# Patient Record
Sex: Male | Born: 1945 | Race: Black or African American | Hispanic: No | Marital: Married | Smoking: Former smoker
Health system: Southern US, Community
[De-identification: ages and names within clinical notes are randomized; demographics above are authoritative.]

## PROBLEM LIST (undated history)

## (undated) DIAGNOSIS — N529 Male erectile dysfunction, unspecified: Secondary | ICD-10-CM

## (undated) DIAGNOSIS — B019 Varicella without complication: Secondary | ICD-10-CM

## (undated) DIAGNOSIS — N189 Chronic kidney disease, unspecified: Secondary | ICD-10-CM

## (undated) DIAGNOSIS — G4733 Obstructive sleep apnea (adult) (pediatric): Secondary | ICD-10-CM

## (undated) DIAGNOSIS — Z974 Presence of external hearing-aid: Secondary | ICD-10-CM

## (undated) DIAGNOSIS — G4730 Sleep apnea, unspecified: Secondary | ICD-10-CM

## (undated) DIAGNOSIS — I10 Essential (primary) hypertension: Secondary | ICD-10-CM

## (undated) HISTORY — DX: Sleep apnea, unspecified: G47.30

## (undated) HISTORY — DX: Varicella without complication: B01.9

## (undated) HISTORY — PX: KNEE SURGERY: SHX244

## (undated) HISTORY — PX: SHOULDER SURGERY: SHX246

## (undated) HISTORY — DX: Chronic kidney disease, unspecified: N18.9

## (undated) HISTORY — DX: Essential (primary) hypertension: I10

---

## 2010-11-15 LAB — HM COLONOSCOPY: HM Colonoscopy: NORMAL

## 2013-04-28 ENCOUNTER — Encounter: Payer: Self-pay | Admitting: Internal Medicine

## 2013-04-28 ENCOUNTER — Ambulatory Visit (INDEPENDENT_AMBULATORY_CARE_PROVIDER_SITE_OTHER): Payer: Medicare Other | Admitting: Internal Medicine

## 2013-04-28 VITALS — BP 134/88 | HR 63 | Temp 98.3°F | Ht 71.75 in | Wt 212.0 lb

## 2013-04-28 DIAGNOSIS — Z8669 Personal history of other diseases of the nervous system and sense organs: Secondary | ICD-10-CM | POA: Insufficient documentation

## 2013-04-28 DIAGNOSIS — N529 Male erectile dysfunction, unspecified: Secondary | ICD-10-CM

## 2013-04-28 DIAGNOSIS — G43909 Migraine, unspecified, not intractable, without status migrainosus: Secondary | ICD-10-CM | POA: Insufficient documentation

## 2013-04-28 NOTE — Progress Notes (Signed)
Pre-visit discussion using our clinic review tool. No additional management support is needed unless otherwise documented below in the visit note.  

## 2013-04-28 NOTE — Progress Notes (Signed)
HPI  Pt presents to the clinic today to establish care. He recently moved from [place] and is transferring from his PCP there. He has no concerns today.  Flu: 12/2012 Tetanus: unsure of date Pneumovax: never Zostovax: 2012 Colonoscopy: 2012 Eye doctor: yearly Dentist: biannually  Past Medical History  Diagnosis Date  . Chicken pox   . Migraine     Current Outpatient Prescriptions  Medication Sig Dispense Refill  . aspirin 81 MG tablet Take 81 mg by mouth daily.      . cholecalciferol (VITAMIN D) 1000 UNITS tablet Take 1,000 Units by mouth daily.      . SUMAtriptan (IMITREX) 100 MG tablet Take 100 mg by mouth every 2 (two) hours as needed for migraine or headache. May repeat in 2 hours if headache persists or recurs.      . vardenafil (LEVITRA) 20 MG tablet Take 20 mg by mouth daily as needed for erectile dysfunction. 1/2 - 1 tablet 1 hour PRN       No current facility-administered medications for this visit.    No Known Allergies  Family History  Problem Relation Age of Onset  . Arthritis Mother   . Cancer Mother   . Hypertension Mother   . Cancer Brother     History   Social History  . Marital Status: Married    Spouse Name: N/A    Number of Children: N/A  . Years of Education: N/A   Occupational History  . Not on file.   Social History Main Topics  . Smoking status: Former Research scientist (life sciences)  . Smokeless tobacco: Never Used     Comment: Quit 1978  . Alcohol Use: No  . Drug Use: No  . Sexual Activity: Not on file   Other Topics Concern  . Not on file   Social History Narrative  . No narrative on file    ROS:  Constitutional: Denies fever, malaise, fatigue, headache or abrupt weight changes.  Respiratory: Denies difficulty breathing, shortness of breath, cough or sputum production.   Cardiovascular: Denies chest pain, chest tightness, palpitations or swelling in the hands or feet.   No other specific complaints in a complete review of systems (except as listed  in HPI above).  PE:  BP 134/88  Pulse 63  Temp(Src) 98.3 F (36.8 C) (Oral)  Ht 5' 11.75" (1.822 m)  Wt 212 lb (96.163 kg)  BMI 28.97 kg/m2  SpO2 98% Wt Readings from Last 3 Encounters:  04/28/13 212 lb (96.163 kg)    General: Appears his stated age, well developed, well nourished in NAD. Cardiovascular: Normal rate and rhythm. S1,S2 noted.  No murmur, rubs or gallops noted. No JVD or BLE edema. No carotid bruits noted. Pulmonary/Chest: Normal effort and positive vesicular breath sounds. No respiratory distress. No wheezes, rales or ronchi noted.      Assessment and Plan:   RTC in 1 month for your physical

## 2013-04-28 NOTE — Patient Instructions (Signed)
Migraine Headache A migraine headache is an intense, throbbing pain on one or both sides of your head. A migraine can last for 30 minutes to several hours. CAUSES  The exact cause of a migraine headache is not always known. However, a migraine may be caused when nerves in the brain become irritated and release chemicals that cause inflammation. This causes pain. Certain things may also trigger migraines, such as:  Alcohol.  Smoking.  Stress.  Menstruation.  Aged cheeses.  Foods or drinks that contain nitrates, glutamate, aspartame, or tyramine.  Lack of sleep.  Chocolate.  Caffeine.  Hunger.  Physical exertion.  Fatigue.  Medicines used to treat chest pain (nitroglycerine), birth control pills, estrogen, and some blood pressure medicines. SIGNS AND SYMPTOMS  Pain on one or both sides of your head.  Pulsating or throbbing pain.  Severe pain that prevents daily activities.  Pain that is aggravated by any physical activity.  Nausea, vomiting, or both.  Dizziness.  Pain with exposure to bright lights, loud noises, or activity.  General sensitivity to bright lights, loud noises, or smells. Before you get a migraine, you may get warning signs that a migraine is coming (aura). An aura may include:  Seeing flashing lights.  Seeing bright spots, halos, or zig-zag lines.  Having tunnel vision or blurred vision.  Having feelings of numbness or tingling.  Having trouble talking.  Having muscle weakness. DIAGNOSIS  A migraine headache is often diagnosed based on:  Symptoms.  Physical exam.  A CT scan or MRI of your head. These imaging tests cannot diagnose migraines, but they can help rule out other causes of headaches. TREATMENT Medicines may be given for pain and nausea. Medicines can also be given to help prevent recurrent migraines.  HOME CARE INSTRUCTIONS  Only take over-the-counter or prescription medicines for pain or discomfort as directed by your  health care provider. The use of long-term narcotics is not recommended.  Lie down in a dark, quiet room when you have a migraine.  Keep a journal to find out what may trigger your migraine headaches. For example, write down:  What you eat and drink.  How much sleep you get.  Any change to your diet or medicines.  Limit alcohol consumption.  Quit smoking if you smoke.  Get 7 9 hours of sleep, or as recommended by your health care provider.  Limit stress.  Keep lights dim if bright lights bother you and make your migraines worse. SEEK IMMEDIATE MEDICAL CARE IF:   Your migraine becomes severe.  You have a fever.  You have a stiff neck.  You have vision loss.  You have muscular weakness or loss of muscle control.  You start losing your balance or have trouble walking.  You feel faint or pass out.  You have severe symptoms that are different from your first symptoms. MAKE SURE YOU:   Understand these instructions.  Will watch your condition.  Will get help right away if you are not doing well or get worse. Document Released: 03/30/2005 Document Revised: 01/18/2013 Document Reviewed: 12/05/2012 Exit[employer] Patient Information 2014 [employer].  

## 2013-04-28 NOTE — Assessment & Plan Note (Signed)
Well controlled with Imitrex

## 2013-04-28 NOTE — Assessment & Plan Note (Signed)
On Levitra Does cause headaches with use but resolves with aleve

## 2013-05-18 ENCOUNTER — Encounter: Payer: Self-pay | Admitting: Internal Medicine

## 2013-05-18 NOTE — Progress Notes (Signed)
Eye Exam 10/10/2012

## 2013-05-26 ENCOUNTER — Other Ambulatory Visit: Payer: Self-pay

## 2013-05-26 ENCOUNTER — Other Ambulatory Visit: Payer: Self-pay | Admitting: Internal Medicine

## 2013-05-26 MED ORDER — SUMATRIPTAN SUCCINATE 100 MG PO TABS: 100.0000 mg | ORAL_TABLET | ORAL | Status: DC | PRN

## 2013-05-26 NOTE — Telephone Encounter (Signed)
rx approved

## 2013-05-26 NOTE — Telephone Encounter (Signed)
Left detailed message on VM letting pt know Rx was sent to [facility]

## 2013-05-26 NOTE — Telephone Encounter (Signed)
Pt left v/m; pt said [facility] was to request refill sumatriptan on 05/23/13 with no response; I do not see request in system. Pt is going out of town 05/28/13 and request med refilled today. Pt request cb when done.

## 2013-06-15 ENCOUNTER — Encounter: Payer: Self-pay | Admitting: Family Medicine

## 2013-06-15 ENCOUNTER — Ambulatory Visit (INDEPENDENT_AMBULATORY_CARE_PROVIDER_SITE_OTHER): Payer: Medicare Other | Admitting: Family Medicine

## 2013-06-15 ENCOUNTER — Telehealth: Payer: Self-pay

## 2013-06-15 VITALS — BP 150/108 | HR 55 | Temp 98.2°F | Ht 71.75 in | Wt 216.5 lb

## 2013-06-15 DIAGNOSIS — I10 Essential (primary) hypertension: Secondary | ICD-10-CM

## 2013-06-15 DIAGNOSIS — R809 Proteinuria, unspecified: Secondary | ICD-10-CM | POA: Insufficient documentation

## 2013-06-15 LAB — POCT URINALYSIS DIPSTICK
BILIRUBIN UA: NEGATIVE
GLUCOSE UA: NEGATIVE
Ketones, UA: NEGATIVE
Leukocytes, UA: NEGATIVE
Nitrite, UA: NEGATIVE
RBC UA: NEGATIVE
SPEC GRAV UA: 1.02
Urobilinogen, UA: 0.2
pH, UA: 6

## 2013-06-15 MED ORDER — LOSARTAN POTASSIUM-HCTZ 50-12.5 MG PO TABS: 1.0000 | ORAL_TABLET | Freq: Every day | ORAL | Status: DC

## 2013-06-15 NOTE — Progress Notes (Signed)
Pre visit review using our clinic review tool, if applicable. No additional management support is needed unless otherwise documented below in the visit note. 

## 2013-06-15 NOTE — Telephone Encounter (Signed)
pts BP was 150/105 at dentist office 06/24/13 and pt concerned; advised sometimes at dental appt BP can be elevated if nervous but scheduled appt for pt today at 4 pm with Dr Diona Browner. No unusual h/a, dizziness, blurred vision,CP or SOB. Pt will cb if condition changes or worsens prior to appt.

## 2013-06-15 NOTE — Assessment & Plan Note (Signed)
Eval creatinine ASAP in AM.

## 2013-06-15 NOTE — Assessment & Plan Note (Addendum)
New dx Will eval for secondary causes, end organ damage and risk factors. EKG: stable from 2012 RBBB UA showed trace protein.  Start lifestyle change, info given. Start losartan HCTZ daily.  Close follow up in 2 weeks.

## 2013-06-15 NOTE — Progress Notes (Signed)
   Subjective:    Patient ID: [name], male    DOB: [birth year], 68 y.o.   MRN: [id number]  Hypertension Pertinent negatives include no chest pain, palpitations or shortness of breath.     68 year old male pt of [facility] presents to office after elevated BP measured at his dentist (15/105. He has no history of HTN other than elevated at 03/2014 OV with dentist He reports  He feels well. Occ headache. No vision change. No CP, no SOB. BP Readings from Last 3 Encounters:  06/15/13 150/108  04/28/13 134/88  No recent changes in medication.  he has not been exercising enough. He has gained weight since being here. Some increase in stress. Wt Readings from Last 3 Encounters:  06/15/13 216 lb 8 oz (98.204 kg)  04/28/13 212 lb (96.163 kg)    Family history of HTN, no hx of CAD.  Review of Systems  Constitutional: Negative for fever and fatigue.  HENT: Negative for ear pain.   Eyes: Negative for pain.  Respiratory: Negative for shortness of breath.   Cardiovascular: Negative for chest pain, palpitations and leg swelling.       Objective:   Physical Exam  Constitutional: He is oriented to person, place, and time. Vital signs are normal. He appears well-developed and well-nourished.  HENT:  Head: Normocephalic.  Right Ear: Hearing normal.  Left Ear: Hearing normal.  Nose: Nose normal.  Mouth/Throat: Oropharynx is clear and moist and mucous membranes are normal.  Eyes:  No retinal changes  Neck: Trachea normal. Carotid bruit is not present. No mass and no thyromegaly present.  Cardiovascular: Normal rate, regular rhythm and normal pulses.  Exam reveals no gallop, no distant heart sounds and no friction rub.   No murmur heard. No peripheral edema  Pulmonary/Chest: Effort normal and breath sounds normal. No respiratory distress.  Neurological: He is alert and oriented to person, place, and time. He has normal reflexes.  Skin: Skin is warm, dry and intact. No rash  noted.  Psychiatric: He has a normal mood and affect. His speech is normal and behavior is normal. Thought content normal.          Assessment & Plan:

## 2013-06-15 NOTE — Patient Instructions (Addendum)
Return for fasting labs tommorow or Monday. Work on [employer] , weight loss and regular exercise Goal is weight loss about 10% current weight over 3-6 months. Start losartan/HCTZ low dose. Check blood pressure daily and record.. Goal is < 140/90. Return in 2 weeks for BP check and will have labs done to recheck kidney function on new medicaiton at that time.    Cardiac Diet This diet can help prevent heart disease and stroke. Many factors influence your heart health, including eating and exercise habits. Coronary risk rises a lot with abnormal blood fat (lipid) levels. Cardiac meal planning includes limiting unhealthy fats, increasing healthy fats, and making other small dietary changes. General guidelines are as follows:  Adjust calorie intake to reach and maintain desirable body weight.  Limit total fat intake to less than 30% of total calories. Saturated fat should be less than 7% of calories.  Saturated fats are found in animal products and in some vegetable products. Saturated vegetable fats are found in coconut oil, cocoa butter, palm oil, and palm kernel oil. Read labels carefully to avoid these products as much as possible. Use butter in moderation. Choose tub margarines and oils that have 2 grams of fat or less. Good cooking oils are canola and olive oils.  Practice low-fat cooking techniques. Do not fry food. Instead, broil, bake, boil, steam, grill, roast on a rack, stir-fry, or microwave it. Other fat reducing suggestions include:  Remove the skin from poultry.  Remove all visible fat from meats.  Skim the fat off stews, soups, and gravies before serving them.  Steam vegetables in water or broth instead of sauting them in fat.  Avoid foods with trans fat (or hydrogenated oils), such as commercially fried foods and commercially baked goods. Commercial shortening and deep-frying fats will contain trans fat.  Increase intake of fruits, vegetables, whole grains, and  legumes to replace foods high in fat.  Increase consumption of nuts, legumes, and seeds to at least 4 servings weekly. One serving of a legume equals  cup, and 1 serving of nuts or seeds equals  cup.  Choose whole grains more often. Have 3 servings per day (a serving is 1 ounce [oz]).  Eat 4 to 5 servings of vegetables per day. A serving of vegetables is 1 cup of raw leafy vegetables;  cup of raw or cooked cut-up vegetables;  cup of vegetable juice.  Eat 4 to 5 servings of fruit per day. A serving of fruit is 1 medium whole fruit;  cup of dried fruit;  cup of fresh, frozen, or canned fruit;  cup of 100% fruit juice.  Increase your intake of dietary fiber to 20 to 30 grams per day. Insoluble fiber may help lower your risk of heart disease and may help curb your appetite.  Soluble fiber binds cholesterol to be removed from the blood. Foods high in soluble fiber are dried beans, citrus fruits, oats, apples, bananas, broccoli, Brussels sprouts, and eggplant.  Try to include foods fortified with plant sterols or stanols, such as yogurt, breads, juices, or margarines. Choose several fortified foods to achieve a daily intake of 2 to 3 grams of plant sterols or stanols.  Foods with omega-3 fats can help reduce your risk of heart disease. Aim to have a 3.5 oz portion of fatty fish twice per week, such as salmon, mackerel, albacore tuna, sardines, lake trout, or herring. If you wish to take a fish oil supplement, choose one that contains 1 gram of both DHA  and EPA.  Limit processed meats to 2 servings (3 oz portion) weekly.  Limit the sodium in your diet to 1500 milligrams (mg) per day. If you have high blood pressure, talk to a registered dietitian about a DASH (Dietary Approaches to Stop Hypertension) eating plan.  Limit sweets and beverages with added sugar, such as soda, to no more than 5 servings per week. One serving is:   1 tablespoon sugar.  1 tablespoon jelly or jam.   cup  sorbet.  1 cup lemonade.   cup regular soda. CHOOSING FOODS Starches  Allowed: Breads: All kinds (wheat, rye, raisin, white, oatmeal, [place], [place], and English muffin bread). Low-fat rolls: English muffins, frankfurter and hamburger buns, bagels, pita bread, tortillas (not fried). Pancakes, waffles, biscuits, and muffins made with recommended oil.  Avoid: Products made with saturated or trans fats, oils, or whole milk products. Butter rolls, cheese breads, croissants. Commercial doughnuts, muffins, sweet rolls, biscuits, waffles, pancakes, store-bought mixes. Crackers  Allowed: Low-fat crackers and snacks: Animal, graham, rye, saltine (with recommended oil, no lard), oyster, and matzo crackers. Bread sticks, melba toast, rusks, flatbread, pretzels, and light popcorn.  Avoid: High-fat crackers: cheese crackers, butter crackers, and those made with coconut, palm oil, or trans fat (hydrogenated oils). Buttered popcorn. Cereals  Allowed: Hot or cold whole-grain cereals.  Avoid: Cereals containing coconut, hydrogenated vegetable fat, or animal fat. Potatoes / Pasta / Rice  Allowed: All kinds of potatoes, rice, and pasta (such as macaroni, spaghetti, and noodles).  Avoid: Pasta or rice prepared with cream sauce or high-fat cheese. Chow mein noodles, [place] fries. Vegetables  Allowed: All vegetables and vegetable juices.  Avoid: Fried vegetables. Vegetables in cream, butter, or high-fat cheese sauces. Limit coconut. Fruit in cream or custard. Protein  Allowed: Limit your intake of meat, seafood, and poultry to no more than 6 oz (cooked weight) per day. All lean, well-trimmed beef, veal, pork, and lamb. All chicken and [place] without skin. All fish and shellfish. Wild game: wild duck, rabbit, pheasant, and venison. Egg whites or low-cholesterol egg substitutes may be used as desired. Meatless dishes: recipes with dried beans, peas, lentils, and tofu (soybean curd). Seeds and nuts: all  seeds and most nuts.  Avoid: Prime grade and other heavily marbled and fatty meats, such as short ribs, spare ribs, rib eye roast or steak, frankfurters, sausage, bacon, and high-fat luncheon meats, mutton. Caviar. Commercially fried fish. Domestic duck, goose, venison sausage. Organ meats: liver, gizzard, heart, chitterlings, brains, kidney, sweetbreads. Dairy  Allowed: Low-fat cheeses: nonfat or low-fat cottage cheese (1% or 2% fat), cheeses made with part skim milk, such as mozzarella, farmers, string, or ricotta. (Cheeses should be labeled no more than 2 to 6 grams fat per oz.). Skim (or 1%) milk: liquid, powdered, or evaporated. Buttermilk made with low-fat milk. Drinks made with skim or low-fat milk or cocoa. Chocolate milk or cocoa made with skim or low-fat (1%) milk. Nonfat or low-fat yogurt.  Avoid: Whole milk cheeses, including col[place], cheddar, [place], [address], [place], [place], [place], [address], [place], and blue. Creamed cottage cheese, cream cheese. Whole milk and whole milk products, including buttermilk or yogurt made from whole milk, drinks made from whole milk. Condensed milk, evaporated whole milk, and 2% milk. Soups and Combination Foods  Allowed: Low-fat low-sodium soups: broth, dehydrated soups, homemade broth, soups with the fat removed, homemade cream soups made with skim or low-fat milk. Low-fat spaghetti, lasagna, chili, and Spanish rice if low-fat ingredients and low-fat cooking techniques are used.  Avoid: Cream soups made with whole milk, cream, or high-fat cheese. All other soups. Desserts and Sweets  Allowed: Sherbet, fruit ices, gelatins, meringues, and angel food cake. Homemade desserts with recommended fats, oils, and milk products. Jam, jelly, honey, marmalade, sugars, and syrups. Pure sugar candy, such as gum drops, hard candy, jelly beans, marshmallows, mints, and small amounts of dark chocolate.  Avoid: Commercially prepared cakes, pies, cookies, frosting,  pudding, or mixes for these products. Desserts containing whole milk products, chocolate, coconut, lard, palm oil, or palm kernel oil. Ice cream or ice cream drinks. Candy that contains chocolate, coconut, butter, hydrogenated fat, or unknown ingredients. Buttered syrups. Fats and Oils  Allowed: Vegetable oils: safflower, sunflower, corn, soybean, cottonseed, sesame, canola, olive, or peanut. Non-hydrogenated margarines. Salad dressing or mayonnaise: homemade or commercial, made with a recommended oil. Low or nonfat salad dressing or mayonnaise.  Limit added fats and oils to 6 to 8 tsp per day (includes fats used in cooking, baking, salads, and spreads on bread). Remember to count the "hidden fats" in foods.  Avoid: Solid fats and shortenings: butter, lard, salt pork, bacon drippings. Gravy containing meat fat, shortening, or suet. Cocoa butter, coconut. Coconut oil, palm oil, palm kernel oil, or hydrogenated oils: these ingredients are often used in bakery products, nondairy creamers, whipped toppings, candy, and commercially fried foods. Read labels carefully. Salad dressings made of unknown oils, sour cream, or cheese, such as blue cheese and [place]. Cream, all kinds: half-and-half, light, heavy, or whipping. Sour cream or cream cheese (even if "light" or low-fat). Nondairy cream substitutes: coffee creamers and sour cream substitutes made with palm, palm kernel, hydrogenated oils, or coconut oil. Beverages  Allowed: Coffee (regular or decaffeinated), tea. Diet carbonated beverages, mineral water. Alcohol: Check with your caregiver. Moderation is recommended.  Avoid: Whole milk, regular sodas, and juice drinks with added sugar. Condiments  Allowed: All seasonings and condiments. Cocoa powder. "Cream" sauces made with recommended ingredients.  Avoid: Carob powder made with hydrogenated fats. SAMPLE MENU Breakfast   cup orange juice   cup oatmeal  1 slice toast  1 tsp margarine  1  cup skim milk Lunch  [place] sandwich with 2 oz [place], 2 slices bread  Lettuce and tomato slices  Fresh fruit  Carrot sticks  Coffee or tea Snack  Fresh fruit or low-fat crackers Dinner  3 oz lean ground beef  1 baked potato  1 tsp margarine   cup asparagus  Lettuce salad  1 tbs non-creamy dressing   cup peach slices  1 cup skim milk Document Released: 01/07/2008 Document Revised: 09/29/2011 Document Reviewed: 06/23/2011 Exit[employer] Patient Information 2014 [place], [place].

## 2013-06-15 NOTE — Telephone Encounter (Signed)
Agreed -

## 2013-06-16 ENCOUNTER — Telehealth: Payer: Self-pay | Admitting: Family Medicine

## 2013-06-16 ENCOUNTER — Other Ambulatory Visit (INDEPENDENT_AMBULATORY_CARE_PROVIDER_SITE_OTHER): Payer: Medicare Other

## 2013-06-16 ENCOUNTER — Telehealth: Payer: Self-pay | Admitting: Internal Medicine

## 2013-06-16 DIAGNOSIS — N19 Unspecified kidney failure: Secondary | ICD-10-CM

## 2013-06-16 DIAGNOSIS — I10 Essential (primary) hypertension: Secondary | ICD-10-CM

## 2013-06-16 LAB — COMPREHENSIVE METABOLIC PANEL
ALT: 24 U/L (ref 0–53)
AST: 25 U/L (ref 0–37)
Albumin: 4.1 g/dL (ref 3.5–5.2)
Alkaline Phosphatase: 42 U/L (ref 39–117)
BILIRUBIN TOTAL: 1.3 mg/dL — AB (ref 0.3–1.2)
BUN: 13 mg/dL (ref 6–23)
CO2: 29 mEq/L (ref 19–32)
Calcium: 9.5 mg/dL (ref 8.4–10.5)
Chloride: 107 mEq/L (ref 96–112)
Creatinine, Ser: 1.6 mg/dL — ABNORMAL HIGH (ref 0.4–1.5)
GFR: 57.01 mL/min — ABNORMAL LOW (ref >60.00)
GLUCOSE: 85 mg/dL (ref 70–99)
POTASSIUM: 4.1 meq/L (ref 3.5–5.1)
Sodium: 142 mEq/L (ref 135–145)
TOTAL PROTEIN: 7.1 g/dL (ref 6.0–8.3)

## 2013-06-16 LAB — CBC WITH DIFFERENTIAL/PLATELET
BASOS PCT: 0.4 % (ref 0.0–3.0)
Basophils Absolute: 0 10*3/uL (ref 0.0–0.1)
EOS PCT: 1.1 % (ref 0.0–5.0)
Eosinophils Absolute: 0.1 10*3/uL (ref 0.0–0.7)
HCT: 41.8 % (ref 39.0–52.0)
Hemoglobin: 13.9 g/dL (ref 13.0–17.0)
LYMPHS PCT: 34.9 % (ref 12.0–46.0)
Lymphs Abs: 1.8 10*3/uL (ref 0.7–4.0)
MCHC: 33.3 g/dL (ref 30.0–36.0)
MCV: 90.2 fl (ref 78.0–100.0)
MONO ABS: 0.4 10*3/uL (ref 0.1–1.0)
Monocytes Relative: 7.1 % (ref 3.0–12.0)
NEUTROS PCT: 56.5 % (ref 43.0–77.0)
Neutro Abs: 2.9 10*3/uL (ref 1.4–7.7)
PLATELETS: 210 10*3/uL (ref 150.0–400.0)
RBC: 4.63 Mil/uL (ref 4.22–5.81)
RDW: 14.5 % (ref 11.5–14.6)
WBC: 5.1 10*3/uL (ref 4.5–10.5)

## 2013-06-16 LAB — LIPID PANEL
CHOL/HDL RATIO: 4
CHOLESTEROL: 168 mg/dL (ref 0–200)
HDL: 46.8 mg/dL (ref >39.00)
LDL CALC: 110 mg/dL — AB (ref 0–99)
Triglycerides: 58 mg/dL (ref 0.0–149.0)
VLDL: 11.6 mg/dL (ref 0.0–40.0)

## 2013-06-16 LAB — TSH: TSH: 1.11 u[IU]/mL (ref 0.35–5.50)

## 2013-06-16 MED ORDER — AMLODIPINE BESYLATE 5 MG PO TABS: 5.0000 mg | ORAL_TABLET | Freq: Every day | ORAL | Status: DC

## 2013-06-16 NOTE — Telephone Encounter (Signed)
Relevant patient education assigned to patient using Emmi. ° °

## 2013-06-16 NOTE — Telephone Encounter (Signed)
Called pt. New evidence of renal failure.  ? chronic vs acute Has never had kidney issues, no family history. Feeling well other than BP issues. No urine change. Stop BP medication losartan ( had only taken one dose before labs and had some protein in urine before started, doubt this is cause of kidney change)  Change to amlodipine once daily.  Follow BP at home.  Keep appt here for 2 week check BP can do labs at that time to look into issue.  We will refer you to nephrology ASAP.  Pt agreeable.

## 2013-06-17 ENCOUNTER — Telehealth: Payer: Self-pay | Admitting: Internal Medicine

## 2013-06-17 NOTE — Telephone Encounter (Signed)
Relevant patient education assigned to patient using Emmi. ° °

## 2013-07-04 ENCOUNTER — Ambulatory Visit (INDEPENDENT_AMBULATORY_CARE_PROVIDER_SITE_OTHER): Payer: Medicare Other | Admitting: Family Medicine

## 2013-07-04 ENCOUNTER — Encounter: Payer: Self-pay | Admitting: Family Medicine

## 2013-07-04 VITALS — BP 165/99 | HR 61 | Temp 98.0°F | Ht 71.75 in | Wt 211.5 lb

## 2013-07-04 DIAGNOSIS — N289 Disorder of kidney and ureter, unspecified: Secondary | ICD-10-CM

## 2013-07-04 DIAGNOSIS — N183 Chronic kidney disease, stage 3 unspecified: Secondary | ICD-10-CM | POA: Insufficient documentation

## 2013-07-04 DIAGNOSIS — I10 Essential (primary) hypertension: Secondary | ICD-10-CM

## 2013-07-04 LAB — BASIC METABOLIC PANEL
BUN: 17 mg/dL (ref 6–23)
CHLORIDE: 105 meq/L (ref 96–112)
CO2: 28 mEq/L (ref 19–32)
Calcium: 9.4 mg/dL (ref 8.4–10.5)
Creatinine, Ser: 1.5 mg/dL (ref 0.4–1.5)
GFR: 59.62 mL/min — AB (ref >60.00)
Glucose, Bld: 95 mg/dL (ref 70–99)
POTASSIUM: 4.1 meq/L (ref 3.5–5.1)
SODIUM: 139 meq/L (ref 135–145)

## 2013-07-04 NOTE — Progress Notes (Signed)
Pre visit review using our clinic review tool, if applicable. No additional management support is needed unless otherwise documented below in the visit note. 

## 2013-07-04 NOTE — Assessment & Plan Note (Signed)
Re-eval kidney function today. Referral to nephrologist.

## 2013-07-04 NOTE — Progress Notes (Signed)
   Subjective:    Patient ID: [name], male    DOB: 04-08-1946, 68 y.o.   MRN: [id number]  HPI 68 year old male with recent new diagnosis of HTN as well as newly noted renal insufficiency ( unclear acute or chronic) presents for 2 week follow up.   He was started on amlodipine 5 mg daily  at last OV. BP Readings from Last 3 Encounters:  07/04/13 165/101  06/15/13 150/108  04/28/13 134/88  At home BPs 121-145/66-92 Referral to nephrology placed. Has appt on 3/30.  Since last OV he has been feeling well. No chest pain, no swelling in ankle. Nml UOP, no blood in urine.  He has started walking 2-3 miles a day 5 days a week.  Wt Readings from Last 3 Encounters:  07/04/13 211 lb 8 oz (95.936 kg)  06/15/13 216 lb 8 oz (98.204 kg)  04/28/13 212 lb (96.163 kg)        Review of Systems     Objective:   Physical Exam  Constitutional: Vital signs are normal. He appears well-developed and well-nourished.  HENT:  Head: Normocephalic.  Right Ear: Hearing normal.  Left Ear: Hearing normal.  Nose: Nose normal.  Mouth/Throat: Oropharynx is clear and moist and mucous membranes are normal.  Neck: Trachea normal. Carotid bruit is not present. No mass and no thyromegaly present.  Cardiovascular: Normal rate, regular rhythm and normal pulses.  Exam reveals no gallop, no distant heart sounds and no friction rub.   No murmur heard. No peripheral edema  Pulmonary/Chest: Effort normal and breath sounds normal. No respiratory distress.  Skin: Skin is warm, dry and intact. No rash noted.  Psychiatric: He has a normal mood and affect. His speech is normal and behavior is normal. Thought content normal.          Assessment & Plan:

## 2013-07-04 NOTE — Patient Instructions (Signed)
Continue to follow BP... If greater than 140/90 frequently at home.. Increase BP med to 10 mg daily. Stop at lab on way out. Keep appt with kidney doctor.

## 2013-07-04 NOTE — Assessment & Plan Note (Signed)
Poor control here, moderate control at home... I recommended increasing BP to goal < 130/80, but pt refuses. Will discuss with nephrologist. Encouraged exercise, weight loss, healthy eating habits.

## 2013-07-25 ENCOUNTER — Encounter: Payer: Self-pay | Admitting: Internal Medicine

## 2013-07-25 ENCOUNTER — Ambulatory Visit (INDEPENDENT_AMBULATORY_CARE_PROVIDER_SITE_OTHER): Payer: Medicare Other | Admitting: Internal Medicine

## 2013-07-25 VITALS — BP 136/92 | HR 86 | Temp 98.3°F | Wt 211.0 lb

## 2013-07-25 DIAGNOSIS — H902 Conductive hearing loss, unspecified: Secondary | ICD-10-CM

## 2013-07-25 DIAGNOSIS — R0989 Other specified symptoms and signs involving the circulatory and respiratory systems: Secondary | ICD-10-CM

## 2013-07-25 DIAGNOSIS — H9319 Tinnitus, unspecified ear: Secondary | ICD-10-CM

## 2013-07-25 DIAGNOSIS — R0683 Snoring: Secondary | ICD-10-CM

## 2013-07-25 DIAGNOSIS — R0609 Other forms of dyspnea: Secondary | ICD-10-CM

## 2013-07-25 NOTE — Progress Notes (Signed)
Pre visit review using our clinic review tool, if applicable. No additional management support is needed unless otherwise documented below in the visit note. 

## 2013-07-25 NOTE — Progress Notes (Signed)
Subjective:    Patient ID: [name], male    DOB: [birth year], 68 y.o.   MRN: [id number]  HPI  Pt presents to the clinic today with c/o ringing in his ears. He reports this has been going on 1 year. He is concerned because the ringing is getting louder. He denies any trauma to the head or the ears. He is on Norvasc but has only been on that x 1 month. Additionally, he reports that he would like to get a sleep study. He says that his wife has noticed that he snores and that he seems to stop breathing when he sleeps. He does feel tired during the day.  Review of Systems      Past Medical History  Diagnosis Date  . Chicken pox   . Migraine     Current Outpatient Prescriptions  Medication Sig Dispense Refill  . amLODipine (NORVASC) 5 MG tablet Take 1 tablet (5 mg total) by mouth daily.  30 tablet  11  . aspirin 81 MG tablet Take 81 mg by mouth daily.      . cholecalciferol (VITAMIN D) 1000 UNITS tablet Take 1,000 Units by mouth daily.      . SUMAtriptan (IMITREX) 100 MG tablet Take 1 tablet (100 mg total) by mouth every 2 (two) hours as needed for migraine or headache. May repeat in 2 hours if headache as needed  10 tablet  0  . vardenafil (LEVITRA) 20 MG tablet Take 20 mg by mouth daily as needed for erectile dysfunction. 1/2 - 1 tablet 1 hour PRN       No current facility-administered medications for this visit.    No Known Allergies  Family History  Problem Relation Age of Onset  . Arthritis Mother   . Cancer Mother   . Hypertension Mother   . Cancer Brother     History   Social History  . Marital Status: Married    Spouse Name: N/A    Number of Children: N/A  . Years of Education: N/A   Occupational History  . Not on file.   Social History Main Topics  . Smoking status: Former Research scientist (life sciences)  . Smokeless tobacco: Never Used     Comment: Quit 1978  . Alcohol Use: No  . Drug Use: No  . Sexual Activity: Yes   Other Topics Concern  . Not on file   Social  History Narrative  . No narrative on file     Constitutional: Pt reports fatigue. Denies fever, malaise, headache or abrupt weight changes.  HEENT: Pt reports ringing in the ears. Denies eye pain, eye redness, ear pain, wax buildup, runny nose, nasal congestion, bloody nose, or sore throat. Respiratory: Pt reports apnea at night. Denies difficulty breathing, shortness of breath, cough or sputum production.     No other specific complaints in a complete review of systems (except as listed in HPI above).  Objective:   Physical Exam  BP 136/92  Pulse 86  Temp(Src) 98.3 F (36.8 C) (Oral)  Wt 211 lb (95.709 kg)  SpO2 98% Wt Readings from Last 3 Encounters:  07/25/13 211 lb (95.709 kg)  07/04/13 211 lb 8 oz (95.936 kg)  06/15/13 216 lb 8 oz (98.204 kg)    General: Appears his stated age, well developed, well nourished in NAD.  HEENT: Head: normal shape and size; Eyes: sclera white, no icterus, conjunctiva pink, PERRLA and EOMs intact; Ears: Tm's gray and intact, normal light reflex; Weber lateralizes to the left  ear, normal Rinne.   Cardiovascular: Normal rate and rhythm. S1,S2 noted.  No murmur, rubs or gallops noted. No JVD or BLE edema. No carotid bruits noted. Pulmonary/Chest: Normal effort and positive vesicular breath sounds. No respiratory distress. No wheezes, rales or ronchi noted.    BMET    Component Value Date/Time   NA 139 07/04/2013 0928   K 4.1 07/04/2013 0928   CL 105 07/04/2013 0928   CO2 28 07/04/2013 0928   GLUCOSE 95 07/04/2013 0928   BUN 17 07/04/2013 0928   CREATININE 1.5 07/04/2013 0928   CALCIUM 9.4 07/04/2013 0928    Lipid Panel     Component Value Date/Time   CHOL 168 06/16/2013 1044   TRIG 58.0 06/16/2013 1044   HDL 46.80 06/16/2013 1044   CHOLHDL 4 06/16/2013 1044   VLDL 11.6 06/16/2013 1044   LDLCALC 110* 06/16/2013 1044    CBC    Component Value Date/Time   WBC 5.1 06/16/2013 1044   RBC 4.63 06/16/2013 1044   HGB 13.9 06/16/2013 1044   HCT 41.8 06/16/2013  1044   PLT 210.0 06/16/2013 1044   MCV 90.2 06/16/2013 1044   MCHC 33.3 06/16/2013 1044   RDW 14.5 06/16/2013 1044   LYMPHSABS 1.8 06/16/2013 1044   MONOABS 0.4 06/16/2013 1044   EOSABS 0.1 06/16/2013 1044   BASOSABS 0.0 06/16/2013 1044    Hgb A1C No results found for this basename: HGBA1C         Assessment & Plan:   Conductive Hearing loss/Tinnitus:  No evidence of cerumen impaction Will refer to audiology for further evaluation   Snoring:  Will refer to pulmonology for possible sleep study  RTC as needed

## 2013-07-25 NOTE — Patient Instructions (Addendum)
Tinnitus  Sounds you hear in your ears and coming from within the ear is called tinnitus. This can be a symptom of many ear disorders. It is often associated with hearing loss.   Tinnitus can be seen with:  · Infections.  · Ear blockages such as wax buildup.  · Meniere's disease.  · Ear damage.  · Inherited.  · Occupational causes.  While irritating, it is not usually a threat to health. When the cause of the tinnitus is wax, infection in the middle ear, or foreign body it is easily treated. Hearing loss will usually be reversible.   TREATMENT   When treating the underlying cause does not get rid of tinnitus, it may be necessary to get rid of the unwanted sound by covering it up with more pleasant background noises. This may include music, the radio etc. There are tinnitus maskers which can be worn which produce background noise to cover up the tinnitus.  Avoid all medications which tend to make tinnitus worse such as alcohol, caffeine, aspirin, and nicotine. There are many soothing background tapes such as rain, ocean, thunderstorms, etc. These soothing sounds help with sleeping or resting.  Keep all follow-up appointments and referrals. This is important to identify the cause of the problem. It also helps avoid complications, impaired hearing, disability, or chronic pain.  Document Released: 03/30/2005 Document Revised: 06/22/2011 Document Reviewed: 11/16/2007  [employer]® Patient Information ©2014 [employer].

## 2013-07-26 ENCOUNTER — Encounter: Payer: Self-pay | Admitting: Internal Medicine

## 2013-08-25 ENCOUNTER — Institutional Professional Consult (permissible substitution): Payer: Medicare Other | Admitting: Pulmonary Disease

## 2013-08-31 ENCOUNTER — Ambulatory Visit (INDEPENDENT_AMBULATORY_CARE_PROVIDER_SITE_OTHER): Payer: Medicare Other | Admitting: Pulmonary Disease

## 2013-08-31 ENCOUNTER — Encounter: Payer: Self-pay | Admitting: Pulmonary Disease

## 2013-08-31 VITALS — BP 122/80 | HR 82 | Temp 98.6°F | Ht 71.75 in | Wt 214.4 lb

## 2013-08-31 DIAGNOSIS — G4733 Obstructive sleep apnea (adult) (pediatric): Secondary | ICD-10-CM

## 2013-08-31 NOTE — Patient Instructions (Signed)
Will arrange for sleep study Will call to arrange for follow up after sleep study reviewed 

## 2013-08-31 NOTE — Progress Notes (Deleted)
   Subjective:    Patient ID: [name], male    DOB: 1946-02-24, 68 y.o.   MRN: [id number]  HPI    Review of Systems  Constitutional: Negative for fever and unexpected weight change.  HENT: Negative for congestion, dental problem, ear pain, nosebleeds, postnasal drip, rhinorrhea, sinus pressure, sneezing, sore throat and trouble swallowing.   Eyes: Negative for redness and itching.  Respiratory: Negative for cough, chest tightness, shortness of breath and wheezing.   Cardiovascular: Negative for palpitations and leg swelling.  Gastrointestinal: Negative for nausea and vomiting.  Genitourinary: Negative for dysuria.  Musculoskeletal: Negative for joint swelling.  Skin: Negative for rash.  Neurological: Positive for headaches ( occasional).  Hematological: Does not bruise/bleed easily.  Psychiatric/Behavioral: Negative for dysphoric mood. The patient is not nervous/anxious.        Objective:   Physical Exam        Assessment & Plan:

## 2013-08-31 NOTE — Progress Notes (Signed)
Chief Complaint  Patient presents with  . SLEEP CONSULT    Referred by Dr Garnette Gunner. Epworth Score: 6    History of Present Illness: [name] is a 68 y.o. male for evaluation of sleep problems.  His wife have been concerned about his snoring.  He also stops breathing while asleep.  He has trouble breathing when he is asleep on his back.  He will frequently wake up in dreams.  His mouth gets dry at night.  He goes to sleep between 1030 pm and 12 am.  He falls asleep quickly.  He wakes up sometimes to use the bathroom.  He gets out of bed at 730 am.  He feels tired sometimes in the morning.  He occasionally gets morning headache.  He does not use anything to help him fall sleep or stay awake.  He can fall asleep while reading.  He denies sleep walking, sleep talking, bruxism, or nightmares.  There is no history of restless legs.  He denies sleep hallucinations, sleep paralysis, or cataplexy.  The Epworth score is 6 out of 24  Winferd Quast  has a past medical history of Chicken pox; Migraine; and HTN (hypertension).  Hobson Lax  has past surgical history that includes Knee surgery (Left) and Shoulder surgery (Left).  Prior to Admission medications   Medication Sig Start Date End Date Taking? Authorizing Provider  amLODipine (NORVASC) 5 MG tablet Take 1 tablet (5 mg total) by mouth daily. 06/16/13  Yes Amy Cletis [place], MD  aspirin 81 MG tablet Take 81 mg by mouth daily.   Yes Historical Provider, MD  cholecalciferol (VITAMIN D) 1000 UNITS tablet Take 1,000 Units by mouth daily.   Yes Historical Provider, MD  SUMAtriptan (IMITREX) 100 MG tablet Take 1 tablet (100 mg total) by mouth every 2 (two) hours as needed for migraine or headache. May repeat in 2 hours if headache as needed 05/26/13  Yes Webb Silversmith, NP  vardenafil (LEVITRA) 20 MG tablet Take 20 mg by mouth daily as needed for erectile dysfunction. 1/2 - 1 tablet 1 hour PRN   Yes Historical Provider, MD    No Known  Allergies  His family history includes Arthritis in his mother; Cancer in his mother; Colon cancer in his brother; Hypertension in his mother.  He  reports that he quit smoking about 37 years ago. His smoking use included Cigarettes. He has a 5 pack-year smoking history. He has never used smokeless tobacco. He reports that he does not drink alcohol or use illicit drugs.  Review of Systems  Constitutional: Negative for fever and unexpected weight change.  HENT: Negative for congestion, dental problem, ear pain, nosebleeds, postnasal drip, rhinorrhea, sinus pressure, sneezing, sore throat and trouble swallowing.   Eyes: Negative for redness and itching.  Respiratory: Negative for cough, chest tightness, shortness of breath and wheezing.   Cardiovascular: Negative for palpitations and leg swelling.  Gastrointestinal: Negative for nausea and vomiting.  Genitourinary: Negative for dysuria.  Musculoskeletal: Negative for joint swelling.  Skin: Negative for rash.  Neurological: Positive for headaches ( occasional).  Hematological: Does not bruise/bleed easily.  Psychiatric/Behavioral: Negative for dysphoric mood. The patient is not nervous/anxious.    Physical Exam:  General - No distress ENT - No sinus tenderness, no oral exudate, no LAN, no thyromegaly, TM clear, pupils equal/reactive, MP 3, enlarged tongue, high arched palate, decreased AP diameter Cardiac - s1s2 regular, no murmur, pulses symmetric Chest - No wheeze/rales/dullness, good air entry, normal respiratory excursion Back -  No focal tenderness Abd - Soft, non-tender, no organomegaly, + bowel sounds Ext - No edema Neuro - Normal strength, cranial nerves intact Skin - No rashes Psych - Normal mood, and behavior  Assessment/plan:  Chesley Mires, M.D. Pager [phone]

## 2013-09-16 DIAGNOSIS — G4733 Obstructive sleep apnea (adult) (pediatric): Secondary | ICD-10-CM | POA: Insufficient documentation

## 2013-09-16 NOTE — Assessment & Plan Note (Signed)
He has snoring, sleep disruption, witnessed apnea, and daytime sleepiness.  He has history of hypertension.  I am concerned he could have sleep apnea.  We discussed how sleep apnea can affect various health problems including risks for hypertension, cardiovascular disease, and diabetes.  We also discussed how sleep disruption can increase risks for accident, such as while driving.  Weight loss as a means of improving sleep apnea was also reviewed.  Additional treatment options discussed were CPAP therapy, oral appliance, and surgical intervention.  To further assess will arrange for in lab sleep study.

## 2013-09-21 ENCOUNTER — Ambulatory Visit (HOSPITAL_BASED_OUTPATIENT_CLINIC_OR_DEPARTMENT_OTHER): Payer: Medicare Other | Attending: Pulmonary Disease | Admitting: Radiology

## 2013-09-21 VITALS — Ht 71.0 in | Wt 205.0 lb

## 2013-09-21 DIAGNOSIS — R0609 Other forms of dyspnea: Secondary | ICD-10-CM | POA: Insufficient documentation

## 2013-09-21 DIAGNOSIS — I10 Essential (primary) hypertension: Secondary | ICD-10-CM | POA: Insufficient documentation

## 2013-09-21 DIAGNOSIS — Z7982 Long term (current) use of aspirin: Secondary | ICD-10-CM | POA: Insufficient documentation

## 2013-09-21 DIAGNOSIS — R0989 Other specified symptoms and signs involving the circulatory and respiratory systems: Secondary | ICD-10-CM | POA: Insufficient documentation

## 2013-09-21 DIAGNOSIS — Z79899 Other long term (current) drug therapy: Secondary | ICD-10-CM | POA: Insufficient documentation

## 2013-09-21 DIAGNOSIS — G4733 Obstructive sleep apnea (adult) (pediatric): Secondary | ICD-10-CM | POA: Insufficient documentation

## 2013-09-25 ENCOUNTER — Telehealth: Payer: Self-pay | Admitting: Pulmonary Disease

## 2013-09-25 DIAGNOSIS — G4733 Obstructive sleep apnea (adult) (pediatric): Secondary | ICD-10-CM

## 2013-09-25 NOTE — Sleep Study (Signed)
[place]  NAME: [name] DATE OF BIRTH:  [birth year] MEDICAL RECORD NUMBER [id number]  LOCATION: [facility]  PHYSICIAN: Chesley Mires, M.D. DATE OF STUDY: 09/21/2013  SLEEP STUDY TYPE: Polysomnogram               REFERRING PHYSICIAN: Chesley Mires, MD  INDICATION FOR STUDY:  [name] is a 68 y.o. male who presents to the sleep lab for evaluation of hypersomnia with obstructive sleep apnea.  He reports snoring, sleep disruption, apnea, and daytime sleepiness.  He has a history of hypertension.  EPWORTH SLEEPINESS SCORE: 5. HEIGHT: 5\' 11"  (180.3 cm)  WEIGHT: 205 lb (92.987 kg)    Body mass index is 28.6 kg/(m^2).  NECK SIZE: 15 in.  MEDICATIONS:  Current Outpatient Prescriptions on File Prior to Visit  Medication Sig Dispense Refill  . amLODipine (NORVASC) 5 MG tablet Take 1 tablet (5 mg total) by mouth daily.  30 tablet  11  . aspirin 81 MG tablet Take 81 mg by mouth daily.      . cholecalciferol (VITAMIN D) 1000 UNITS tablet Take 1,000 Units by mouth daily.      . SUMAtriptan (IMITREX) 100 MG tablet Take 1 tablet (100 mg total) by mouth every 2 (two) hours as needed for migraine or headache. May repeat in 2 hours if headache as needed  10 tablet  0  . vardenafil (LEVITRA) 20 MG tablet Take 20 mg by mouth daily as needed for erectile dysfunction. 1/2 - 1 tablet 1 hour PRN       No current facility-administered medications on file prior to visit.    SLEEP ARCHITECTURE:  Total recording time: 377.5 minutes.  Total sleep time was: 249 minutes.  Sleep efficiency: 66%.  Sleep latency: 17.5 minutes.  REM latency: 62 minutes.  Stage N1: 25.7%.  Stage N2: 64.7%.  Stage N3: 0%.  Stage R:  9.6%.  Supine sleep: 57.5 minutes.  Non-supine sleep: 191.5 minutes.  CARDIAC DATA:  Average heart rate: 57 beats per minute. Rhythm strip: sinus rhythm with sinus bradycardia.  RESPIRATORY DATA: Average respiratory rate: 16. Snoring:  loud. Average AHI: 8.9.   Apnea index: 1.7.  Hypopnea index: 1.9. Obstructive apnea index: 1.4.  Central apnea index: 0.2.  Mixed apnea index: 0. REM AHI: 17.5.  NREM AHI: 2.1. Supine AHI: 2.8. Non-supine AHI: 2.3.  MOVEMENT/PARASOMNIA:  Periodic limb movement: 0.  Period limb movements with arousals: 0. Restroom trips: two.  OXYGEN DATA:  Baseline oxygenation: 94%. Lowest SaO2: 88%. Time spent below SaO2 90%: 4.2 minutes. Supplemental oxygen used: none.  IMPRESSION/ RECOMMENDATION:   This study shows mild obstructive sleep apnea with an AHI of 8.9 and SaO2 low of 88%.  He did have a significant REM effect.  Additional therapies include weight loss, CPAP, oral appliance, or surgical evaluation.   Chesley Mires, M.D. Diplomate, [employer] adviser of Sleep Medicine  ELECTRONICALLY SIGNED ON:  09/25/2013, 2:36 PM [place] PH: [phone]   FX: [phone] [place]

## 2013-09-25 NOTE — Telephone Encounter (Signed)
PSG 09/21/13 >> AHI 8.9, SaO2 low 88%.  REM effect.   Will have my nurse schedule ROV to review results.

## 2013-09-26 NOTE — Telephone Encounter (Signed)
Spoke with the pt and scheduled appt with VS for 10/02/13 at 9 am

## 2013-10-02 ENCOUNTER — Ambulatory Visit (INDEPENDENT_AMBULATORY_CARE_PROVIDER_SITE_OTHER): Payer: Medicare Other | Admitting: Pulmonary Disease

## 2013-10-02 ENCOUNTER — Encounter: Payer: Self-pay | Admitting: Pulmonary Disease

## 2013-10-02 VITALS — BP 138/82 | HR 61 | Temp 98.5°F | Ht 71.0 in | Wt 212.6 lb

## 2013-10-02 DIAGNOSIS — G43909 Migraine, unspecified, not intractable, without status migrainosus: Secondary | ICD-10-CM

## 2013-10-02 DIAGNOSIS — G4733 Obstructive sleep apnea (adult) (pediatric): Secondary | ICD-10-CM

## 2013-10-02 NOTE — Progress Notes (Signed)
Chief Complaint  Patient presents with  . Follow-up    Review sleep study    History of Present Illness: [name] is a 68 y.o. male with OSA.  He is here to review his sleep study.  This showed mild sleep apnea.   TESTS: PSG 09/21/13 >> AHI 8.9, SaO2 low 88%. REM effect.    [name]  has a past medical history of Chicken pox; Migraine; and HTN (hypertension).  [name]  has past surgical history that includes Knee surgery (Left) and Shoulder surgery (Left).  Prior to Admission medications   Medication Sig Start Date End Date Taking? Authorizing Provider  amLODipine (NORVASC) 5 MG tablet Take 1 tablet (5 mg total) by mouth daily. 06/16/13  Yes Amy Cletis [place], MD  aspirin 81 MG tablet Take 81 mg by mouth daily.   Yes Historical Provider, MD  cholecalciferol (VITAMIN D) 1000 UNITS tablet Take 1,000 Units by mouth daily.   Yes Historical Provider, MD  SUMAtriptan (IMITREX) 100 MG tablet Take 1 tablet (100 mg total) by mouth every 2 (two) hours as needed for migraine or headache. May repeat in 2 hours if headache as needed 05/26/13  Yes Webb Silversmith, NP  vardenafil (LEVITRA) 20 MG tablet Take 20 mg by mouth daily as needed for erectile dysfunction. 1/2 - 1 tablet 1 hour PRN   Yes Historical Provider, MD    No Known Allergies   Physical Exam:  General - No distress ENT - No sinus tenderness, no oral exudate, no LAN, MP 3, enlarged tongue, high arched palate, decreased AP diameter Cardiac - s1s2 regular, no murmur Chest - No wheeze/rales/dullness Back - No focal tenderness Abd - Soft, non-tender Ext - No edema Neuro - Normal strength Skin - No rashes Psych - normal mood, and behavior   Assessment/Plan:  Chesley Mires, MD [place] [facility] Pager:  [phone]

## 2013-10-02 NOTE — Assessment & Plan Note (Signed)
He has mild sleep apnea.  I have reviewed the recent sleep study results with the patient.  We discussed how sleep apnea can affect various health problems including risks for hypertension, cardiovascular disease, and diabetes.  We also discussed how sleep disruption can increase risks for accident, such as while driving.  Weight loss as a means of improving sleep apnea was also reviewed.  Additional treatment options discussed were CPAP therapy, oral appliance, and surgical intervention.  He is interesting in trying weight loss and oral appliance.  Will arrange for referral to Dr. Oneal Grout.

## 2013-10-02 NOTE — Patient Instructions (Signed)
Will arrange for referral to Dr. Mark Katz to assess for oral appliance to treat obstructive sleep apnea  Follow up in 6 months 

## 2013-11-17 ENCOUNTER — Emergency Department (HOSPITAL_COMMUNITY): Payer: Medicare Other

## 2013-11-17 ENCOUNTER — Emergency Department (HOSPITAL_COMMUNITY)
Admission: EM | Admit: 2013-11-17 | Discharge: 2013-11-17 | Disposition: A | Discharge status: Home / Self Care | Payer: Medicare Other | Attending: Emergency Medicine | Admitting: Emergency Medicine

## 2013-11-17 ENCOUNTER — Encounter (HOSPITAL_COMMUNITY): Payer: Self-pay | Admitting: Emergency Medicine

## 2013-11-17 ENCOUNTER — Telehealth: Payer: Self-pay | Admitting: Internal Medicine

## 2013-11-17 ENCOUNTER — Emergency Department (INDEPENDENT_AMBULATORY_CARE_PROVIDER_SITE_OTHER)
Admission: EM | Admit: 2013-11-17 | Discharge: 2013-11-17 | Disposition: A | Discharge status: Other Acute Inpatient Hospital | Payer: Medicare Other | Source: Home / Self Care | Attending: Family Medicine | Admitting: Family Medicine

## 2013-11-17 ENCOUNTER — Other Ambulatory Visit: Payer: Self-pay | Admitting: Nurse Practitioner

## 2013-11-17 DIAGNOSIS — Z8619 Personal history of other infectious and parasitic diseases: Secondary | ICD-10-CM | POA: Diagnosis not present

## 2013-11-17 DIAGNOSIS — Z79899 Other long term (current) drug therapy: Secondary | ICD-10-CM | POA: Diagnosis not present

## 2013-11-17 DIAGNOSIS — Z87891 Personal history of nicotine dependence: Secondary | ICD-10-CM | POA: Insufficient documentation

## 2013-11-17 DIAGNOSIS — N529 Male erectile dysfunction, unspecified: Secondary | ICD-10-CM | POA: Diagnosis not present

## 2013-11-17 DIAGNOSIS — R0789 Other chest pain: Secondary | ICD-10-CM

## 2013-11-17 DIAGNOSIS — R079 Chest pain, unspecified: Secondary | ICD-10-CM | POA: Insufficient documentation

## 2013-11-17 DIAGNOSIS — I10 Essential (primary) hypertension: Secondary | ICD-10-CM | POA: Diagnosis not present

## 2013-11-17 DIAGNOSIS — G43909 Migraine, unspecified, not intractable, without status migrainosus: Secondary | ICD-10-CM | POA: Insufficient documentation

## 2013-11-17 DIAGNOSIS — Z7982 Long term (current) use of aspirin: Secondary | ICD-10-CM | POA: Diagnosis not present

## 2013-11-17 HISTORY — DX: Male erectile dysfunction, unspecified: N52.9

## 2013-11-17 HISTORY — DX: Obstructive sleep apnea (adult) (pediatric): G47.33

## 2013-11-17 LAB — CBC WITH DIFFERENTIAL/PLATELET
BASOS PCT: 0 % (ref 0–1)
Basophils Absolute: 0 10*3/uL (ref 0.0–0.1)
Basophils Absolute: 0 10*3/uL (ref 0.0–0.1)
Basophils Relative: 0 % (ref 0–1)
Eosinophils Absolute: 0.1 10*3/uL (ref 0.0–0.7)
Eosinophils Absolute: 0.1 10*3/uL (ref 0.0–0.7)
Eosinophils Relative: 1 % (ref 0–5)
Eosinophils Relative: 1 % (ref 0–5)
HCT: 39.2 % (ref 39.0–52.0)
HCT: 36.4 % — ABNORMAL LOW (ref 39.0–52.0)
HEMOGLOBIN: 13.4 g/dL (ref 13.0–17.0)
Hemoglobin: 12.5 g/dL — ABNORMAL LOW (ref 13.0–17.0)
Lymphocytes Relative: 20 % (ref 12–46)
Lymphocytes Relative: 24 % (ref 12–46)
Lymphs Abs: 1.3 10*3/uL (ref 0.7–4.0)
Lymphs Abs: 1.8 10*3/uL (ref 0.7–4.0)
MCH: 30.3 pg (ref 26.0–34.0)
MCH: 30 pg (ref 26.0–34.0)
MCHC: 34.2 g/dL (ref 30.0–36.0)
MCHC: 34.3 g/dL (ref 30.0–36.0)
MCV: 88.7 fL (ref 78.0–100.0)
MCV: 87.3 fL (ref 78.0–100.0)
MONO ABS: 0.5 10*3/uL (ref 0.1–1.0)
MONOS PCT: 7 % (ref 3–12)
Monocytes Absolute: 0.5 10*3/uL (ref 0.1–1.0)
Monocytes Relative: 7 % (ref 3–12)
NEUTROS PCT: 68 % (ref 43–77)
Neutro Abs: 4.8 10*3/uL (ref 1.7–7.7)
Neutro Abs: 5.1 10*3/uL (ref 1.7–7.7)
Neutrophils Relative %: 72 % (ref 43–77)
Platelets: 181 10*3/uL (ref 150–400)
Platelets: 170 10*3/uL (ref 150–400)
RBC: 4.42 MIL/uL (ref 4.22–5.81)
RBC: 4.17 MIL/uL — ABNORMAL LOW (ref 4.22–5.81)
RDW: 13.3 % (ref 11.5–15.5)
RDW: 13.2 % (ref 11.5–15.5)
WBC: 6.7 10*3/uL (ref 4.0–10.5)
WBC: 7.4 10*3/uL (ref 4.0–10.5)

## 2013-11-17 LAB — COMPREHENSIVE METABOLIC PANEL
ALBUMIN: 4 g/dL (ref 3.5–5.2)
ALT: 29 U/L (ref 0–53)
AST: 26 U/L (ref 0–37)
Alkaline Phosphatase: 58 U/L (ref 39–117)
Anion gap: 12 (ref 5–15)
BILIRUBIN TOTAL: 0.9 mg/dL (ref 0.3–1.2)
BUN: 14 mg/dL (ref 6–23)
CHLORIDE: 104 meq/L (ref 96–112)
CO2: 25 mEq/L (ref 19–32)
Calcium: 9.5 mg/dL (ref 8.4–10.5)
Creatinine, Ser: 1.49 mg/dL — ABNORMAL HIGH (ref 0.50–1.35)
GFR calc Af Amer: 54 mL/min — ABNORMAL LOW (ref >90)
GFR calc non Af Amer: 47 mL/min — ABNORMAL LOW (ref >90)
Glucose, Bld: 106 mg/dL — ABNORMAL HIGH (ref 70–99)
Potassium: 4.3 mEq/L (ref 3.7–5.3)
SODIUM: 141 meq/L (ref 137–147)
TOTAL PROTEIN: 7.5 g/dL (ref 6.0–8.3)

## 2013-11-17 LAB — I-STAT CHEM 8, ED
BUN: 14 mg/dL (ref 6–23)
CALCIUM ION: 1.26 mmol/L (ref 1.13–1.30)
Chloride: 103 mEq/L (ref 96–112)
Creatinine, Ser: 1.6 mg/dL — ABNORMAL HIGH (ref 0.50–1.35)
Glucose, Bld: 95 mg/dL (ref 70–99)
HCT: 40 % (ref 39.0–52.0)
Hemoglobin: 13.6 g/dL (ref 13.0–17.0)
Potassium: 4.4 mEq/L (ref 3.7–5.3)
Sodium: 142 mEq/L (ref 137–147)
TCO2: 25 mmol/L (ref 0–100)

## 2013-11-17 LAB — TROPONIN I: Troponin I: <0.3 ng/mL (ref <0.30)

## 2013-11-17 LAB — I-STAT TROPONIN, ED: TROPONIN I, POC: 0.01 ng/mL (ref 0.00–0.08)

## 2013-11-17 MED ORDER — GI COCKTAIL ~~LOC~~
30.0000 mL | Freq: Once | ORAL | Status: AC
  Administered 2013-11-17: 30 mL via ORAL

## 2013-11-17 MED ORDER — GI COCKTAIL ~~LOC~~
ORAL | Status: AC
  Filled 2013-11-17: qty 30

## 2013-11-17 NOTE — ED Notes (Signed)
Patient returned from X-ray 

## 2013-11-17 NOTE — ED Provider Notes (Signed)
CSN: [id number]     Arrival date & time 11/17/13  1701 History   First MD Initiated Contact with Patient 11/17/13 1713     Chief Complaint  Patient presents with  . Chest Pain     (Consider location/radiation/quality/duration/timing/severity/associated sxs/prior Treatment) The history is provided by the patient.  [name] is a 68 y.o. male hx of HTN, here with chest pain. Intermittent chest pain since yesterday. Worse with food. Lasting about 15-30 min. Took zantac and felt better. Had another episode this afternoon, went to urgent care, sent for eval. No hx of CAD or stents.    Past Medical History  Diagnosis Date  . Chicken pox   . Migraine   . HTN (hypertension)   . ED (erectile dysfunction)    Past Surgical History  Procedure Laterality Date  . Knee surgery Left     1970  . Shoulder surgery Left    Family History  Problem Relation Age of Onset  . Arthritis Mother   . Cancer Mother   . Hypertension Mother   . Colon cancer Brother    History  Substance Use Topics  . Smoking status: Former Smoker -- 0.50 packs/day for 10 years    Types: Cigarettes    Quit date: 07/12/1976  . Smokeless tobacco: Never Used     Comment: Quit 1978  . Alcohol Use: No    Review of Systems  Cardiovascular: Positive for chest pain.  All other systems reviewed and are negative.     Allergies  Review of patient's allergies indicates no known allergies.  Home Medications   Prior to Admission medications   Medication Sig Start Date End Date Taking? Authorizing Provider  acetaminophen (TYLENOL) 500 MG tablet Take 500 mg by mouth every 6 (six) hours as needed for moderate pain.   Yes Historical Provider, MD  amLODipine (NORVASC) 5 MG tablet Take 1 tablet (5 mg total) by mouth daily. 06/16/13  Yes Amy Cletis [place], MD  aspirin 81 MG tablet Take 81 mg by mouth daily.   Yes Historical Provider, MD  cholecalciferol (VITAMIN D) 1000 UNITS tablet Take 1,000 Units by mouth daily.   Yes  Historical Provider, MD  SUMAtriptan (IMITREX) 100 MG tablet Take 1 tablet (100 mg total) by mouth every 2 (two) hours as needed for migraine or headache. May repeat in 2 hours if headache as needed 05/26/13  Yes Webb Silversmith, NP  vardenafil (LEVITRA) 20 MG tablet Take 20 mg by mouth daily as needed for erectile dysfunction. 1/2 - 1 tablet 1 hour PRN   Yes Historical Provider, MD   BP 157/90  Pulse 56  Temp(Src) 98.1 F (36.7 C) (Oral)  Resp 16  Ht 5\' 11"  (1.803 m)  Wt 205 lb (92.987 kg)  BMI 28.60 kg/m2  SpO2 100% Physical Exam  Nursing note and vitals reviewed. Constitutional: He is oriented to person, place, and time. He appears well-developed and well-nourished.  NAD   HENT:  Head: Normocephalic.  Mouth/Throat: Oropharynx is clear and moist.  Eyes: Conjunctivae are normal. Pupils are equal, round, and reactive to light.  Neck: Normal range of motion. Neck supple.  Cardiovascular: Normal rate, regular rhythm and normal heart sounds.   Pulmonary/Chest: Effort normal and breath sounds normal. No respiratory distress. He has no wheezes. He has no rales.  Abdominal: Soft. Bowel sounds are normal. He exhibits no distension. There is no tenderness. There is no rebound.  Musculoskeletal: Normal range of motion. He exhibits no edema.  Neurological: He  is alert and oriented to person, place, and time. No cranial nerve deficit. Coordination normal.  Skin: Skin is warm and dry.  Psychiatric: He has a normal mood and affect. His behavior is normal. Judgment normal.    ED Course  Procedures (including critical care time) Labs Review Labs Reviewed  CBC WITH DIFFERENTIAL - Abnormal; Notable for the following:    RBC 4.17 (*)    Hemoglobin 12.5 (*)    HCT 36.4 (*)    All other components within normal limits  I-STAT TROPOININ, ED  I-STAT CHEM 8, ED    Imaging Review No results found.   EKG Interpretation   Date/Time:  Friday November 17 2013 17:14:04 EDT Ventricular Rate:  51 PR  Interval:  165 QRS Duration: 144 QT Interval:  442 QTC Calculation: 407 R Axis:   -51 Text Interpretation:  Sinus rhythm RBBB and LAFB Left ventricular  hypertrophy no changes since previous EKG earlier in the day  Confirmed by  Payne Garske  MD, Camillia Marcy ([id number]) on 11/17/2013 5:29:08 PM      MDM   Final diagnoses:  None   [name] is a 68 y.o. male here with chest pain. Likely GI, but given age and risk factors, will call cardiology.   ,7:50 PM Cardiology saw patient. Thought likely GI. Recommend d/c with outpatient f/u.      Wandra Arthurs, MD 11/17/13 [phone]

## 2013-11-17 NOTE — ED Notes (Signed)
Labs drawn, IV start attempted by this writer x 1 w/o success

## 2013-11-17 NOTE — ED Notes (Signed)
Patient transported to X-ray 

## 2013-11-17 NOTE — Consult Note (Signed)
Patient ID: [name] MRN: [id number], DOB/AGE: [birth year]   Admit date: 11/17/2013   Primary Physician: Webb Silversmith, NP Primary Cardiologist: New to  - seen by D. Darwin Guastella, MD   Pt. Profile:  68 y/o male w/o prior h/o CAD who presented to Urgent Care today with chest pain and was referred to the ED.  Problem List  Past Medical History  Diagnosis Date  . Chicken pox   . Migraine   . HTN (hypertension)     a. 11/2010 Echo: EF 55-65%.  . ED (erectile dysfunction)   . Obstructive sleep apnea   . CKD (chronic kidney disease), stage III     Past Surgical History  Procedure Laterality Date  . Knee surgery Left     1970  . Shoulder surgery Left     Allergies  No Known Allergies  HPI  68 y/o male with a prior h/o recently diagnosed HTN and OSA, CKD III (baseline creat 1.5-1.6 dating back to 06/2013),and erectile dysfxn.  He has no prior cardiac hx.  He had an echo in 2012, which showed nl LV fxn. Stress test about 5 years ago in [place] which was fine.  Walks 3 miles per day in 48 minutes with no CP/SOB. Yesterday after eating an apple and orange developed central CP going to back. The back pain lasted an hour initially and CP lasted a few seconds. Since that time has had intermittent twinges of CP and back pain but much less severe. Came to ER. Now pain free.   ECG chronic RBBB. No ST-T wave abnormalities. Trop normal. Denies GERD, nausea, vomiting, melena, BRBPR.   Home Medications  Prior to Admission medications   Medication Sig Start Date End Date Taking? Authorizing Provider  acetaminophen (TYLENOL) 500 MG tablet Take 500 mg by mouth every 6 (six) hours as needed for moderate pain.   Yes Historical Provider, MD  amLODipine (NORVASC) 5 MG tablet Take 1 tablet (5 mg total) by mouth daily. 06/16/13  Yes Amy Cletis Athens, MD  aspirin 81 MG tablet Take 81 mg by mouth daily.   Yes Historical Provider, MD  cholecalciferol (VITAMIN D) 1000 UNITS tablet Take 1,000 Units by mouth  daily.   Yes Historical Provider, MD  SUMAtriptan (IMITREX) 100 MG tablet Take 1 tablet (100 mg total) by mouth every 2 (two) hours as needed for migraine or headache. May repeat in 2 hours if headache as needed 05/26/13  Yes Webb Silversmith, NP  vardenafil (LEVITRA) 20 MG tablet Take 20 mg by mouth daily as needed for erectile dysfunction. 1/2 - 1 tablet 1 hour PRN   Yes Historical Provider, MD    Family History  Family History  Problem Relation Age of Onset  . Arthritis Mother   . Cancer Mother   . Hypertension Mother   . Colon cancer Brother     Social History  History   Social History  . Marital Status: Married    Spouse Name: N/A    Number of Children: N/A  . Years of Education: N/A   Occupational History  . retired     Adult nurse   Social History Main Topics  . Smoking status: Former Smoker -- 0.50 packs/day for 10 years    Types: Cigarettes    Quit date: 07/12/1976  . Smokeless tobacco: Never Used     Comment: Quit 1978  . Alcohol Use: No  . Drug Use: No  . Sexual Activity: Yes   Other Topics Concern  .  Not on file   Social History Narrative  . No narrative on file     Review of Systems General:  No chills, fever, night sweats or weight changes.  Cardiovascular:  No  dyspnea on exertion, edema, orthopnea, palpitations, paroxysmal nocturnal dyspnea. Dermatological: No rash, lesions/masses Respiratory: No cough, dyspnea Urologic: No hematuria, dysuria Abdominal:   No nausea, vomiting, diarrhea, bright red blood per rectum, melena, or hematemesis Neurologic:  No visual changes, wkns, changes in mental status. All other systems reviewed and are otherwise negative except as noted above.  Physical Exam  Blood pressure 157/90, pulse 56, temperature 98.1 F (36.7 C), temperature source Oral, resp. rate 16, height 5\' 11"  (1.803 m), weight 205 lb (92.987 kg), SpO2 100.00%.  General: Pleasant, NAD Psych: Normal affect. Neuro: Alert and oriented X 3. Moves all  extremities spontaneously. HEENT: Normal  Neck: Supple without bruits or JVD. Lungs:  Resp regular and unlabored, CTA. Heart: RRR no s3, s4, or murmurs. Abdomen: Soft, non-tender, non-distended, BS + x 4.  Extremities: No clubbing, cyanosis or edema. DP/PT/Radials 2+ and equal bilaterally.  Labs  Troponin [facility] of Care Test)  Recent Labs  11/17/13 1740  TROPIPOC 0.01    Recent Labs  11/17/13 1607  TROPONINI <0.30   Lab Results  Component Value Date   WBC 7.4 11/17/2013   HGB 13.6 11/17/2013   HCT 40.0 11/17/2013   MCV 87.3 11/17/2013   PLT 170 11/17/2013     Recent Labs Lab 11/17/13 1607 11/17/13 1754  NA 141 142  K 4.3 4.4  CL 104 103  CO2 25  --   BUN 14 14  CREATININE 1.49* 1.60*  CALCIUM 9.5  --   PROT 7.5  --   BILITOT 0.9  --   ALKPHOS 58  --   ALT 29  --   AST 26  --   GLUCOSE 106* 95   Lab Results  Component Value Date   CHOL 168 06/16/2013   HDL 46.80 06/16/2013   LDLCALC 110* 06/16/2013   TRIG 58.0 06/16/2013   Radiology/Studies  No results found.  ECG  SB, 55, RBBB, LAFB, LAD, LVH, inf and antlat TWI - not acutely changed.   Signed, Murray Hodgkins, NP 11/17/2013, 6:22 PM  ASSESSMENT AND PLAN 1. Chest and back pain 2. Chronic RBBB 3. HTN 4. CKD, III  Patient seen and examined independently. Emeline Gins, NP note reviewed carefully - agree with his assessment and plan. I have edited the note based on my findings.   Doubt CP is ischemic. Felt more likely to be GI in nature. ECG and trop are normal. Has good exercise tolerance without ischemic sx.  I feel we can safely send him home today and f/u with outpatient ETT. Can return to ER if worse.   Quillian Quince Lajoya Dombek,MD 6:40 PM

## 2013-11-17 NOTE — Telephone Encounter (Signed)
Patient Information:  Caller Name: Liliane Channel  Phone: [phone]  Patient: [name]  Gender: Male  DOB: March 06, 1946  Age: 68 Years  PCP: Webb Silversmith  Office Follow Up:  Does the office need to follow up with this patient?: No  Instructions For The Office: N/A  RN Note:  Sx discussed to wtih office/Blair who spoke with Dr Damita Dunnings - send patient to [facility] for check.  Will go to [facility].  Symptoms  Reason For Call & Symptoms: Episodes of chest pain/pressure started yesterday 8/6 about 24 hours ago.  Chest pain /pressure sharp like indigestion lasting 6-7 seconds at a time.  Took Zantac at [facility] but still woke during the night.  One episode this am so took another Zantac. and has had 2 in the last 30 minutes.  Pain center chest just below clavicle.  Denies sweating, difficulty breathing, denies any pain lasting as long as 5 minutes.  Reviewed Health History In EMR: Yes  Reviewed Medications In EMR: Yes  Reviewed Allergies In EMR: Yes  Reviewed Surgeries / Procedures: Yes  Date of Onset of Symptoms: 11/16/2013  Guideline(s) Used:  Chest Pain  Disposition Per Guideline:   Go to ED Now  Reason For Disposition Reached:   Severe chest pain  Advice Given:  N/A  Patient Will Follow Care Advice:  YES

## 2013-11-17 NOTE — ED Provider Notes (Signed)
CSN: [id number]     Arrival date & time 11/17/13  1457 History   First MD Initiated Contact with Patient 11/17/13 1550     Chief Complaint  Patient presents with  . Chest Pain   (Consider location/radiation/quality/duration/timing/severity/associated sxs/prior Treatment) HPI CHest pain: started 15 min after eating fruit. Radiated to back. Lasted about 30 min and then subsided. Sharp in nature. Came several other times that day during rest. Pains resolve w/o interventino. Took Zantac that night w/ some improvement. Woke up this morning and CP started again after lunch. Called PCP and told to come to UC. Last episode was 1 hr ago. Denies radiation, syncope, palpitations, SOB, HA, diaphoresis.    Past Medical History  Diagnosis Date  . Chicken pox   . Migraine   . HTN (hypertension)   . ED (erectile dysfunction)    Past Surgical History  Procedure Laterality Date  . Knee surgery Left     1970  . Shoulder surgery Left    Family History  Problem Relation Age of Onset  . Arthritis Mother   . Cancer Mother   . Hypertension Mother   . Colon cancer Brother    History  Substance Use Topics  . Smoking status: Former Smoker -- 0.50 packs/day for 10 years    Types: Cigarettes    Quit date: 07/12/1976  . Smokeless tobacco: Never Used     Comment: Quit 1978  . Alcohol Use: No    Review of Systems Per HPI with all other pertinent systems negative.   Allergies  Review of patient's allergies indicates no known allergies.  Home Medications   Prior to Admission medications   Medication Sig Start Date End Date Taking? Authorizing Provider  amLODipine (NORVASC) 5 MG tablet Take 1 tablet (5 mg total) by mouth daily. 06/16/13  Yes Amy Cletis [place], MD  aspirin 81 MG tablet Take 81 mg by mouth daily.   Yes Historical Provider, MD  cholecalciferol (VITAMIN D) 1000 UNITS tablet Take 1,000 Units by mouth daily.   Yes Historical Provider, MD  SUMAtriptan (IMITREX) 100 MG tablet Take 1 tablet (100  mg total) by mouth every 2 (two) hours as needed for migraine or headache. May repeat in 2 hours if headache as needed 05/26/13   Webb Silversmith, NP  vardenafil (LEVITRA) 20 MG tablet Take 20 mg by mouth daily as needed for erectile dysfunction. 1/2 - 1 tablet 1 hour PRN    Historical Provider, MD   BP 146/86  Pulse 60  Temp(Src) 98.8 F (37.1 C) (Oral)  Resp 16  SpO2 96% Physical Exam  Constitutional: He is oriented to person, place, and time. He appears well-developed and well-nourished. No distress.  HENT:  Head: Normocephalic and atraumatic.  Eyes: EOM are normal. Pupils are equal, round, and reactive to light.  Neck: Normal range of motion.  Cardiovascular: Normal rate, regular rhythm, normal heart sounds and intact distal pulses.   No murmur heard. Pulmonary/Chest: Effort normal and breath sounds normal. No respiratory distress. He exhibits no tenderness.  Abdominal: Soft. Bowel sounds are normal.  Musculoskeletal: Normal range of motion. He exhibits no edema and no tenderness.  Neurological: He is alert and oriented to person, place, and time. No cranial nerve deficit. He exhibits normal muscle tone. Coordination normal.  Skin: Skin is warm and dry. No rash noted. He is not diaphoretic.  Psychiatric: He has a normal mood and affect. His behavior is normal. Judgment and thought content normal.    ED Course  Procedures (including critical care time) Labs Review Labs Reviewed  TROPONIN I  COMPREHENSIVE METABOLIC PANEL  CBC WITH DIFFERENTIAL    Imaging Review No results found.   MDM   1. Chest pain, unspecified chest pain type    Unstable Angina. Possible Reflux - EKG w/ further ST depression and from previous.  Pt currently assymptomatic - Called and discussed w/ Cards Master and agree to send pt to [facility] for further evaulation.  - [facility] called and ED aware - Troponin, POC troponin, CMET, CBC, sent.  - GI cocktail given to see if this prevents further episodes.    Linna Darner, MD Family Medicine 11/17/2013, 4:48 PM       Waldemar Dickens, MD 11/17/13 [phone]

## 2013-11-17 NOTE — ED Notes (Signed)
C/o onset pain in mid chest yesterday . Since then, pain will come and go, pain radiates into back. No nausea , vomiting , sweats, SOB , or dizziness. Has been on medication x 1 month; has levitra Rx, but has not used in 1 year. States he felt some better w zantc . W/D/color good, NAD at present

## 2013-11-17 NOTE — Discharge Instructions (Signed)
Take zantac as needed.   Avoid spicy food.   Follow up with your doctor.   Return to ER if you have worsening chest pain, shortness of breath.

## 2013-11-17 NOTE — ED Notes (Signed)
Pt in from UC via [facility], pt c/o intermittent CP on mid subclavical area on L side of chest that radiates into back, lasting 5 secs at a time, last pain reported today @ 14:00, denies SOB, nv/v/d, denies injury to the area, pt A&O x4, follows commands, speaks in complete sentences

## 2013-11-24 ENCOUNTER — Encounter: Payer: Self-pay | Admitting: Internal Medicine

## 2013-11-24 ENCOUNTER — Ambulatory Visit (INDEPENDENT_AMBULATORY_CARE_PROVIDER_SITE_OTHER): Payer: Medicare Other | Admitting: Internal Medicine

## 2013-11-24 VITALS — BP 138/92 | HR 70 | Temp 99.0°F | Ht 71.0 in | Wt 206.0 lb

## 2013-11-24 DIAGNOSIS — B9789 Other viral agents as the cause of diseases classified elsewhere: Principal | ICD-10-CM

## 2013-11-24 DIAGNOSIS — J069 Acute upper respiratory infection, unspecified: Secondary | ICD-10-CM

## 2013-11-24 MED ORDER — HYDROCODONE-HOMATROPINE 5-1.5 MG/5ML PO SYRP: 5.0000 mL | ORAL_SOLUTION | Freq: Three times a day (TID) | ORAL | Status: DC | PRN

## 2013-11-24 NOTE — Progress Notes (Signed)
HPI  Pt presents to the clinic today with c/o runny nose, sore throat and cough. He reports this started 5 days ago. Most of his symptoms have resolved but the cough still lingers. It is non productive. He denies fever, chills or body aches. He has tried tylenol and hot tea with some relief. He has not had sick contacts that he is aware of. He denies history of seasonal allergies.  Review of Systems      Past Medical History  Diagnosis Date  . Chicken pox   . Migraine   . HTN (hypertension)     a. 11/2010 Echo: EF 55-65%.  . ED (erectile dysfunction)   . Obstructive sleep apnea   . CKD (chronic kidney disease), stage III     Family History  Problem Relation Age of Onset  . Arthritis Mother   . Cancer Mother   . Hypertension Mother   . Colon cancer Brother     History   Social History  . Marital Status: Married    Spouse Name: N/A    Number of Children: N/A  . Years of Education: N/A   Occupational History  . retired     Adult nurse   Social History Main Topics  . Smoking status: Former Smoker -- 0.50 packs/day for 10 years    Types: Cigarettes    Quit date: 07/12/1976  . Smokeless tobacco: Never Used     Comment: Quit 1978  . Alcohol Use: No  . Drug Use: No  . Sexual Activity: Yes   Other Topics Concern  . Not on file   Social History Narrative  . No narrative on file    No Known Allergies   Constitutional: Positive headache. Denies fatigue, fever or abrupt weight changes.  HEENT:  Positive runny nose, sore throat. Denies eye redness, eye pain, pressure behind the eyes, facial pain, nasal congestion, ear pain, ringing in the ears, wax buildup or bloody nose. Respiratory: Positive cough. Denies difficulty breathing or shortness of breath.  Cardiovascular: Denies chest pain, chest tightness, palpitations or swelling in the hands or feet.   No other specific complaints in a complete review of systems (except as listed in HPI above).  Objective:   BP 138/92   Pulse 70  Temp(Src) 99 F (37.2 C) (Oral)  Ht 5\' 11"  (1.803 m)  Wt 206 lb (93.441 kg)  BMI 28.74 kg/m2  SpO2 97%  Wt Readings from Last 3 Encounters:  11/17/13 205 lb (92.987 kg)  10/02/13 212 lb 9.6 oz (96.435 kg)  09/21/13 205 lb (92.987 kg)     General: Appears his stated age, well developed, well nourished in NAD. HEENT: Head: normal shape and size; Eyes: sclera white, no icterus, conjunctiva pink; Ears: Tm's gray and intact, normal light reflex; Nose: mucosa pink and moist, septum midline; Throat/Mouth: Teeth present, mucosa erythematous and moist, no exudate noted, no lesions or ulcerations noted.  Neck: Mild cervical lymphadenopathy. Neck supple, trachea midline. No massses, lumps or thyromegaly present.  Cardiovascular: Normal rate and rhythm. S1,S2 noted.  No murmur, rubs or gallops noted. No JVD or BLE edema. No carotid bruits noted. Pulmonary/Chest: Normal effort and positive vesicular breath sounds. No respiratory distress. No wheezes, rales or ronchi noted.      Assessment & Plan:   Upper Respiratory Infection:  Get some rest and drink plenty of water Do salt water gargles for the sore throat eRx for Hycodan cough syrup Continue tylenol as needed  RTC as needed or if symptoms  persist.

## 2013-11-24 NOTE — Patient Instructions (Addendum)
Cough, Adult  A cough is a reflex that helps clear your throat and airways. It can help heal the body or may be a reaction to an irritated airway. A cough may only last 2 or 3 weeks (acute) or may last more than 8 weeks (chronic).  CAUSES Acute cough:  Viral or bacterial infections. Chronic cough:  Infections.  Allergies.  Asthma.  Post-nasal drip.  Smoking.  Heartburn or acid reflux.  Some medicines.  Chronic lung problems (COPD).  Cancer. SYMPTOMS   Cough.  Fever.  Chest pain.  Increased breathing rate.  High-pitched whistling sound when breathing (wheezing).  Colored mucus that you cough up (sputum). TREATMENT   A bacterial cough may be treated with antibiotic medicine.  A viral cough must run its course and will not respond to antibiotics.  Your caregiver may recommend other treatments if you have a chronic cough. HOME CARE INSTRUCTIONS   Only take over-the-counter or prescription medicines for pain, discomfort, or fever as directed by your caregiver. Use cough suppressants only as directed by your caregiver.  Use a cold steam vaporizer or humidifier in your bedroom or home to help loosen secretions.  Sleep in a semi-upright position if your cough is worse at night.  Rest as needed.  Stop smoking if you smoke. SEEK IMMEDIATE MEDICAL CARE IF:   You have pus in your sputum.  Your cough starts to worsen.  You cannot control your cough with suppressants and are losing sleep.  You begin coughing up blood.  You have difficulty breathing.  You develop pain which is getting worse or is uncontrolled with medicine.  You have a fever. MAKE SURE YOU:   Understand these instructions.  Will watch your condition.  Will get help right away if you are not doing well or get worse. Document Released: 09/26/2010 Document Revised: 06/22/2011 Document Reviewed: 09/26/2010 Exit[employer] Patient Information 2015 [employer]. This information is not intended  to replace advice given to you by your health care provider. Make sure you discuss any questions you have with your health care provider.  

## 2013-11-25 ENCOUNTER — Other Ambulatory Visit: Payer: Self-pay | Admitting: Internal Medicine

## 2013-11-28 ENCOUNTER — Encounter (HOSPITAL_COMMUNITY): Payer: Medicare Other

## 2013-11-28 NOTE — Telephone Encounter (Signed)
Last filled 05/2013--last OV 07/2013--please advise

## 2013-12-04 ENCOUNTER — Encounter (HOSPITAL_COMMUNITY): Payer: Medicare Other

## 2013-12-06 ENCOUNTER — Telehealth: Payer: Self-pay

## 2013-12-06 NOTE — Telephone Encounter (Signed)
Pt was seen on 11/24/13; pt continues with prod cough with clear phlegm at night, left side of throat is sore. Pt request antibiotic; No head congestion, wheezing, SOB or fever. Pt scheduled appt 12/07/13 at 9:15.

## 2013-12-07 ENCOUNTER — Encounter: Payer: Self-pay | Admitting: Internal Medicine

## 2013-12-07 ENCOUNTER — Ambulatory Visit (INDEPENDENT_AMBULATORY_CARE_PROVIDER_SITE_OTHER): Payer: Medicare Other | Admitting: Internal Medicine

## 2013-12-07 VITALS — BP 118/72 | HR 59 | Temp 98.0°F | Wt 204.0 lb

## 2013-12-07 DIAGNOSIS — J069 Acute upper respiratory infection, unspecified: Secondary | ICD-10-CM

## 2013-12-07 DIAGNOSIS — B9789 Other viral agents as the cause of diseases classified elsewhere: Principal | ICD-10-CM

## 2013-12-07 NOTE — Progress Notes (Signed)
Pre visit review using our clinic review tool, if applicable. No additional management support is needed unless otherwise documented below in the visit note. 

## 2013-12-07 NOTE — Progress Notes (Signed)
HPI  Pt presents to the clinic today with c/o continued cough and sore throat going on for > 2 weeks. His main concern was the he had a swollen lymph node on the left side of his neck. It was tender. All of his symptoms have resolved as of this morning. He was seen for the same 11/24/13. It was though that he had a viral URI. He was treated with cough syrup. The symptoms have persisted. He has no history of seasonal allergies. He has not had sick contacts.  Review of Systems      Past Medical History  Diagnosis Date  . Chicken pox   . Migraine   . HTN (hypertension)     a. 11/2010 Echo: EF 55-65%.  . ED (erectile dysfunction)   . Obstructive sleep apnea   . CKD (chronic kidney disease), stage III     Family History  Problem Relation Age of Onset  . Arthritis Mother   . Cancer Mother   . Hypertension Mother   . Colon cancer Brother     History   Social History  . Marital Status: Married    Spouse Name: N/A    Number of Children: N/A  . Years of Education: N/A   Occupational History  . retired     Adult nurse   Social History Main Topics  . Smoking status: Former Smoker -- 0.50 packs/day for 10 years    Types: Cigarettes    Quit date: 07/12/1976  . Smokeless tobacco: Never Used     Comment: Quit 1978  . Alcohol Use: No  . Drug Use: No  . Sexual Activity: Yes   Other Topics Concern  . Not on file   Social History Narrative  . No narrative on file    No Known Allergies   Constitutional:  Denies headache, fatigue, fever or abrupt weight changes.  HEENT:  Positive sore throat. Denies eye redness, eye pain, pressure behind the eyes, facial pain, nasal congestion, ear pain, ringing in the ears, wax buildup, runny nose or bloody nose. Respiratory: Positive cough. Denies difficulty breathing or shortness of breath.  Cardiovascular: Denies chest pain, chest tightness, palpitations or swelling in the hands or feet.   No other specific complaints in a complete review of  systems (except as listed in HPI above).  Objective:   BP 118/72  Pulse 59  Temp(Src) 98 F (36.7 C) (Oral)  Wt 204 lb (92.534 kg)  SpO2 98%  Wt Readings from Last 3 Encounters:  12/07/13 204 lb (92.534 kg)  11/24/13 206 lb (93.441 kg)  11/17/13 205 lb (92.987 kg)     General: Appears his stated age, well developed, well nourished in NAD. HEENT: Ears: Tm's gray and intact, normal light reflex; Nose: mucosa pink and moist, septum midline; Throat/Mouth:  Teeth present, mucosa erythematous and moist, no exudate noted, no lesions or ulcerations noted.  Cardiovascular: Normal rate and rhythm. S1,S2 noted.  No murmur, rubs or gallops noted. No JVD or BLE edema. No carotid bruits noted. Pulmonary/Chest: Normal effort and positive vesicular breath sounds. No respiratory distress. No wheezes, rales or ronchi noted.      Assessment & Plan:    Viral Upper Respiratory Infection, resolved:  Symptoms have resolved as of this am Continue to monitor for now No need for abx at this time  RTC as needed or if symptoms persist.

## 2013-12-07 NOTE — Patient Instructions (Signed)

## 2013-12-11 ENCOUNTER — Encounter (HOSPITAL_COMMUNITY): Payer: Medicare Other

## 2014-02-28 ENCOUNTER — Telehealth (HOSPITAL_BASED_OUTPATIENT_CLINIC_OR_DEPARTMENT_OTHER): Payer: Self-pay | Admitting: Emergency Medicine

## 2014-03-13 ENCOUNTER — Ambulatory Visit (HOSPITAL_COMMUNITY): Payer: Medicare Other | Attending: Cardiology | Admitting: Radiology

## 2014-03-13 DIAGNOSIS — R079 Chest pain, unspecified: Secondary | ICD-10-CM | POA: Insufficient documentation

## 2014-03-13 DIAGNOSIS — R0789 Other chest pain: Secondary | ICD-10-CM

## 2014-03-13 MED ORDER — TECHNETIUM TC 99M SESTAMIBI GENERIC - CARDIOLITE
30.0000 | Freq: Once | INTRAVENOUS | Status: AC | PRN
  Administered 2014-03-13: 30 via INTRAVENOUS

## 2014-03-13 MED ORDER — TECHNETIUM TC 99M SESTAMIBI GENERIC - CARDIOLITE
10.0000 | Freq: Once | INTRAVENOUS | Status: AC | PRN
  Administered 2014-03-13: 10 via INTRAVENOUS

## 2014-03-13 NOTE — Progress Notes (Signed)
[facility] [address] [place], [place] [address] [phone]    Cardiology Nuclear Med Study  [name] is a 68 y.o. male     MRN : [id number]     DOB: 03/09/1946  Procedure Date: 03/13/2014  Nuclear Med Background Indication for Stress Test:  Evaluation for Ischemia History:  No known CAD Cardiac Risk Factors: Hypertension and RBBB  Symptoms:  Chest Pain (last date of chest discomfort was in August)   Nuclear Pre-Procedure Caffeine/Decaff Intake:  None> 12 hrs NPO After: 7:00pm   Lungs:  clear O2 Sat: 98% on room air. IV 0.9% NS with Angio Cath:  22g  IV Site: R Forearm x 1, tolerated well IV Started by:  Irven Baltimore, RN  Chest Size (in):  42 Cup Size: n/a  Height: 5\' 11"  (1.803 m)  Weight:  204 lb (92.534 kg)  BMI:  Body mass index is 28.46 kg/(m^2). Tech Comments:  Norvasc taken last night. Irven Baltimore, RN.    Nuclear Med Study 1 or 2 day study: 1 day  Stress Test Type:  Stress  Reading MD: N/A  Order Authorizing Provider:  Glori Bickers, MD  Resting Radionuclide: Technetium 36m Sestamibi  Resting Radionuclide Dose: 11.0 mCi   Stress Radionuclide:  Technetium 60m Sestamibi  Stress Radionuclide Dose: 33.0 mCi           Stress Protocol Rest HR: 47 Stress HR: 146  Rest BP: 141/88 Stress BP: 202/83  Exercise Time (min): 10:00 METS: 11.7   Predicted Max HR: 153 bpm % Max HR: 95.42 bpm Rate Pressure Product: 54627   Dose of Adenosine (mg):  n/a Dose of Lexiscan: n/a mg  Dose of Atropine (mg): n/a Dose of Dobutamine: n/a mcg/kg/min (at max HR)  Stress Test Technologist: Glade Lloyd, BS-ES  Nuclear Technologist:  Earl Many, CNMT     Rest Procedure:  Myocardial perfusion imaging was performed at rest 45 minutes following the intravenous administration of Technetium 37m Sestamibi. Rest ECG: NSR-RBBB  Stress Procedure:  The patient exercised on the treadmill utilizing the Bruce Protocol for 10:00 minutes. The  patient stopped due to fatigue and denied any chest pain.  Technetium 27m Sestamibi was injected at peak exercise and myocardial perfusion imaging was performed after a brief delay. Stress ECG: No significant change from baseline ECG  QPS Raw Data Images:  Normal; no motion artifact; normal heart/lung ratio. Stress Images:  Normal homogeneous uptake in all areas of the myocardium. Rest Images:  Normal homogeneous uptake in all areas of the myocardium. Subtraction (SDS):  No evidence of ischemia. Transient Ischemic Dilatation (Normal <1.22):  1.00 Lung/Heart Ratio (Normal <0.45):  0.25  Quantitative Gated Spect Images QGS EDV:  117 ml QGS ESV:  45 ml  Impression Exercise Capacity:  Good exercise capacity. BP Response:  Normal blood pressure response. Clinical Symptoms:  No significant symptoms noted. ECG Impression:  No significant ST segment change suggestive of ischemia. Comparison with Prior Nuclear Study: No previous nuclear study performed  Overall Impression:  Normal stress nuclear study.  LV Ejection Fraction: 61%.  LV Wall Motion:  NL LV Function; NL Wall Motion  Darlin Coco MD

## 2014-03-16 NOTE — Telephone Encounter (Signed)
-----   Message from Rogelia Mire, NP sent at 03/16/2014 12:24 PM EST ----- Normal/low risk myoview.  Normal LV function.  No need for further ischemic w/u.  F/U with PCP as previously scheduled.

## 2014-03-16 NOTE — Telephone Encounter (Signed)
Called patient about normal results of myoview. Informed patient to follow up with PCP as scheduled. Patient verbalized understanding.  Ewell Poe RN

## 2014-03-27 ENCOUNTER — Telehealth: Payer: Self-pay

## 2014-03-27 ENCOUNTER — Other Ambulatory Visit: Payer: Self-pay | Admitting: Internal Medicine

## 2014-03-27 MED ORDER — VARDENAFIL HCL 20 MG PO TABS: 20.0000 mg | ORAL_TABLET | Freq: Every day | ORAL | Status: DC | PRN

## 2014-03-27 NOTE — Telephone Encounter (Signed)
Was seen 04/2013 for the same.  Will send in [place] He will need yearly follow up in January

## 2014-03-27 NOTE — Telephone Encounter (Signed)
Pt left v/m requesting refill levitra to [facility]; Webb Silversmith NP has not prescribed before; pt last seen sick visit 07/25/13. No future appt scheduled. Does pt need appt?

## 2014-03-30 NOTE — Telephone Encounter (Signed)
Per Lollie Marrow, pt is aware and has made appt for 04/2014

## 2014-04-26 ENCOUNTER — Ambulatory Visit (INDEPENDENT_AMBULATORY_CARE_PROVIDER_SITE_OTHER): Payer: Commercial Managed Care - HMO | Admitting: Internal Medicine

## 2014-04-26 ENCOUNTER — Encounter: Payer: Self-pay | Admitting: Internal Medicine

## 2014-04-26 VITALS — BP 130/94 | HR 59 | Temp 97.9°F | Ht 71.0 in | Wt 201.0 lb

## 2014-04-26 DIAGNOSIS — Z800 Family history of malignant neoplasm of digestive organs: Secondary | ICD-10-CM

## 2014-04-26 DIAGNOSIS — Z125 Encounter for screening for malignant neoplasm of prostate: Secondary | ICD-10-CM

## 2014-04-26 DIAGNOSIS — E010 Iodine-deficiency related diffuse (endemic) goiter: Secondary | ICD-10-CM

## 2014-04-26 DIAGNOSIS — Z0000 Encounter for general adult medical examination without abnormal findings: Secondary | ICD-10-CM

## 2014-04-26 DIAGNOSIS — E049 Nontoxic goiter, unspecified: Secondary | ICD-10-CM

## 2014-04-26 DIAGNOSIS — Z1211 Encounter for screening for malignant neoplasm of colon: Secondary | ICD-10-CM

## 2014-04-26 DIAGNOSIS — I10 Essential (primary) hypertension: Secondary | ICD-10-CM

## 2014-04-26 LAB — COMPREHENSIVE METABOLIC PANEL
ALK PHOS: 52 U/L (ref 39–117)
ALT: 18 U/L (ref 0–53)
AST: 21 U/L (ref 0–37)
Albumin: 4.2 g/dL (ref 3.5–5.2)
BILIRUBIN TOTAL: 1.1 mg/dL (ref 0.2–1.2)
BUN: 16 mg/dL (ref 6–23)
CHLORIDE: 106 meq/L (ref 96–112)
CO2: 28 mEq/L (ref 19–32)
Calcium: 9.2 mg/dL (ref 8.4–10.5)
Creatinine, Ser: 1.39 mg/dL (ref 0.40–1.50)
GFR: 65.44 mL/min (ref >60.00)
Glucose, Bld: 92 mg/dL (ref 70–99)
Potassium: 4.1 mEq/L (ref 3.5–5.1)
Sodium: 139 mEq/L (ref 135–145)
Total Protein: 7.2 g/dL (ref 6.0–8.3)

## 2014-04-26 LAB — LIPID PANEL
Cholesterol: 160 mg/dL (ref 0–200)
HDL: 44.9 mg/dL (ref >39.00)
LDL Cholesterol: 101 mg/dL — ABNORMAL HIGH (ref 0–99)
NONHDL: 115.1
Total CHOL/HDL Ratio: 4
Triglycerides: 72 mg/dL (ref 0.0–149.0)
VLDL: 14.4 mg/dL (ref 0.0–40.0)

## 2014-04-26 LAB — CBC
HEMATOCRIT: 40.3 % (ref 39.0–52.0)
HEMOGLOBIN: 13.5 g/dL (ref 13.0–17.0)
MCHC: 33.6 g/dL (ref 30.0–36.0)
MCV: 89.7 fl (ref 78.0–100.0)
Platelets: 209 10*3/uL (ref 150.0–400.0)
RBC: 4.49 Mil/uL (ref 4.22–5.81)
RDW: 14.8 % (ref 11.5–15.5)
WBC: 4.3 10*3/uL (ref 4.0–10.5)

## 2014-04-26 LAB — PSA, MEDICARE: PSA: 0.99 ng/mL (ref 0.10–4.00)

## 2014-04-26 LAB — TSH: TSH: 1.03 u[IU]/mL (ref 0.35–4.50)

## 2014-04-26 NOTE — Patient Instructions (Signed)

## 2014-04-26 NOTE — Progress Notes (Signed)
HPI:  Pt presents to the clinic today for his medicare wellness exam.    Past Medical History  Diagnosis Date  . Chicken pox   . Migraine   . HTN (hypertension)     a. 11/2010 Echo: EF 55-65%.  . ED (erectile dysfunction)   . Obstructive sleep apnea   . CKD (chronic kidney disease), stage III     Current Outpatient Prescriptions  Medication Sig Dispense Refill  . acetaminophen (TYLENOL) 500 MG tablet Take 500 mg by mouth every 6 (six) hours as needed for moderate pain.    Marland Kitchen amLODipine (NORVASC) 5 MG tablet Take 1 tablet (5 mg total) by mouth daily. 30 tablet 11  . aspirin 81 MG tablet Take 81 mg by mouth daily.    . cholecalciferol (VITAMIN D) 1000 UNITS tablet Take 1,000 Units by mouth daily.    . SUMAtriptan (IMITREX) 100 MG tablet TAKE 1/2 TO 1 TABLET BY MOUTH FOR MIGRAINE.MAY REPEAT 2 HOURS LATER. NO MORE THAN 200MG /DAY 9 tablet 0  . vardenafil (LEVITRA) 20 MG tablet Take 1 tablet (20 mg total) by mouth daily as needed for erectile dysfunction. 1/2 - 1 tablet 1 hour PRN 10 tablet 0   No current facility-administered medications for this visit.    No Known Allergies  Family History  Problem Relation Age of Onset  . Arthritis Mother   . Cancer Mother   . Hypertension Mother   . Colon cancer Brother     History   Social History  . Marital Status: Married    Spouse Name: N/A    Number of Children: N/A  . Years of Education: N/A   Occupational History  . retired     Adult nurse   Social History Main Topics  . Smoking status: Former Smoker -- 0.50 packs/day for 10 years    Types: Cigarettes    Quit date: 07/12/1976  . Smokeless tobacco: Never Used     Comment: Quit 1978  . Alcohol Use: No  . Drug Use: No  . Sexual Activity: Yes   Other Topics Concern  . Not on file   Social History Narrative    Hospitiliaztions: None  Health Maintenance:    Flu: 01/2014  Tetanus: 2011  Pneumovax: 2014  Zostavax: 2009  PSA Screening: 2014  Colonoscopy: 2012 (4  years)  Eye Doctor: annually  Dental Exam: biannually  List of Providers:  PCP: Webb Silversmith, NP-C Opthomalogist: [place]: Nephrologist: Dr. Halford Chessman  I have personally reviewed and have noted:  1. The patient's medical and social history 2. Their use of alcohol, tobacco or illicit drugs 3. Their current medications and supplements 4. The patient's functional ability including ADL's, fall risks, home  safety risks and hearing or visual impairment. 5. Diet and physical activities 6. Evidence for depression or mood disorder  Subjective:   Review of Systems:   Constitutional: Denies fever, malaise, fatigue, headache or abrupt weight changes.  HEENT: Denies eye pain, eye redness, ear pain, ringing in the ears, wax buildup, runny nose, nasal congestion, bloody nose, or sore throat. Respiratory: Denies difficulty breathing, shortness of breath, cough or sputum production.   Cardiovascular: Denies chest pain, chest tightness, palpitations or swelling in the hands or feet.  Gastrointestinal: Denies abdominal pain, bloating, constipation, diarrhea or blood in the stool.  GU: Denies urgency, frequency, pain with urination, burning sensation, blood in urine, odor or discharge. Musculoskeletal: Denies decrease in range of motion, difficulty with gait, muscle pain or joint pain and swelling.  Skin: Denies redness, rashes, lesions or ulcercations.  Neurological: Denies dizziness, difficulty with memory, difficulty with speech or problems with balance and coordination.   No other specific complaints in a complete review of systems (except as listed in HPI above).  Objective:  PE:   BP 130/94 mmHg  Pulse 59  Temp(Src) 97.9 F (36.6 C) (Oral)  Ht 5\' 11"  (1.803 m)  Wt 201 lb (91.173 kg)  BMI 28.05 kg/m2  SpO2 98% Wt Readings from Last 3 Encounters:  04/26/14 201 lb (91.173 kg)  03/13/14 204 lb (92.534 kg)  12/07/13 204 lb (92.534 kg)    General: Appears his stated age, well  developed, well nourished in NAD. HEENT: Thyromegaly noted, no masses or lumps noted. Cardiovascular: Normal rate and rhythm. S1,S2 noted.  No murmur, rubs or gallops noted. No JVD or BLE edema. No carotid bruits noted. Pulmonary/Chest: Normal effort and positive vesicular breath sounds. No respiratory distress. No wheezes, rales or ronchi noted.  Neurological: Alert and oriented.    BMET    Component Value Date/Time   NA 142 11/17/2013 1754   K 4.4 11/17/2013 1754   CL 103 11/17/2013 1754   CO2 25 11/17/2013 1607   GLUCOSE 95 11/17/2013 1754   BUN 14 11/17/2013 1754   CREATININE 1.60* 11/17/2013 1754   CALCIUM 9.5 11/17/2013 1607   GFRNONAA 47* 11/17/2013 1607   GFRAA 54* 11/17/2013 1607    Lipid Panel     Component Value Date/Time   CHOL 168 06/16/2013 1044   TRIG 58.0 06/16/2013 1044   HDL 46.80 06/16/2013 1044   CHOLHDL 4 06/16/2013 1044   VLDL 11.6 06/16/2013 1044   LDLCALC 110* 06/16/2013 1044    CBC    Component Value Date/Time   WBC 7.4 11/17/2013 1715   RBC 4.17* 11/17/2013 1715   HGB 13.6 11/17/2013 1754   HCT 40.0 11/17/2013 1754   PLT 170 11/17/2013 1715   MCV 87.3 11/17/2013 1715   MCH 30.0 11/17/2013 1715   MCHC 34.3 11/17/2013 1715   RDW 13.2 11/17/2013 1715   LYMPHSABS 1.8 11/17/2013 1715   MONOABS 0.5 11/17/2013 1715   EOSABS 0.1 11/17/2013 1715   BASOSABS 0.0 11/17/2013 1715    Hgb A1C No results found for: HGBA1C    Assessment and Plan:   Medicare Annual Wellness Visit:  Diet: Does not follow a specific diet Physical activity: Walking 4-5 times per week Depression/mood screen: Negative Hearing: Intact to whispered voice Visual acuity: Grossly normal, performs annual eye exam  ADLs: Capable Fall risk: None Home safety: Good Cognitive evaluation: Intact to orientation, naming, recall and repetition EOL planning: No adv directives,- will give info today, full code/ I agree  Preventative Medicine:  Will check CBC, CMET, Lipid,  PSA Will refer to GI for screening colonoscopy  Next appointment:  6 months- follow up HTN

## 2014-05-07 ENCOUNTER — Ambulatory Visit: Payer: Medicare Other | Admitting: Pulmonary Disease

## 2014-05-07 ENCOUNTER — Encounter: Payer: Self-pay | Admitting: Internal Medicine

## 2014-05-23 ENCOUNTER — Telehealth: Payer: Self-pay | Admitting: Internal Medicine

## 2014-05-23 NOTE — Telephone Encounter (Signed)
After receiving previous Colonoscopy report [facility] revir=ewed and said patient could wait until September 2017. Called the patient and he will call [facility] to schedule his Colonoscopy. Cancelling this referral at this time.

## 2014-06-17 ENCOUNTER — Other Ambulatory Visit: Payer: Self-pay | Admitting: Family Medicine

## 2014-08-02 ENCOUNTER — Ambulatory Visit (INDEPENDENT_AMBULATORY_CARE_PROVIDER_SITE_OTHER): Payer: Commercial Managed Care - HMO | Admitting: Internal Medicine

## 2014-08-02 ENCOUNTER — Encounter: Payer: Self-pay | Admitting: Internal Medicine

## 2014-08-02 VITALS — BP 136/80 | HR 82 | Temp 100.0°F | Wt 206.0 lb

## 2014-08-02 DIAGNOSIS — R109 Unspecified abdominal pain: Secondary | ICD-10-CM

## 2014-08-02 MED ORDER — HYDROCODONE-ACETAMINOPHEN 5-325 MG PO TABS: 1.0000 | ORAL_TABLET | Freq: Four times a day (QID) | ORAL | Status: DC | PRN

## 2014-08-02 NOTE — Progress Notes (Signed)
Subjective:    Patient ID: [name], male    DOB: Mar 19, 1946, 69 y.o.   MRN: [id number]  HPI  Pt presents to the clinic today with c/o left sided flank pain. It started last night. He describes the pain and sharp and shooting. The pain does not radiate. The pain is intermittent.He denies urinary complaints, dysuria or blood in her urine. He denies diarrhea, nausea, vomiting or blood in his stools.He denies chest pain, chest tightness or shortness of breath. He has tried Tylenol without relief. He has a history of IBS but is not sure if this is related. He denies any injury to the area.   Review of Systems      Past Medical History  Diagnosis Date  . Chicken pox   . Migraine   . HTN (hypertension)     a. 11/2010 Echo: EF 55-65%.  . ED (erectile dysfunction)   . Obstructive sleep apnea   . CKD (chronic kidney disease), stage III     Current Outpatient Prescriptions  Medication Sig Dispense Refill  . acetaminophen (TYLENOL) 500 MG tablet Take 500 mg by mouth every 6 (six) hours as needed for moderate pain.    Marland Kitchen amLODipine (NORVASC) 5 MG tablet TAKE 1 TABLET (5 MG TOTAL) BY MOUTH DAILY. 30 tablet 5  . aspirin 81 MG tablet Take 81 mg by mouth daily.    . cholecalciferol (VITAMIN D) 1000 UNITS tablet Take 1,000 Units by mouth daily.    . SUMAtriptan (IMITREX) 100 MG tablet TAKE 1/2 TO 1 TABLET BY MOUTH FOR MIGRAINE.MAY REPEAT 2 HOURS LATER. NO MORE THAN 200MG /DAY 9 tablet 0  . vardenafil (LEVITRA) 20 MG tablet Take 1 tablet (20 mg total) by mouth daily as needed for erectile dysfunction. 1/2 - 1 tablet 1 hour PRN 10 tablet 0   No current facility-administered medications for this visit.    No Known Allergies  Family History  Problem Relation Age of Onset  . Arthritis Mother   . Cancer Mother   . Hypertension Mother   . Colon cancer Brother     History   Social History  . Marital Status: Married    Spouse Name: N/A  . Number of Children: N/A  . Years of  Education: N/A   Occupational History  . retired     Adult nurse   Social History Main Topics  . Smoking status: Former Smoker -- 0.50 packs/day for 10 years    Types: Cigarettes    Quit date: 07/12/1976  . Smokeless tobacco: Never Used     Comment: Quit 1978  . Alcohol Use: No  . Drug Use: No  . Sexual Activity: Yes   Other Topics Concern  . Not on file   Social History Narrative     Constitutional: Pt reports fever. Denies malaise, fatigue, headache or abrupt weight changes.  Respiratory: Denies difficulty breathing, shortness of breath, cough or sputum production.   Cardiovascular: Denies chest pain, chest tightness, palpitations or swelling in the hands or feet.  Gastrointestinal: Pt reports left side abdominal pain. Denies bloating, constipation, diarrhea or blood in the stool.  GU: Denies urgency, frequency, pain with urination, burning sensation, blood in urine, odor or discharge. Musculoskeletal: Denies decrease in range of motion, difficulty with gait, muscle pain or joint pain and swelling.  Skin: Denies redness, rashes, lesions or ulcercations.     No other specific complaints in a complete review of systems (except as listed in HPI above).  Objective:  Physical Exam  BP 136/80 mmHg  Pulse 82  Temp(Src) 100 F (37.8 C) (Oral)  Wt 206 lb (93.441 kg)  SpO2 98% Wt Readings from Last 3 Encounters:  08/02/14 206 lb (93.441 kg)  04/26/14 201 lb (91.173 kg)  03/13/14 204 lb (92.534 kg)    General: Appears his stated age, well developed, well nourished in NAD. Skin: Warm, dry and intact.  Cardiovascular: Normal rate and rhythm. S1,S2 noted.  No murmur, rubs or gallops noted.  Pulmonary/Chest: Normal effort and positive vesicular breath sounds. No respiratory distress. No wheezes, rales or ronchi noted.  Abdomen: Soft and tender at a pinpoint location in the left lateral abdomen/chest. Normal bowel sounds, no bruits noted. No distention or masses noted. Liver,  spleen and kidneys non palpable. No CVA tenderness. Neurological: Alert and oriented.   BMET    Component Value Date/Time   NA 139 04/26/2014 0854   K 4.1 04/26/2014 0854   CL 106 04/26/2014 0854   CO2 28 04/26/2014 0854   GLUCOSE 92 04/26/2014 0854   BUN 16 04/26/2014 0854   CREATININE 1.39 04/26/2014 0854   CALCIUM 9.2 04/26/2014 0854   GFRNONAA 47* 11/17/2013 1607   GFRAA 54* 11/17/2013 1607    Lipid Panel     Component Value Date/Time   CHOL 160 04/26/2014 0854   TRIG 72.0 04/26/2014 0854   HDL 44.90 04/26/2014 0854   CHOLHDL 4 04/26/2014 0854   VLDL 14.4 04/26/2014 0854   LDLCALC 101* 04/26/2014 0854    CBC    Component Value Date/Time   WBC 4.3 04/26/2014 0854   RBC 4.49 04/26/2014 0854   HGB 13.5 04/26/2014 0854   HCT 40.3 04/26/2014 0854   PLT 209.0 04/26/2014 0854   MCV 89.7 04/26/2014 0854   MCH 30.0 11/17/2013 1715   MCHC 33.6 04/26/2014 0854   RDW 14.8 04/26/2014 0854   LYMPHSABS 1.8 11/17/2013 1715   MONOABS 0.5 11/17/2013 1715   EOSABS 0.1 11/17/2013 1715   BASOSABS 0.0 11/17/2013 1715    Hgb A1C No results found for: HGBA1C       Assessment & Plan:   Left side abdominal pain:  This seems suspicious for kidney stones Urinalysis: normal Will check CBC, CMET and Lipase today If labs normal and pain severe consider CT scan of abdomen  Will follow up after labs are back

## 2014-08-02 NOTE — Patient Instructions (Signed)

## 2014-08-02 NOTE — Progress Notes (Signed)
Pre visit review using our clinic review tool, if applicable. No additional management support is needed unless otherwise documented below in the visit note. 

## 2014-08-03 LAB — COMPREHENSIVE METABOLIC PANEL
ALBUMIN: 4.4 g/dL (ref 3.5–5.2)
ALT: 15 U/L (ref 0–53)
AST: 22 U/L (ref 0–37)
Alkaline Phosphatase: 49 U/L (ref 39–117)
BUN: 12 mg/dL (ref 6–23)
CALCIUM: 9.5 mg/dL (ref 8.4–10.5)
CO2: 29 meq/L (ref 19–32)
Chloride: 102 mEq/L (ref 96–112)
Creatinine, Ser: 1.63 mg/dL — ABNORMAL HIGH (ref 0.40–1.50)
GFR: 54.41 mL/min — ABNORMAL LOW (ref >60.00)
GLUCOSE: 94 mg/dL (ref 70–99)
Potassium: 4.3 mEq/L (ref 3.5–5.1)
SODIUM: 136 meq/L (ref 135–145)
Total Bilirubin: 1.6 mg/dL — ABNORMAL HIGH (ref 0.2–1.2)
Total Protein: 7.6 g/dL (ref 6.0–8.3)

## 2014-08-03 LAB — CBC
HCT: 38.9 % — ABNORMAL LOW (ref 39.0–52.0)
HEMOGLOBIN: 13.4 g/dL (ref 13.0–17.0)
MCHC: 34.5 g/dL (ref 30.0–36.0)
MCV: 87.7 fl (ref 78.0–100.0)
Platelets: 197 10*3/uL (ref 150.0–400.0)
RBC: 4.44 Mil/uL (ref 4.22–5.81)
RDW: 14.7 % (ref 11.5–15.5)
WBC: 9 10*3/uL (ref 4.0–10.5)

## 2014-08-03 LAB — LIPASE: Lipase: 18 U/L (ref 11.0–59.0)

## 2014-08-07 ENCOUNTER — Ambulatory Visit (INDEPENDENT_AMBULATORY_CARE_PROVIDER_SITE_OTHER): Payer: Commercial Managed Care - HMO | Admitting: Pulmonary Disease

## 2014-08-07 ENCOUNTER — Encounter (INDEPENDENT_AMBULATORY_CARE_PROVIDER_SITE_OTHER): Payer: Self-pay

## 2014-08-07 ENCOUNTER — Encounter: Payer: Self-pay | Admitting: Pulmonary Disease

## 2014-08-07 VITALS — BP 140/96 | HR 72 | Temp 97.1°F | Ht 71.5 in | Wt 204.0 lb

## 2014-08-07 DIAGNOSIS — G4733 Obstructive sleep apnea (adult) (pediatric): Secondary | ICD-10-CM | POA: Diagnosis not present

## 2014-08-07 NOTE — Patient Instructions (Addendum)
Follow up in 1 year Can check following company web sites for information about CPAP machines: [employer]

## 2014-08-07 NOTE — Progress Notes (Signed)
Chief Complaint  Patient presents with  . Follow-up    OSA; doesn't use CPAP; sleeps 6-7 hrs nightly; wants to discuss getting a CPAP    History of Present Illness: [name] is a 69 y.o. male with OSA.  He was fitted for oral appliance last summer by Dr. Ron Parker.  This seemed to help initially, but then his wife heard him snoring more over the past two months.  He tried adjusting his mouth piece, but then his jaw starting hurting.  He has not been back to see Dr. Ron Parker since getting oral appliance set up.  He has not had follow up sleep study done.  He feels rested during the day after using oral appliance.  TESTS: PSG 09/21/13 >> AHI 8.9, SaO2 low 88%. REM effect.   PMHx >> Headaches, HTN, CKD stage III  PSHx, Medications, Allergies, Fhx, Shx reviewed.   Physical Exam: Blood pressure 140/96, pulse 72, temperature 97.1 F (36.2 C), temperature source Oral, height 5' 11.5" (1.816 m), weight 204 lb (92.534 kg), SpO2 97 %. Body mass index is 28.06 kg/(m^2).  General - No distress ENT - No sinus tenderness, no oral exudate, no LAN, MP 3, enlarged tongue, high arched palate, decreased AP diameter Cardiac - s1s2 regular, no murmur Chest - No wheeze/rales/dullness Back - No focal tenderness Abd - Soft, non-tender Ext - No edema Neuro - Normal strength Skin - No rashes Psych - normal mood, and behavior   Assessment/Plan:  Obstructive sleep apnea. Discussion: He is having trouble adjusting to oral appliance. Plan: - advised him to f/u with Dr. Ron Parker to assess fit of his oral appliance - advised he will need f/u sleep study with oral appliance once adjustments are finalized >> this can be done through Dr. Ron Parker or with [place] - if he is not able to do better with oral appliance, then he might need to switch to CPAP >> gave him list of company web sites to review, and advised him to call if he wants to switch to CPAP   Chesley Mires, MD [place] Pager:   [phone]

## 2014-08-13 ENCOUNTER — Encounter: Payer: Self-pay | Admitting: Internal Medicine

## 2014-08-13 ENCOUNTER — Ambulatory Visit (INDEPENDENT_AMBULATORY_CARE_PROVIDER_SITE_OTHER): Payer: Commercial Managed Care - HMO | Admitting: Internal Medicine

## 2014-08-13 VITALS — BP 138/84 | HR 81 | Temp 98.1°F | Wt 201.5 lb

## 2014-08-13 DIAGNOSIS — J309 Allergic rhinitis, unspecified: Secondary | ICD-10-CM

## 2014-08-13 NOTE — Progress Notes (Signed)
HPI  Pt presents to the clinic today with c/o headache, nasal congestion, ear fullness and sore throat. He reports this started 1 week ago. He is blowing blood tinged mucous out of his nose. His nose is very sore from blowing it all the time. He does feel like he can hear ringing in his ears. He denies ear pain or trauma to the head. He denies fever, chills or body aches. He was working outside cutting grass just prior to the onset of his symptoms. He has tried Thera Flu with some relief. He has no history of allergy or breathing problems. He has not had sick contacts.  Review of Systems    Past Medical History  Diagnosis Date  . Chicken pox   . Migraine   . HTN (hypertension)     a. 11/2010 Echo: EF 55-65%.  . ED (erectile dysfunction)   . Obstructive sleep apnea   . CKD (chronic kidney disease), stage III     Family History  Problem Relation Age of Onset  . Arthritis Mother   . Cancer Mother   . Hypertension Mother   . Colon cancer Brother     History   Social History  . Marital Status: Married    Spouse Name: N/A  . Number of Children: N/A  . Years of Education: N/A   Occupational History  . retired     Adult nurse   Social History Main Topics  . Smoking status: Former Smoker -- 0.50 packs/day for 10 years    Types: Cigarettes    Quit date: 07/12/1976  . Smokeless tobacco: Never Used     Comment: Quit 1978  . Alcohol Use: No  . Drug Use: No  . Sexual Activity: Yes   Other Topics Concern  . Not on file   Social History Narrative    No Known Allergies   Constitutional: Positive headache. Denies fatigue, fever or abrupt weight changes.  HEENT:  Positive ear fullness, nasal congestion and sore throat. Denies eye redness, ear pain, ringing in the ears, wax buildup, runny nose or bloody nose. Respiratory: Denies cough, difficulty breathing or shortness of breath.  Cardiovascular: Denies chest pain, chest tightness, palpitations or swelling in the hands or feet.    No other specific complaints in a complete review of systems (except as listed in HPI above).  Objective:  BP 138/84 mmHg  Pulse 81  Temp(Src) 98.1 F (36.7 C) (Oral)  Wt 201 lb 8 oz (91.4 kg)  SpO2 99%   General: Appears his stated age, well developed, well nourished in NAD. HEENT: Head: normal shape and size, no sinus tenderness noted; Eyes: sclera white, no icterus, conjunctiva pink; Ears: Tm's gray and intact, normal light reflex; Nose: mucosa boggy and moist, septum midline; Throat/Mouth: + PND. Teeth present, mucosa pink and moist, no exudate noted, no lesions or ulcerations noted.  Neck: No adenopathy noted. Cardiovascular: Normal rate and rhythm. S1,S2 noted.  No murmur, rubs or gallops noted.  Pulmonary/Chest: Normal effort and positive vesicular breath sounds. No respiratory distress. No wheezes, rales or ronchi noted.      Assessment & Plan:   Allergic Rhintis  Can use a Neti Pot which can be purchased from your local drug store. Flonase 2 sprays each nostril for 3 days and then as needed. Can use Zyrtec OTC  RTC as needed or if symptoms persist.

## 2014-08-13 NOTE — Patient Instructions (Signed)

## 2014-08-13 NOTE — Progress Notes (Signed)
Pre visit review using our clinic review tool, if applicable. No additional management support is needed unless otherwise documented below in the visit note. 

## 2014-10-11 ENCOUNTER — Encounter: Payer: Self-pay | Admitting: Internal Medicine

## 2014-10-12 ENCOUNTER — Ambulatory Visit (INDEPENDENT_AMBULATORY_CARE_PROVIDER_SITE_OTHER): Payer: Commercial Managed Care - HMO | Admitting: Internal Medicine

## 2014-10-12 ENCOUNTER — Encounter: Payer: Self-pay | Admitting: Internal Medicine

## 2014-10-12 VITALS — BP 144/92 | Temp 97.8°F | Ht 71.5 in | Wt 208.0 lb

## 2014-10-12 DIAGNOSIS — M25512 Pain in left shoulder: Secondary | ICD-10-CM | POA: Diagnosis not present

## 2014-10-12 DIAGNOSIS — N289 Disorder of kidney and ureter, unspecified: Secondary | ICD-10-CM | POA: Diagnosis not present

## 2014-10-12 MED ORDER — DICLOFENAC SODIUM 1 % TD GEL: 4.0000 g | Freq: Four times a day (QID) | TRANSDERMAL | Status: DC

## 2014-10-12 NOTE — Patient Instructions (Signed)
try  Topical antiinflammatory and pain med  And see . Orthopedics esp ... It is not your heart and lungs.   See  In the walk in clinic as discussed .   Shoulder Pain The shoulder is the joint that connects your arms to your body. The bones that form the shoulder joint include the upper arm bone (humerus), the shoulder blade (scapula), and the collarbone (clavicle). The top of the humerus is shaped like a ball and fits into a rather flat socket on the scapula (glenoid cavity). A combination of muscles and strong, fibrous tissues that connect muscles to bones (tendons) support your shoulder joint and hold the ball in the socket. Small, fluid-filled sacs (bursae) are located in different areas of the joint. They act as cushions between the bones and the overlying soft tissues and help reduce friction between the gliding tendons and the bone as you move your arm. Your shoulder joint allows a wide range of motion in your arm. This range of motion allows you to do things like scratch your back or throw a ball. However, this range of motion also makes your shoulder more prone to pain from overuse and injury. Causes of shoulder pain can originate from both injury and overuse and usually can be grouped in the following four categories:  Redness, swelling, and pain (inflammation) of the tendon (tendinitis) or the bursae (bursitis).  Instability, such as a dislocation of the joint.  Inflammation of the joint (arthritis).  Broken bone (fracture). HOME CARE INSTRUCTIONS   Apply ice to the sore area.  Put ice in a plastic bag.  Place a towel between your skin and the bag.  Leave the ice on for 15-20 minutes, 3-4 times per day for the first 2 days, or as directed by your health care provider.  Stop using cold packs if they do not help with the pain.  If you have a shoulder sling or immobilizer, wear it as long as your caregiver instructs. Only remove it to shower or bathe. Move your arm as little as  possible, but keep your hand moving to prevent swelling.  Squeeze a soft ball or foam pad as much as possible to help prevent swelling.  Only take over-the-counter or prescription medicines for pain, discomfort, or fever as directed by your caregiver. SEEK MEDICAL CARE IF:   Your shoulder pain increases, or new pain develops in your arm, hand, or fingers.  Your hand or fingers become cold and numb.  Your pain is not relieved with medicines. SEEK IMMEDIATE MEDICAL CARE IF:   Your arm, hand, or fingers are numb or tingling.  Your arm, hand, or fingers are significantly swollen or turn white or blue. MAKE SURE YOU:   Understand these instructions.  Will watch your condition.  Will get help right away if you are not doing well or get worse. Document Released: 01/07/2005 Document Revised: 08/14/2013 Document Reviewed: 03/14/2011 [facility] Patient Information 2015 [place], [place]. This information is not intended to replace advice given to you by your health care provider. Make sure you discuss any questions you have with your health care provider.

## 2014-10-12 NOTE — Progress Notes (Signed)
Pre visit review using our clinic review tool, if applicable. No additional management support is needed unless otherwise documented below in the visit note.   Chief Complaint  Patient presents with  . Left Shoulder Pain    X8Days.  Unable to rest at night.  Should not be taking Advil due to his kidney.  Tylenol helps but does not last.  Would like to know what else he can take.    HPI: [name] 69 y.o.  Patient comes in for SDA acute PCP not available at [facility]. He has had 8 days or so of left shoulder pain and feels acute and badly in the upper arm. No specific injury But remembers  Lifting  About 2 weight  20 # on  vacation  And then days later May of onset does have history of pain that lasted short lived possibly from [place] in the remote past. He is right-handed.      Continuous pain  . Then acute Worse positional .  No fever no other chest pain shortness of breath.   No hx of same pain that lasted more than a day or so .  Poss from [place] .  Right handed.  ROS: See pertinent positives and negatives per HPI. No numbness or weakness. He has some renal insufficiency and is avoiding anti-inflammatory but has taken Advil. Not that helpful. Also has taken Tylenol. Has leftover hydrocodone from a episode of diverticulitis but hasn't taken it.  Past history; had shoulder dislocation with a temporary pinning when he was 69 years old and the same shoulder from a football injury. He also may have had rotator cuff tendinitis a number of years ago which seemed to respond to a cortisone injection. At some point he was told he needed shoulder surgery but avoided it when the injection helped.   Past Medical History  Diagnosis Date  . Chicken pox   . Migraine   . HTN (hypertension)     a. 11/2010 Echo: EF 55-65%.  . ED (erectile dysfunction)   . Obstructive sleep apnea   . CKD (chronic kidney disease), stage III     Family History  Problem Relation Age of  Onset  . Arthritis Mother   . Cancer Mother   . Hypertension Mother   . Colon cancer Brother     History   Social History  . Marital Status: Married    Spouse Name: N/A  . Number of Children: N/A  . Years of Education: N/A   Occupational History  . retired     Adult nurse   Social History Main Topics  . Smoking status: Former Smoker -- 0.50 packs/day for 10 years    Types: Cigarettes    Quit date: 07/12/1976  . Smokeless tobacco: Never Used     Comment: Quit 1978  . Alcohol Use: No  . Drug Use: No  . Sexual Activity: Yes   Other Topics Concern  . None   Social History Narrative    Outpatient Prescriptions Prior to Visit  Medication Sig Dispense Refill  . acetaminophen (TYLENOL) 500 MG tablet Take 500 mg by mouth every 6 (six) hours as needed for moderate pain.    Marland Kitchen amLODipine (NORVASC) 5 MG tablet TAKE 1 TABLET (5 MG TOTAL) BY MOUTH DAILY. 30 tablet 5  . aspirin 81 MG tablet Take 81 mg by mouth daily.    . cholecalciferol (VITAMIN D) 1000 UNITS tablet Take 1,000 Units by mouth daily.    Marland Kitchen  vardenafil (LEVITRA) 20 MG tablet Take 1 tablet (20 mg total) by mouth daily as needed for erectile dysfunction. 1/2 - 1 tablet 1 hour PRN 10 tablet 0  . HYDROcodone-acetaminophen (NORCO/VICODIN) 5-325 MG per tablet Take 1 tablet by mouth every 6 (six) hours as needed for moderate pain. (Patient not taking: Reported on 10/12/2014) 30 tablet 0  . SUMAtriptan (IMITREX) 100 MG tablet TAKE 1/2 TO 1 TABLET BY MOUTH FOR MIGRAINE.MAY REPEAT 2 HOURS LATER. NO MORE THAN 200MG /DAY (Patient not taking: Reported on 10/12/2014) 9 tablet 0   No facility-administered medications prior to visit.     EXAM:  BP 144/92 mmHg  Temp(Src) 97.8 F (36.6 C) (Oral)  Ht 5' 11.5" (1.816 m)  Wt 208 lb (94.348 kg)  BMI 28.61 kg/m2  Body mass index is 28.61 kg/(m^2).  GENERAL: vitals reviewed and listed above, alert, oriented, appears well hydrated and in no acute distress HEENT: atraumatic, conjunctiva  clear,  no obvious abnormalities on inspection of external nose and ears NECK: no obvious masses on inspection palpation  LUNGS: clear to auscultation bilaterally, no wheezes, rales or rhonchi, good air movement CV: HRRR, no clubbing cyanosis or  peripheral edema nl cap refill  MS: moves all extremities  clavicles symmetrical no point tenderness but very tender over the left deltoid bursa and posterior shoulder. Range of motion pain on cross body can internally and externally rotate otherwise and lift above 180. No obvious weakness on left shoulder. PSYCH: pleasant and cooperative, no obvious depression or anxiety  ASSESSMENT AND PLAN:  Discussed the following assessment and plan:  Acute shoulder pain, left - Suspect bursitis tendinitis in a remotely injured shoulder see text. No alarm findings infection etc. do not think it's cardiovascular. I think he should be in   Renal insufficiency May respond to cortisone injection versus other inflammatory's can try topical pain pill information given for the acute orthopedic clinic at [facility] that should be open this evening and tomorrow he can call ahead. Would advise x ray and ortho intervention.  And FU. As the Friday of a holiday weekend. -Patient advised to return or notify health care team  if symptoms worsen ,persist or new concerns arise.  Patient Instructions   try  Topical antiinflammatory and pain med  And see . Orthopedics esp ... It is not your heart and lungs.   See  In the walk in clinic as discussed .   Shoulder Pain The shoulder is the joint that connects your arms to your body. The bones that form the shoulder joint include the upper arm bone (humerus), the shoulder blade (scapula), and the collarbone (clavicle). The top of the humerus is shaped like a ball and fits into a rather flat socket on the scapula (glenoid cavity). A combination of muscles and strong, fibrous tissues that connect muscles to bones (tendons) support your  shoulder joint and hold the ball in the socket. Small, fluid-filled sacs (bursae) are located in different areas of the joint. They act as cushions between the bones and the overlying soft tissues and help reduce friction between the gliding tendons and the bone as you move your arm. Your shoulder joint allows a wide range of motion in your arm. This range of motion allows you to do things like scratch your back or throw a ball. However, this range of motion also makes your shoulder more prone to pain from overuse and injury. Causes of shoulder pain can originate from both injury and overuse and usually can  be grouped in the following four categories:  Redness, swelling, and pain (inflammation) of the tendon (tendinitis) or the bursae (bursitis).  Instability, such as a dislocation of the joint.  Inflammation of the joint (arthritis).  Broken bone (fracture). HOME CARE INSTRUCTIONS   Apply ice to the sore area.  Put ice in a plastic bag.  Place a towel between your skin and the bag.  Leave the ice on for 15-20 minutes, 3-4 times per day for the first 2 days, or as directed by your health care provider.  Stop using cold packs if they do not help with the pain.  If you have a shoulder sling or immobilizer, wear it as long as your caregiver instructs. Only remove it to shower or bathe. Move your arm as little as possible, but keep your hand moving to prevent swelling.  Squeeze a soft ball or foam pad as much as possible to help prevent swelling.  Only take over-the-counter or prescription medicines for pain, discomfort, or fever as directed by your caregiver. SEEK MEDICAL CARE IF:   Your shoulder pain increases, or new pain develops in your arm, hand, or fingers.  Your hand or fingers become cold and numb.  Your pain is not relieved with medicines. SEEK IMMEDIATE MEDICAL CARE IF:   Your arm, hand, or fingers are numb or tingling.  Your arm, hand, or fingers are significantly  swollen or turn white or blue. MAKE SURE YOU:   Understand these instructions.  Will watch your condition.  Will get help right away if you are not doing well or get worse. Document Released: 01/07/2005 Document Revised: 08/14/2013 Document Reviewed: 03/14/2011 [facility] Patient Information 2015 [place], [place]. This information is not intended to replace advice given to you by your health care provider. Make sure you discuss any questions you have with your health care provider.        Standley Brooking. Panosh M.D.

## 2014-10-18 ENCOUNTER — Encounter: Payer: Self-pay | Admitting: Family Medicine

## 2014-10-18 ENCOUNTER — Ambulatory Visit (INDEPENDENT_AMBULATORY_CARE_PROVIDER_SITE_OTHER): Payer: Commercial Managed Care - HMO | Admitting: Family Medicine

## 2014-10-18 VITALS — BP 138/84 | HR 65 | Temp 98.2°F | Ht 71.0 in | Wt 206.8 lb

## 2014-10-18 DIAGNOSIS — M25512 Pain in left shoulder: Secondary | ICD-10-CM

## 2014-10-18 DIAGNOSIS — M7552 Bursitis of left shoulder: Secondary | ICD-10-CM

## 2014-10-18 MED ORDER — METHYLPREDNISOLONE ACETATE 40 MG/ML IJ SUSP
80.0000 mg | Freq: Once | INTRAMUSCULAR | Status: AC
  Administered 2014-10-18: 80 mg via INTRA_ARTICULAR

## 2014-10-18 NOTE — Progress Notes (Signed)
Dr. Frederico Hamman T. Aiyanna Awtrey, MD, [facility] [place], [address] Phone: [phone] Fax: [phone]  10/18/2014  Patient: [name], MRN: [id number], DOB: June 11, 1945, 69 y.o.  Primary Physician:  Webb Silversmith, NP  Chief Complaint: Shoulder Pain  Subjective:   [name] is a 69 y.o. very pleasant male patient who presents with the following:   L shoulder pain:  Old injury on the L side and thought he had a ? Torn [place]uff in [place]. I pulled up the patient's MRI report from 2006, and he had a full-thickness distal supraspinatus tear.  He elected to not have operative intervention on this and treat this conservatively, and he has done remarkably well with this.  He does not really have any strength deficits after this initial injury.  Pain is all over the place and pain is pretty significant on the posterior. Some pain and difficulty sleeping.  Heat helps a little bit. Has a hard time sitting. He is having pain in a T-shirt distribution  [place] about three weeks ago, grabbed an bag from the counter. Grabbed with her left arm. He was trying to assist a lady while at the airport, and then felt some pain in her shoulder.  Past Medical History, Surgical History, Social History, Family History, Problem List, Medications, and Allergies have been reviewed and updated if relevant.  Patient Active Problem List   Diagnosis Date Noted  . OSA (obstructive sleep apnea) 09/16/2013  . Renal insufficiency 07/04/2013  . Benign essential HTN 06/15/2013  . ED (erectile dysfunction) 04/28/2013  . Migraines 04/28/2013    Past Medical History  Diagnosis Date  . Chicken pox   . Migraine   . HTN (hypertension)     a. 11/2010 Echo: EF 55-65%.  . ED (erectile dysfunction)   . Obstructive sleep apnea   . CKD (chronic kidney disease), stage III     Past Surgical History  Procedure Laterality Date  . Knee  surgery Left     1970  . Shoulder surgery Left     hx dislocatino with temp  pin age 69 years  from [place]    History   Social History  . Marital Status: Married    Spouse Name: N/A  . Number of Children: N/A  . Years of Education: N/A   Occupational History  . retired     Adult nurse   Social History Main Topics  . Smoking status: Former Smoker -- 0.50 packs/day for 10 years    Types: Cigarettes    Quit date: 07/12/1976  . Smokeless tobacco: Never Used     Comment: Quit 1978  . Alcohol Use: No  . Drug Use: No  . Sexual Activity: Yes   Other Topics Concern  . Not on file   Social History Narrative    Family History  Problem Relation Age of Onset  . Arthritis Mother   . Cancer Mother   . Hypertension Mother   . Colon cancer Brother     No Known Allergies  Medication list reviewed and updated in full in [place].  GEN: No fevers, chills. Nontoxic. Primarily MSK c/o today. MSK: Detailed in the HPI GI: tolerating PO intake without difficulty Neuro: No numbness, parasthesias, or tingling associated. Otherwise the pertinent positives of the ROS are noted above.   Objective:   BP 138/84 mmHg  Pulse 65  Temp(Src) 98.2 F (36.8 C) (Oral)  Ht  5\' 11"  (1.803 m)  Wt 206 lb 12.8 oz (93.804 kg)  BMI 28.86 kg/m2  SpO2 98%   GEN: Well-developed,well-nourished,in no acute distress; alert,appropriate and cooperative throughout examination HEENT: Normocephalic and atraumatic without obvious abnormalities. Ears, externally no deformities PULM: Breathing comfortably in no respiratory distress EXT: No clubbing, cyanosis, or edema PSYCH: Normally interactive. Cooperative during the interview. Pleasant. Friendly and conversant. Not anxious or depressed appearing. Normal, full affect.  Shoulder: LEFT Inspection: No muscle wasting or winging Ecchymosis/edema: neg  AC joint, scapula, clavicle: NT Cervical spine: NT, full ROM Spurling's: neg Abduction: full, 5/5,  painful arc Flexion: full, 5/5 IR, full, lift-off: 5/5 ER at neutral: full, 5/5 AC crossover: neg Neer: pos Hawkins: pos Drop Test: neg Empty Can: neg Supraspinatus insertion: mild-mod T Bicipital groove: NT Speed's: neg Yergason's: neg Sulcus sign: neg Scapular dyskinesis: none C5-T1 intact  Neuro: Sensation intact Grip 5/5   Radiology: Prior MRI and XR reports reviewed  Assessment and Plan:   Subacromial bursitis, left  Left shoulder pain - Plan: methylPREDNISolone acetate (DEPO-MEDROL) injection 80 mg   Shoulder is remarkably strong with no strength deficit in someone who has a known prior full-thickness rotator cuff tear 10 years ago.  Likely when lifting a suitcase either had traumatic rotator cuff contusion, acute bursitis, or it is possible he injured part of his rotator cuff again.  He understands that since he tore his rotator cuff 10 years ago, he is well beyond the point of consideration of operative management.  In this case, I think that a subacromial injection for pain relief will help with rehabilitation.  He has done rehabilitation for his shoulder in the past.  Reviewed again.  SubAC Injection, LEFT Verbal consent was obtained from the patient. Risks (including rare infection), benefits, and alternatives were explained. Patient prepped with Chloraprep and Ethyl Chloride used for anesthesia. The subacromial space was injected using the posterior approach. The patient tolerated the procedure well and had decreased pain post injection. No complications. Injection: 8 cc of Lidocaine 1% and Depo-Medrol 80 mg. Needle: 22 gauge   Follow-up: if not making good progress, or if needed we can consult PT to assist with rehab  Signed,  Frederico Hamman T. Lanesha Azzaro, MD   Patient's Medications  New Prescriptions   No medications on file  Previous Medications   ACETAMINOPHEN (TYLENOL) 500 MG TABLET    Take 500 mg by mouth every 6 (six) hours as needed for moderate pain.    AMLODIPINE (NORVASC) 5 MG TABLET    TAKE 1 TABLET (5 MG TOTAL) BY MOUTH DAILY.   ASPIRIN 81 MG TABLET    Take 81 mg by mouth daily.   CHOLECALCIFEROL (VITAMIN D) 1000 UNITS TABLET    Take 1,000 Units by mouth daily.   DICLOFENAC SODIUM (VOLTAREN) 1 % GEL    Apply 4 g topically 4 (four) times daily. To affected joint.   HYDROCODONE-ACETAMINOPHEN (NORCO/VICODIN) 5-325 MG PER TABLET    Take 1 tablet by mouth every 6 (six) hours as needed for moderate pain.   SUMATRIPTAN (IMITREX) 100 MG TABLET    TAKE 1/2 TO 1 TABLET BY MOUTH FOR MIGRAINE.MAY REPEAT 2 HOURS LATER. NO MORE THAN 200MG /DAY   VARDENAFIL (LEVITRA) 20 MG TABLET    Take 1 tablet (20 mg total) by mouth daily as needed for erectile dysfunction. 1/2 - 1 tablet 1 hour PRN  Modified Medications   No medications on file  Discontinued Medications   No medications on file

## 2014-10-18 NOTE — Progress Notes (Signed)
Pre visit review using our clinic review tool, if applicable. No additional management support is needed unless otherwise documented below in the visit note. 

## 2014-10-21 ENCOUNTER — Encounter: Payer: Self-pay | Admitting: Family Medicine

## 2014-10-23 ENCOUNTER — Telehealth: Payer: Self-pay

## 2014-10-23 ENCOUNTER — Encounter: Payer: Self-pay | Admitting: Family Medicine

## 2014-10-23 NOTE — Telephone Encounter (Signed)
Pt has activation code but having difficulty getting into mychart; pt will call assistance with mychart at [phone].

## 2014-10-24 ENCOUNTER — Other Ambulatory Visit: Payer: Self-pay | Admitting: Family Medicine

## 2014-10-24 DIAGNOSIS — M25512 Pain in left shoulder: Secondary | ICD-10-CM

## 2014-10-24 DIAGNOSIS — M7552 Bursitis of left shoulder: Secondary | ICD-10-CM

## 2014-10-24 MED ORDER — TRAMADOL HCL 50 MG PO TABS: 50.0000 mg | ORAL_TABLET | Freq: Four times a day (QID) | ORAL | Status: DC | PRN

## 2014-10-24 NOTE — Telephone Encounter (Signed)
Tramadol call in to [facility].

## 2014-10-24 NOTE — Telephone Encounter (Signed)
Tramadol 50 mg, 1 po q 6 hours prn pain, #50, 2 refills   Please call in.  Let patient know - no problem with kidneys at all. Can take along with tylenol  Ok to start rehab. Most people not completely pain free when starting and PT modalities should help with pain, too.  Our front office will call to schedule for you.  Electronically Signed  By: Owens Loffler, MD On: 10/24/2014 2:12 PM

## 2014-10-29 ENCOUNTER — Ambulatory Visit (INDEPENDENT_AMBULATORY_CARE_PROVIDER_SITE_OTHER): Payer: Commercial Managed Care - HMO | Admitting: Internal Medicine

## 2014-10-29 ENCOUNTER — Ambulatory Visit (INDEPENDENT_AMBULATORY_CARE_PROVIDER_SITE_OTHER)
Admission: RE | Admit: 2014-10-29 | Discharge: 2014-10-29 | Disposition: A | Discharge status: Home / Self Care | Payer: Commercial Managed Care - HMO | Source: Ambulatory Visit | Attending: Internal Medicine | Admitting: Internal Medicine

## 2014-10-29 ENCOUNTER — Encounter: Payer: Self-pay | Admitting: Internal Medicine

## 2014-10-29 VITALS — BP 132/84 | HR 72 | Temp 98.3°F | Wt 202.0 lb

## 2014-10-29 DIAGNOSIS — G4733 Obstructive sleep apnea (adult) (pediatric): Secondary | ICD-10-CM

## 2014-10-29 DIAGNOSIS — G43809 Other migraine, not intractable, without status migrainosus: Secondary | ICD-10-CM

## 2014-10-29 DIAGNOSIS — I10 Essential (primary) hypertension: Secondary | ICD-10-CM | POA: Diagnosis not present

## 2014-10-29 DIAGNOSIS — E042 Nontoxic multinodular goiter: Secondary | ICD-10-CM | POA: Diagnosis not present

## 2014-10-29 DIAGNOSIS — N289 Disorder of kidney and ureter, unspecified: Secondary | ICD-10-CM

## 2014-10-29 DIAGNOSIS — M25512 Pain in left shoulder: Secondary | ICD-10-CM

## 2014-10-29 DIAGNOSIS — N529 Male erectile dysfunction, unspecified: Secondary | ICD-10-CM

## 2014-10-29 DIAGNOSIS — I499 Cardiac arrhythmia, unspecified: Secondary | ICD-10-CM | POA: Diagnosis not present

## 2014-10-29 LAB — CBC WITH DIFFERENTIAL/PLATELET
Basophils Absolute: 0 10*3/uL (ref 0.0–0.1)
Basophils Relative: 0.5 % (ref 0.0–3.0)
Eosinophils Absolute: 0.1 10*3/uL (ref 0.0–0.7)
Eosinophils Relative: 1.4 % (ref 0.0–5.0)
HEMATOCRIT: 38.9 % — AB (ref 39.0–52.0)
Hemoglobin: 13.3 g/dL (ref 13.0–17.0)
Lymphocytes Relative: 46.3 % — ABNORMAL HIGH (ref 12.0–46.0)
Lymphs Abs: 1.8 10*3/uL (ref 0.7–4.0)
MCHC: 34.3 g/dL (ref 30.0–36.0)
MCV: 88.5 fl (ref 78.0–100.0)
MONOS PCT: 9.1 % (ref 3.0–12.0)
Monocytes Absolute: 0.4 10*3/uL (ref 0.1–1.0)
NEUTROS ABS: 1.7 10*3/uL (ref 1.4–7.7)
Neutrophils Relative %: 42.7 % — ABNORMAL LOW (ref 43.0–77.0)
PLATELETS: 210 10*3/uL (ref 150.0–400.0)
RBC: 4.39 Mil/uL (ref 4.22–5.81)
RDW: 15.3 % (ref 11.5–15.5)
WBC: 4 10*3/uL (ref 4.0–10.5)

## 2014-10-29 LAB — COMPREHENSIVE METABOLIC PANEL
ALK PHOS: 45 U/L (ref 39–117)
ALT: 17 U/L (ref 0–53)
AST: 18 U/L (ref 0–37)
Albumin: 4.1 g/dL (ref 3.5–5.2)
BILIRUBIN TOTAL: 1 mg/dL (ref 0.2–1.2)
BUN: 15 mg/dL (ref 6–23)
CHLORIDE: 105 meq/L (ref 96–112)
CO2: 28 meq/L (ref 19–32)
Calcium: 9.3 mg/dL (ref 8.4–10.5)
Creatinine, Ser: 1.34 mg/dL (ref 0.40–1.50)
GFR: 68.16 mL/min (ref >60.00)
GLUCOSE: 90 mg/dL (ref 70–99)
Potassium: 4.1 mEq/L (ref 3.5–5.1)
Sodium: 137 mEq/L (ref 135–145)
TOTAL PROTEIN: 7.2 g/dL (ref 6.0–8.3)

## 2014-10-29 LAB — LIPID PANEL
CHOLESTEROL: 160 mg/dL (ref 0–200)
HDL: 51.1 mg/dL (ref >39.00)
LDL CALC: 96 mg/dL (ref 0–99)
NONHDL: 108.9
Total CHOL/HDL Ratio: 3
Triglycerides: 63 mg/dL (ref 0.0–149.0)
VLDL: 12.6 mg/dL (ref 0.0–40.0)

## 2014-10-29 LAB — TSH: TSH: 1.15 u[IU]/mL (ref 0.35–4.50)

## 2014-10-29 NOTE — Addendum Note (Signed)
Addended by: Marchia Bond on: 10/29/2014 04:03 PM   Modules accepted: Miquel Dunn

## 2014-10-29 NOTE — Assessment & Plan Note (Signed)
Will check CMET today

## 2014-10-29 NOTE — Progress Notes (Signed)
Pre visit review using our clinic review tool, if applicable. No additional management support is needed unless otherwise documented below in the visit note. 

## 2014-10-29 NOTE — Progress Notes (Signed)
Subjective:    Patient ID: [name], male    DOB: 07/26/1945, 69 y.o.   MRN: [id number]  HPI  Pt presents to the clinic today for 6 month follow up of chronic conditions.  HTN: BP well controlled on Norvasc. His BP today is 132/84. He denies chest pain, chest tightness or shortness of breath.  Thyroid Nodules: His TSH has been normal. His last thyroid ultrasound was 07/21/2012. He denies fatigue, constipation, weight gain, cold sensation or depression.  Migraines: He has not had migraines in quit a wile. He uses Imitrex as abortive therapy.  OSA: He uses a dental device, not CPAP. He does follow with Dr. Halford Chessman, last note reviewed.  CRI: Last creatinine is 1.63.  ED: He uses Levitra prn.  He reports left shoulder pain. This has been going on since June. He thinks he injured it in June, while lifting suitcases. He reports he had a partially torn rotator cuff 15 years ago. He did see Dr. Lorelei Pont for the same. He received an injection for subacromial bursa but reports it did not help. Dr. Lorelei Pont did refer him to PT, which he will start in a few weeks. He is concerned about going though because he is in so much pain. He has been taking Tramadol with some relief, but he reports the Tramadol makes him dizzy.  Review of Systems      Past Medical History  Diagnosis Date  . Chicken pox   . Migraine   . HTN (hypertension)     a. 11/2010 Echo: EF 55-65%.  . ED (erectile dysfunction)   . Obstructive sleep apnea   . CKD (chronic kidney disease), stage III     Current Outpatient Prescriptions  Medication Sig Dispense Refill  . acetaminophen (TYLENOL) 500 MG tablet Take 500 mg by mouth every 6 (six) hours as needed for moderate pain.    Marland Kitchen amLODipine (NORVASC) 5 MG tablet TAKE 1 TABLET (5 MG TOTAL) BY MOUTH DAILY. 30 tablet 5  . aspirin 81 MG tablet Take 81 mg by mouth daily.    . cholecalciferol (VITAMIN D) 1000 UNITS tablet Take 1,000 Units by mouth daily.    Marland Kitchen  HYDROcodone-acetaminophen (NORCO/VICODIN) 5-325 MG per tablet Take 1 tablet by mouth every 6 (six) hours as needed for moderate pain. 30 tablet 0  . SUMAtriptan (IMITREX) 100 MG tablet TAKE 1/2 TO 1 TABLET BY MOUTH FOR MIGRAINE.MAY REPEAT 2 HOURS LATER. NO MORE THAN 200MG /DAY 9 tablet 0  . traMADol (ULTRAM) 50 MG tablet Take 1 tablet (50 mg total) by mouth every 6 (six) hours as needed. 50 tablet 2  . vardenafil (LEVITRA) 20 MG tablet Take 1 tablet (20 mg total) by mouth daily as needed for erectile dysfunction. 1/2 - 1 tablet 1 hour PRN 10 tablet 0   No current facility-administered medications for this visit.    No Known Allergies  Family History  Problem Relation Age of Onset  . Arthritis Mother   . Cancer Mother   . Hypertension Mother   . Colon cancer Brother     History   Social History  . Marital Status: Married    Spouse Name: N/A  . Number of Children: N/A  . Years of Education: N/A   Occupational History  . retired     Adult nurse   Social History Main Topics  . Smoking status: Former Smoker -- 0.50 packs/day for 10 years    Types: Cigarettes    Quit date: 07/12/1976  .  Smokeless tobacco: Never Used     Comment: Quit 1978  . Alcohol Use: No  . Drug Use: No  . Sexual Activity: Yes   Other Topics Concern  . 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  HEENT: Denies eye pain, eye redness, ear pain, ringing in the ears, wax buildup, runny nose, nasal congestion, bloody nose, or sore throat. Respiratory: Denies difficulty breathing, shortness of breath, cough or sputum production.   Cardiovascular: Pt reports palpitations. Denies chest pain, chest tightness,  or swelling in the hands or feet.  Musculoskeletal: Pt reports left shoulder pain. Denies difficulty with gait,  muscle pain or joint swelling.  Skin: Denies redness, rashes, lesions or ulcercations.  Neurological: Denies dizziness, difficulty with memory, difficulty with speech or problems with balance and coordination.  Psych: Denies anxiety, depression, SI/HI.  No other specific complaints in a complete review of systems (except as listed in HPI above).   Objective:   Physical Exam   BP 132/84 mmHg  Pulse 72  Temp(Src) 98.3 F (36.8 C) (Oral)  Wt 202 lb (91.627 kg)  SpO2 98% Wt Readings from Last 3 Encounters:  10/29/14 202 lb (91.627 kg)  10/18/14 206 lb 12.8 oz (93.804 kg)  10/12/14 208 lb (94.348 kg)    General: Appears his stated age, well developed, well nourished in NAD. Skin: Warm, dry and intact. No rashes, lesions or ulcerations noted. HEENT: Head: normal shape and size; Eyes: sclera white, no icterus, conjunctiva pink, PERRLA and EOMs intact;  Neck: Neck supple, trachea midline. No masses, lumps present. Thyromegaly noted. Cardiovascular: Normal rate with irregular rhythm. S1,S2 noted.  No murmur, rubs or gallops noted. No JVD or BLE edema. No carotid bruits noted.  Pulmonary/Chest: Normal effort and positive vesicular breath sounds. No respiratory distress. No wheezes, rales or ronchi noted.  Musculoskeletal: Decreased internal and external rotation of the left shoulder. Slightly positive drop can test on the left. Strength 4/5 LEU, 5/5 RUE. Neurological: Alert and oriented.  Psychiatric: Mood and affect normal. Behavior is normal. Judgment and thought content normal.     BMET    Component Value Date/Time   NA 136 08/02/2014 1536   K 4.3 08/02/2014 1536   CL 102 08/02/2014 1536   CO2 29 08/02/2014 1536   GLUCOSE 94 08/02/2014 1536   BUN 12 08/02/2014 1536   CREATININE 1.63* 08/02/2014 1536   CALCIUM 9.5 08/02/2014 1536   GFRNONAA 47* 11/17/2013 1607   GFRAA 54* 11/17/2013 1607    Lipid Panel     Component Value Date/Time   CHOL 160 04/26/2014 0854   TRIG 72.0  04/26/2014 0854   HDL 44.90 04/26/2014 0854   CHOLHDL 4 04/26/2014 0854   VLDL 14.4 04/26/2014 0854   LDLCALC 101* 04/26/2014 0854    CBC    Component Value Date/Time   WBC 9.0 08/02/2014 1536   RBC 4.44 08/02/2014 1536   HGB 13.4 08/02/2014 1536   HCT 38.9* 08/02/2014 1536   PLT 197.0 08/02/2014 1536   MCV 87.7 08/02/2014 1536   MCH 30.0 11/17/2013 1715   MCHC 34.5 08/02/2014 1536   RDW 14.7 08/02/2014 1536   LYMPHSABS 1.8 11/17/2013 1715   MONOABS 0.5 11/17/2013 1715   EOSABS 0.1 11/17/2013 1715   BASOSABS 0.0 11/17/2013 1715  Hgb A1C No results found for: HGBA1C      Assessment & Plan:   Left shoulder pain:  Will obtain xray today Will likely need MRI Continue Tramadol at this time  RTC in 6 months or sooner if needed

## 2014-10-29 NOTE — Assessment & Plan Note (Signed)
Continue Levitra prn

## 2014-10-29 NOTE — Patient Instructions (Signed)

## 2014-10-29 NOTE — Assessment & Plan Note (Signed)
Will check TSH today He declines repeat thyroid ultrasound at this time

## 2014-10-29 NOTE — Assessment & Plan Note (Signed)
Controlled on Norvasc CBC and CMET today ECG- right bundle branch block, unchanged from prior

## 2014-10-29 NOTE — Assessment & Plan Note (Signed)
Rare now Will continue Imitrex prn

## 2014-10-29 NOTE — Assessment & Plan Note (Signed)
Continue to follow with Dr. Halford Chessman

## 2014-10-30 ENCOUNTER — Other Ambulatory Visit: Payer: Self-pay | Admitting: Internal Medicine

## 2014-10-30 ENCOUNTER — Telehealth: Payer: Self-pay

## 2014-10-30 DIAGNOSIS — M25512 Pain in left shoulder: Secondary | ICD-10-CM

## 2014-10-30 NOTE — Telephone Encounter (Signed)
MRI ordered

## 2014-10-30 NOTE — Telephone Encounter (Signed)
Pt called to get results of shoulder xray and pt advised per result note; pt said yes he does want to have an MRI scheduled. Pt will wait for cb from pt care coordinator about appt.

## 2014-11-05 ENCOUNTER — Ambulatory Visit
Admission: RE | Admit: 2014-11-05 | Discharge: 2014-11-05 | Disposition: A | Discharge status: Home / Self Care | Payer: Commercial Managed Care - HMO | Source: Ambulatory Visit | Attending: Internal Medicine | Admitting: Internal Medicine

## 2014-11-05 DIAGNOSIS — M19012 Primary osteoarthritis, left shoulder: Secondary | ICD-10-CM | POA: Diagnosis not present

## 2014-11-05 DIAGNOSIS — M7552 Bursitis of left shoulder: Secondary | ICD-10-CM | POA: Insufficient documentation

## 2014-11-05 DIAGNOSIS — M25512 Pain in left shoulder: Secondary | ICD-10-CM

## 2014-11-05 DIAGNOSIS — S46812A Strain of other muscles, fascia and tendons at shoulder and upper arm level, left arm, initial encounter: Secondary | ICD-10-CM | POA: Diagnosis not present

## 2014-11-07 ENCOUNTER — Other Ambulatory Visit: Payer: Self-pay | Admitting: Internal Medicine

## 2014-11-07 DIAGNOSIS — IMO0001 Reserved for inherently not codable concepts without codable children: Secondary | ICD-10-CM

## 2014-11-07 DIAGNOSIS — S46812D Strain of other muscles, fascia and tendons at shoulder and upper arm level, left arm, subsequent encounter: Principal | ICD-10-CM

## 2015-01-07 ENCOUNTER — Other Ambulatory Visit: Payer: Self-pay | Admitting: Internal Medicine

## 2015-01-18 ENCOUNTER — Ambulatory Visit (INDEPENDENT_AMBULATORY_CARE_PROVIDER_SITE_OTHER): Payer: Commercial Managed Care - HMO

## 2015-01-18 DIAGNOSIS — Z23 Encounter for immunization: Secondary | ICD-10-CM | POA: Diagnosis not present

## 2015-03-28 ENCOUNTER — Encounter: Payer: Self-pay | Admitting: Internal Medicine

## 2015-04-14 HISTORY — PX: COLONOSCOPY: SHX174

## 2015-05-14 ENCOUNTER — Other Ambulatory Visit: Payer: Self-pay | Admitting: Internal Medicine

## 2015-05-31 ENCOUNTER — Encounter: Payer: Self-pay | Admitting: Internal Medicine

## 2015-07-09 ENCOUNTER — Encounter: Payer: Self-pay | Admitting: Internal Medicine

## 2015-07-30 ENCOUNTER — Encounter: Payer: Self-pay | Admitting: Internal Medicine

## 2015-07-30 MED ORDER — AMLODIPINE BESYLATE 5 MG PO TABS: 5.0000 mg | ORAL_TABLET | Freq: Every day | ORAL | Status: DC

## 2015-09-02 ENCOUNTER — Telehealth: Payer: Self-pay

## 2015-09-02 NOTE — Telephone Encounter (Signed)
Pt request refill amlodipine; pt last seen 10/29/14. pt has appt on 09/12/15; last filled # 30 x 1 on 07/30/15. [facility]. Pt voiced understanding and will keep 09/12/15 appt.

## 2015-09-05 ENCOUNTER — Ambulatory Visit: Payer: Commercial Managed Care - HMO | Admitting: Internal Medicine

## 2015-09-05 ENCOUNTER — Other Ambulatory Visit: Payer: Self-pay | Admitting: Internal Medicine

## 2015-09-12 ENCOUNTER — Encounter: Payer: Self-pay | Admitting: Internal Medicine

## 2015-09-12 ENCOUNTER — Ambulatory Visit (INDEPENDENT_AMBULATORY_CARE_PROVIDER_SITE_OTHER): Payer: Commercial Managed Care - HMO | Admitting: Internal Medicine

## 2015-09-12 ENCOUNTER — Other Ambulatory Visit: Payer: Self-pay | Admitting: Internal Medicine

## 2015-09-12 VITALS — BP 128/84 | HR 76 | Temp 98.0°F | Wt 208.0 lb

## 2015-09-12 DIAGNOSIS — Z0001 Encounter for general adult medical examination with abnormal findings: Secondary | ICD-10-CM | POA: Diagnosis not present

## 2015-09-12 DIAGNOSIS — G4733 Obstructive sleep apnea (adult) (pediatric): Secondary | ICD-10-CM

## 2015-09-12 DIAGNOSIS — I10 Essential (primary) hypertension: Secondary | ICD-10-CM | POA: Diagnosis not present

## 2015-09-12 DIAGNOSIS — Z23 Encounter for immunization: Secondary | ICD-10-CM

## 2015-09-12 DIAGNOSIS — N529 Male erectile dysfunction, unspecified: Secondary | ICD-10-CM

## 2015-09-12 DIAGNOSIS — Z125 Encounter for screening for malignant neoplasm of prostate: Secondary | ICD-10-CM | POA: Diagnosis not present

## 2015-09-12 DIAGNOSIS — E042 Nontoxic multinodular goiter: Secondary | ICD-10-CM | POA: Diagnosis not present

## 2015-09-12 DIAGNOSIS — N289 Disorder of kidney and ureter, unspecified: Secondary | ICD-10-CM

## 2015-09-12 DIAGNOSIS — Z0000 Encounter for general adult medical examination without abnormal findings: Secondary | ICD-10-CM | POA: Diagnosis not present

## 2015-09-12 DIAGNOSIS — G43809 Other migraine, not intractable, without status migrainosus: Secondary | ICD-10-CM | POA: Diagnosis not present

## 2015-09-12 DIAGNOSIS — Z1159 Encounter for screening for other viral diseases: Secondary | ICD-10-CM | POA: Diagnosis not present

## 2015-09-12 LAB — CBC
HCT: 39.1 % (ref 39.0–52.0)
HEMOGLOBIN: 13.2 g/dL (ref 13.0–17.0)
MCHC: 33.8 g/dL (ref 30.0–36.0)
MCV: 87.8 fl (ref 78.0–100.0)
PLATELETS: 216 10*3/uL (ref 150.0–400.0)
RBC: 4.45 Mil/uL (ref 4.22–5.81)
RDW: 14.6 % (ref 11.5–15.5)
WBC: 4.8 10*3/uL (ref 4.0–10.5)

## 2015-09-12 LAB — COMPREHENSIVE METABOLIC PANEL
ALT: 23 U/L (ref 0–53)
AST: 25 U/L (ref 0–37)
Albumin: 4.3 g/dL (ref 3.5–5.2)
Alkaline Phosphatase: 45 U/L (ref 39–117)
BUN: 18 mg/dL (ref 6–23)
CHLORIDE: 105 meq/L (ref 96–112)
CO2: 30 meq/L (ref 19–32)
CREATININE: 1.48 mg/dL (ref 0.40–1.50)
Calcium: 9.5 mg/dL (ref 8.4–10.5)
GFR: 60.62 mL/min (ref >60.00)
GLUCOSE: 97 mg/dL (ref 70–99)
Potassium: 4.4 mEq/L (ref 3.5–5.1)
SODIUM: 140 meq/L (ref 135–145)
Total Bilirubin: 1 mg/dL (ref 0.2–1.2)
Total Protein: 7.1 g/dL (ref 6.0–8.3)

## 2015-09-12 LAB — LIPID PANEL
CHOL/HDL RATIO: 3
Cholesterol: 157 mg/dL (ref 0–200)
HDL: 49.9 mg/dL (ref >39.00)
LDL CALC: 95 mg/dL (ref 0–99)
NonHDL: 106.93
Triglycerides: 62 mg/dL (ref 0.0–149.0)
VLDL: 12.4 mg/dL (ref 0.0–40.0)

## 2015-09-12 LAB — PSA, MEDICARE: PSA: 1.02 ng/mL (ref 0.10–4.00)

## 2015-09-12 MED ORDER — SUMATRIPTAN SUCCINATE 100 MG PO TABS: 100.0000 mg | ORAL_TABLET | ORAL | Status: DC | PRN

## 2015-09-12 MED ORDER — SILDENAFIL CITRATE 20 MG PO TABS: ORAL_TABLET | ORAL | Status: DC

## 2015-09-12 MED ORDER — AMLODIPINE BESYLATE 5 MG PO TABS: 5.0000 mg | ORAL_TABLET | Freq: Every day | ORAL | Status: DC

## 2015-09-12 NOTE — Assessment & Plan Note (Signed)
CMET today 

## 2015-09-12 NOTE — Assessment & Plan Note (Signed)
Will switch to Revatio from Levitra due to cost RX sent to [facility]

## 2015-09-12 NOTE — Assessment & Plan Note (Signed)
He will continue use of a dental device

## 2015-09-12 NOTE — Assessment & Plan Note (Signed)
TSH today Thyroid ultrasound ordered

## 2015-09-12 NOTE — Progress Notes (Signed)
Pre visit review using our clinic review tool, if applicable. No additional management support is needed unless otherwise documented below in the visit note. 

## 2015-09-12 NOTE — Patient Instructions (Signed)

## 2015-09-12 NOTE — Assessment & Plan Note (Signed)
Controlled with Imitrex prn

## 2015-09-12 NOTE — Progress Notes (Signed)
Subjective:    Patient ID: [name], male    DOB: [birth year], 70 y.o.   MRN: [id number]  HPI  Pt presents to the clinic today for his annual exam. He is also due for follow up of chronic conditions.  HTN: BP well controlled on Norvasc. His BP today is 128/84. He denies chest pain, chest tightness or shortness of breath.  Thyroid Nodules: His TSH has been normal. His last thyroid ultrasound was 07/21/2012. He denies fatigue, constipation, weight gain, cold sensation or depression.  Migraines: He has not had migraines in quite a while. He uses Imitrex as abortive therapy with good relief.  OSA: He uses a dental device, not CPAP. He does follow with Dr. Halford Chessman, last note from 07-17-2014 reviewed.  CRI: Last creatinine is 1.34, down from 1.6.  ED: He uses Levitra prn but he feels like it is not as effective as it used to be. He also reports it was very expensive, 490.00 for 10 pills. He would like to know if there is an alternative.  Flu: 01/2015 Tetanus: 16-Jul-2009 Pneumovax: 07/16/2012 Prevnar: never Zostavax: 07-17-2007 PSA Screening: 17-Jul-2014 Colon Screening: 17-Jul-2010, 5 years- brother died of colon cancer Vision Screening: > 1 year ago Dentist: annually   Review of Systems      Past Medical History  Diagnosis Date  . Chicken pox   . Migraine   . HTN (hypertension)     a. 11/2010 Echo: EF 55-65%.  . ED (erectile dysfunction)   . Obstructive sleep apnea   . CKD (chronic kidney disease), stage III     Current Outpatient Prescriptions  Medication Sig Dispense Refill  . acetaminophen (TYLENOL) 500 MG tablet Take 500 mg by mouth every 6 (six) hours as needed for moderate pain.    Marland Kitchen amLODipine (NORVASC) 5 MG tablet Take 1 tablet (5 mg total) by mouth daily. 30 tablet 0  . aspirin 81 MG tablet Take 81 mg by mouth daily.    . cholecalciferol (VITAMIN D) 1000 UNITS tablet Take 1,000 Units by mouth daily.    Marland Kitchen HYDROcodone-acetaminophen (NORCO/VICODIN) 5-325 MG per tablet Take 1 tablet by mouth  every 6 (six) hours as needed for moderate pain. 30 tablet 0  . SUMAtriptan (IMITREX) 100 MG tablet TAKE 1/2 TO 1 TABLET BY MOUTH FOR MIGRAINE.MAY REPEAT 2 HOURS LATER. NO MORE THAN 200MG /DAY 9 tablet 0  . traMADol (ULTRAM) 50 MG tablet Take 1 tablet (50 mg total) by mouth every 6 (six) hours as needed. 50 tablet 2   No current facility-administered medications for this visit.    No Known Allergies  Family History  Problem Relation Age of Onset  . Arthritis Mother   . Cancer Mother   . Hypertension Mother   . Colon cancer Brother     Social History   Social History  . Marital Status: Married    Spouse Name: N/A  . Number of Children: N/A  . Years of Education: N/A   Occupational History  . retired     Adult nurse   Social History Main Topics  . Smoking status: Former Smoker -- 0.50 packs/day for 10 years    Types: Cigarettes    Quit date: 07-16-76  . Smokeless tobacco: Never Used     Comment: Quit 1978  . Alcohol Use: No  . Drug Use: No  . Sexual Activity: Yes   Other Topics Concern  . 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  Collegiate football player, 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  HEENT: Pt reports decreased hearing (has hearing aids). Denies eye pain, eye redness, ear pain, ringing in the ears, wax buildup, runny nose, nasal congestion, bloody nose, or sore throat. Respiratory: Denies difficulty breathing, shortness of breath, cough or sputum production.   Cardiovascular: Denies chest pain, chest tightness, palpitations or swelling in the hands or feet.  Gastrointestinal: Denies abdominal pain, bloating, constipation, diarrhea or blood in the stool.  GU: Pt reports erectile dysfunction. Denies urgency, frequency, pain with urination, burning sensation, blood in urine, odor or discharge. Musculoskeletal: Denies decrease in  range of motion, difficulty with gait, muscle pain or joint pain and swelling.  Skin: Denies redness, rashes, lesions or ulcercations.  Neurological: Denies dizziness, difficulty with memory, difficulty with speech or problems with balance and coordination.  Psych: Denies anxiety, depression, SI/HI.  No other specific complaints in a complete review of systems (except as listed in HPI above).    Objective:   Physical Exam  BP 128/84 mmHg  Pulse 76  Temp(Src) 98 F (36.7 C) (Oral)  Wt 208 lb (94.348 kg)  SpO2 98% Wt Readings from Last 3 Encounters:  09/12/15 208 lb (94.348 kg)  10/29/14 202 lb (91.627 kg)  10/18/14 206 lb 12.8 oz (93.804 kg)    General: Appears his stated age, well developed, well nourished in NAD. Skin: Warm, dry and intact.  HEENT: Head: normal shape and size; Eyes: sclera white, no icterus, conjunctiva pink, PERRLA and EOMs intact; Ears: Tm's gray and intact, normal light reflex; Throat/Mouth: Teeth present, mucosa pink and moist, no exudate, lesions or ulcerations noted.  Neck:  Neck supple, trachea midline. No masses, lumps or thyromegaly present.  Cardiovascular: Normal rate and rhythm. S1,S2 noted.  No murmur, rubs or gallops noted. No BLE edema. No carotid bruits noted. Pulmonary/Chest: Normal effort and positive vesicular breath sounds. No respiratory distress. No wheezes, rales or ronchi noted.  Abdomen: Soft and nontender. Normal bowel sounds. No distention or masses noted. Liver, spleen and kidneys non palpable. Rectal: Normal rectal tone. Prostate mildly enlarged but firm and nontender. Musculoskeletal: Strength 5/5 BUE/BLE. No signs of joint swelling. No difficulty with gait.  Neurological: Alert and oriented. Cranial nerves II-XII grossly intact. Coordination normal.  Psychiatric: Mood and affect normal. Behavior is normal. Judgment and thought content normal.     BMET    Component Value Date/Time   NA 140 09/12/2015 1013   K 4.4 09/12/2015 1013    CL 105 09/12/2015 1013   CO2 30 09/12/2015 1013   GLUCOSE 97 09/12/2015 1013   BUN 18 09/12/2015 1013   CREATININE 1.48 09/12/2015 1013   CALCIUM 9.5 09/12/2015 1013   GFRNONAA 47* 11/17/2013 1607   GFRAA 54* 11/17/2013 1607    Lipid Panel     Component Value Date/Time   CHOL 157 09/12/2015 1013   TRIG 62.0 09/12/2015 1013   HDL 49.90 09/12/2015 1013   CHOLHDL 3 09/12/2015 1013   VLDL 12.4 09/12/2015 1013   LDLCALC 95 09/12/2015 1013    CBC    Component Value Date/Time   WBC 4.8 09/12/2015 1013   RBC 4.45 09/12/2015 1013   HGB 13.2 09/12/2015 1013   HCT 39.1 09/12/2015 1013   PLT 216.0 09/12/2015 1013   MCV 87.8 09/12/2015 1013   MCH 30.0 11/17/2013 1715   MCHC 33.8 09/12/2015 1013   RDW  14.6 09/12/2015 1013   LYMPHSABS 1.8 10/29/2014 0942   MONOABS 0.4 10/29/2014 0942   EOSABS 0.1 10/29/2014 0942   BASOSABS 0.0 10/29/2014 0942    Hgb A1C No results found for: HGBA1C     Assessment & Plan:   Preventative Health Maintenance:  Flu, Tetanus, Pneumovax and Zostovax UTD Prevnar today Referral placed to GI for colonoscopy Encouraged him to consume a balanced diet and start an exercise regimen Referral placed to optometry for vision screening Encouraged him to see a dentist annually Will check CBC, CMET, Lipid, PSA, and Hep C today  RTC in 6 months to follow up chronic conditions

## 2015-09-12 NOTE — Assessment & Plan Note (Signed)
Continue Norvasc

## 2015-09-13 LAB — HEPATITIS C ANTIBODY: HCV Ab: NEGATIVE

## 2015-09-17 ENCOUNTER — Ambulatory Visit
Admission: RE | Admit: 2015-09-17 | Discharge: 2015-09-17 | Disposition: A | Discharge status: Home / Self Care | Payer: Commercial Managed Care - HMO | Source: Ambulatory Visit | Attending: Internal Medicine | Admitting: Internal Medicine

## 2015-09-17 DIAGNOSIS — E042 Nontoxic multinodular goiter: Secondary | ICD-10-CM

## 2015-09-17 DIAGNOSIS — R938 Abnormal findings on diagnostic imaging of other specified body structures: Secondary | ICD-10-CM | POA: Insufficient documentation

## 2015-09-18 ENCOUNTER — Ambulatory Visit: Payer: Commercial Managed Care - HMO

## 2015-09-18 ENCOUNTER — Other Ambulatory Visit: Payer: Self-pay | Admitting: Internal Medicine

## 2015-09-18 DIAGNOSIS — E041 Nontoxic single thyroid nodule: Secondary | ICD-10-CM

## 2015-09-24 ENCOUNTER — Telehealth: Payer: Self-pay | Admitting: Gastroenterology

## 2015-09-24 ENCOUNTER — Encounter: Payer: Self-pay | Admitting: Gastroenterology

## 2015-09-24 NOTE — Telephone Encounter (Signed)
Colonoscopy scheduled for September.   Records reviewed by Dr. Loletha Carrow and approved should be scheduled in 12/2015. Left message for patient we will call back when Sept. Calendar is available. VC

## 2015-09-26 ENCOUNTER — Encounter: Payer: Self-pay | Admitting: Internal Medicine

## 2015-09-27 ENCOUNTER — Other Ambulatory Visit: Payer: Self-pay | Admitting: Internal Medicine

## 2015-09-27 ENCOUNTER — Ambulatory Visit
Admission: RE | Admit: 2015-09-27 | Discharge: 2015-09-27 | Disposition: A | Discharge status: Home / Self Care | Payer: Commercial Managed Care - HMO | Source: Ambulatory Visit | Attending: Internal Medicine | Admitting: Internal Medicine

## 2015-09-27 DIAGNOSIS — E041 Nontoxic single thyroid nodule: Secondary | ICD-10-CM | POA: Diagnosis present

## 2015-09-27 NOTE — Discharge Instructions (Signed)

## 2015-09-27 NOTE — Sedation Documentation (Signed)
Nodules do not meet biopsy criteria-per Dr. Pascal Lux.  Procedure cancelled.

## 2015-10-22 ENCOUNTER — Ambulatory Visit: Payer: Commercial Managed Care - HMO

## 2015-12-06 ENCOUNTER — Encounter: Payer: Self-pay | Admitting: Gastroenterology

## 2015-12-06 ENCOUNTER — Ambulatory Visit (AMBULATORY_SURGERY_CENTER): Payer: Self-pay

## 2015-12-06 VITALS — Ht 71.5 in | Wt 206.6 lb

## 2015-12-06 DIAGNOSIS — Z800 Family history of malignant neoplasm of digestive organs: Secondary | ICD-10-CM

## 2015-12-06 NOTE — Progress Notes (Signed)
No allergies to eggs or soy No past problems with anesthesia No home oxygen No diet meds  Has email and internet; declined emmi 

## 2015-12-20 ENCOUNTER — Ambulatory Visit (AMBULATORY_SURGERY_CENTER): Payer: Commercial Managed Care - HMO | Admitting: Gastroenterology

## 2015-12-20 ENCOUNTER — Encounter: Payer: Self-pay | Admitting: Gastroenterology

## 2015-12-20 VITALS — BP 121/79 | HR 54 | Temp 96.8°F | Resp 20 | Ht 71.5 in | Wt 206.0 lb

## 2015-12-20 DIAGNOSIS — Z800 Family history of malignant neoplasm of digestive organs: Secondary | ICD-10-CM

## 2015-12-20 DIAGNOSIS — Z1211 Encounter for screening for malignant neoplasm of colon: Secondary | ICD-10-CM

## 2015-12-20 DIAGNOSIS — D123 Benign neoplasm of transverse colon: Secondary | ICD-10-CM | POA: Diagnosis not present

## 2015-12-20 MED ORDER — SODIUM CHLORIDE 0.9 % IV SOLN: 500.0000 mL | INTRAVENOUS | Status: DC

## 2015-12-20 NOTE — Patient Instructions (Signed)
YOU HAD AN ENDOSCOPIC PROCEDURE TODAY AT THE [facility]:   Refer to the procedure report that was given to you for any specific questions about what was found during the examination.  If the procedure report does not answer your questions, please call your gastroenterologist to clarify.  If you requested that your care partner not be given the details of your procedure findings, then the procedure report has been included in a sealed envelope for you to review at your convenience later.  YOU SHOULD EXPECT: Some feelings of bloating in the abdomen. Passage of more gas than usual.  Walking can help get rid of the air that was put into your GI tract during the procedure and reduce the bloating. If you had a lower endoscopy (such as a colonoscopy or flexible sigmoidoscopy) you may notice spotting of blood in your stool or on the toilet paper. If you underwent a bowel prep for your procedure, you may not have a normal bowel movement for a few days.  Please Note:  You might notice some irritation and congestion in your nose or some drainage.  This is from the oxygen used during your procedure.  There is no need for concern and it should clear up in a day or so.  SYMPTOMS TO REPORT IMMEDIATELY:   Following lower endoscopy (colonoscopy or flexible sigmoidoscopy):  Excessive amounts of blood in the stool  Significant tenderness or worsening of abdominal pains  Swelling of the abdomen that is new, acute  Fever of 100F or higher  For urgent or emergent issues, a gastroenterologist can be reached at any hour by calling [phone].   DIET:  We do recommend a small meal at first, but then you may proceed to your regular diet.  Drink plenty of fluids but you should avoid alcoholic beverages for 24 hours.  ACTIVITY:  You should plan to take it easy for the rest of today and you should NOT DRIVE or use heavy machinery until tomorrow (because of the sedation medicines used during the test).     FOLLOW UP: Our staff will call the number listed on your records the next business day following your procedure to check on you and address any questions or concerns that you may have regarding the information given to you following your procedure. If we do not reach you, we will leave a message.  However, if you are feeling well and you are not experiencing any problems, there is no need to return our call.  We will assume that you have returned to your regular daily activities without incident.  If any biopsies were taken you will be contacted by phone or by letter within the next 1-3 weeks.  Please call us at [phone] if you have not heard about the biopsies in 3 weeks.    SIGNATURES/CONFIDENTIALITY: You and/or your care partner have signed paperwork which will be entered into your electronic medical record.  These signatures attest to the fact that that the information above on your After Visit Summary has been reviewed and is understood.  Full responsibility of the confidentiality of this discharge information lies with you and/or your care-partner. 

## 2015-12-20 NOTE — Op Note (Signed)
[name] Patient Name: [name] Procedure Date: 12/20/2015 10:16 AM MRN: [id number] Endoscopist: Mallie Mussel L. [name] , MD Age: 70 Referring MD:  Date of Birth: 1945-08-16 Gender: Male Account #: [id number] Procedure:                Colonoscopy Indications:              Screening in patient at increased risk: Colorectal                            cancer in brother 30 or older Medicines:                Monitored Anesthesia Care Procedure:                Pre-Anesthesia Assessment:                           - Prior to the procedure, a History and Physical                            was performed, and patient medications and                            allergies were reviewed. The patient's tolerance of                            previous anesthesia was also reviewed. The risks                            and benefits of the procedure and the sedation                            options and risks were discussed with the patient.                            All questions were answered, and informed consent                            was obtained. Prior Anticoagulants: The patient has                            taken no previous anticoagulant or antiplatelet                            agents. ASA Grade Assessment: II - A patient with                            mild systemic disease. After reviewing the risks                            and benefits, the patient was deemed in                            satisfactory condition to undergo the procedure.  After obtaining informed consent, the colonoscope                            was passed under direct vision. Throughout the                            procedure, the patient's blood pressure, pulse, and                            oxygen saturations were monitored continuously. The                            Model CF-HQ190L [id number]) scope was introduced                            through the anus and  advanced to the the cecum,                            identified by appendiceal orifice and ileocecal                            valve. The colonoscopy was performed without                            difficulty. The patient tolerated the procedure                            well. The quality of the bowel preparation was                            excellent. The ileocecal valve, appendiceal                            orifice, and rectum were photographed. The quality                            of the bowel preparation was evaluated using the                            BBPS [facility] Bowel Preparation Scale) with scores                            of: Right Colon = 3, Transverse Colon = 3 and Left                            Colon = 3 (entire mucosa seen well with no residual                            staining, small fragments of stool or opaque                            liquid). The total BBPS score equals 9. The bowel  preparation used was SUPREP. Scope In: 10:28:33 AM Scope Out: 10:42:11 AM Scope Withdrawal Time: 0 hours 9 minutes 19 seconds  Total Procedure Duration: 0 hours 13 minutes 38 seconds  Findings:                 The perianal and digital rectal examinations were                            normal.                           A few diverticula were found in the left colon.                           A 2 mm polyp was found in the hepatic flexure. The                            polyp was sessile. The polyp was removed with a                            piecemeal technique using a cold biopsy forceps.                            Resection and retrieval were complete.                           The exam was otherwise without abnormality on                            direct and retroflexion views. Complications:            No immediate complications. Estimated Blood Loss:     Estimated blood loss: none. Estimated blood loss:                             none. Impression:               - No specimens collected. Recommendation:           - Patient has a contact number available for                            emergencies. The signs and symptoms of potential                            delayed complications were discussed with the                            patient. Return to normal activities tomorrow.                            Written discharge instructions were provided to the                            patient.                           -  Resume previous diet.                           - Continue present medications.                           - Await pathology results.                           - Repeat colonoscopy in 5 years. Deen Deguia L. Andrew Carrow, MD 12/20/2015 10:46:51 AM This report has been signed electronically.

## 2015-12-20 NOTE — Progress Notes (Signed)
Called to room to assist during endoscopic procedure.  Patient ID and intended procedure confirmed with present staff. Received instructions for my participation in the procedure from the performing physician.  

## 2015-12-20 NOTE — Progress Notes (Signed)
A and O x3. Report to RN. Tolerated MAC anesthesia well. 

## 2015-12-23 ENCOUNTER — Telehealth: Payer: Self-pay | Admitting: *Deleted

## 2015-12-23 NOTE — Telephone Encounter (Signed)
  Follow up Call-  Call back number 12/20/2015  Post procedure Call Back phone  # [phone]  Permission to leave phone message Yes  Some recent data might be hidden     Patient questions:  Do you have a fever, pain , or abdominal swelling? No. Pain Score  0 *  Have you tolerated food without any problems? Yes.    Have you been able to return to your normal activities? Yes.    Do you have any questions about your discharge instructions: Diet   No. Medications  No. Follow up visit  No.  Do you have questions or concerns about your Care? No.  Actions: * If pain score is 4 or above: No action needed, pain <4.

## 2015-12-26 ENCOUNTER — Encounter: Payer: Self-pay | Admitting: Gastroenterology

## 2016-01-10 ENCOUNTER — Ambulatory Visit (INDEPENDENT_AMBULATORY_CARE_PROVIDER_SITE_OTHER): Payer: Commercial Managed Care - HMO

## 2016-01-10 DIAGNOSIS — Z23 Encounter for immunization: Secondary | ICD-10-CM | POA: Diagnosis not present

## 2016-03-16 ENCOUNTER — Encounter: Payer: Self-pay | Admitting: Internal Medicine

## 2016-03-20 ENCOUNTER — Encounter: Payer: Self-pay | Admitting: Pulmonary Disease

## 2016-03-20 ENCOUNTER — Ambulatory Visit (INDEPENDENT_AMBULATORY_CARE_PROVIDER_SITE_OTHER): Payer: Commercial Managed Care - HMO | Admitting: Pulmonary Disease

## 2016-03-20 VITALS — BP 118/74 | HR 60 | Ht 71.5 in | Wt 212.2 lb

## 2016-03-20 DIAGNOSIS — G4733 Obstructive sleep apnea (adult) (pediatric): Secondary | ICD-10-CM

## 2016-03-20 NOTE — Patient Instructions (Signed)
Call if you want to try getting CPAP set up  Follow up in 1 year

## 2016-03-20 NOTE — Progress Notes (Signed)
Current Outpatient Prescriptions on File Prior to Visit  Medication Sig  . acetaminophen (TYLENOL) 500 MG tablet Take 500 mg by mouth every 6 (six) hours as needed for moderate pain.  Marland Kitchen amLODipine (NORVASC) 5 MG tablet Take 1 tablet (5 mg total) by mouth daily.  Marland Kitchen aspirin 81 MG tablet Take 81 mg by mouth daily.  . cholecalciferol (VITAMIN D) 1000 UNITS tablet Take 1,000 Units by mouth daily.  . sildenafil (REVATIO) 20 MG tablet Take 2-5 tablets as needed for sexual intercourse  . SUMAtriptan (IMITREX) 100 MG tablet Take 1 tablet (100 mg total) by mouth every 2 (two) hours as needed for migraine. May repeat in 2 hours if headache persists or recurs.   Current Facility-Administered Medications on File Prior to Visit  Medication  . 0.9 %  sodium chloride infusion    Chief Complaint  Patient presents with  . CPAP    Wants to discuss CPAP and supplies. Currently does not have a machine. Pt. does not have a DME preference.     Sleep tests PSG 09/21/13 >> AHI 8.9, SaO2 low 88%. REM effect.   Past medical history Headaches, HTN, CKD stage III, Goiter  Past surgical history, Family history, Social history, Allergies reviewed  Vital signs BP 118/74 (BP Location: Left Arm, Patient Position: Sitting, Cuff Size: Normal)   Pulse 60   Ht 5' 11.5" (1.816 m)   Wt 212 lb 3.2 oz (96.3 kg)   SpO2 98%   BMI 29.18 kg/m   History of Present Illness: [name] is a 70 y.o. male with obstructive sleep apnea.  He has been using oral appliance.  He feels this helps his sleep and he is not snoring.  He has noticed developed malalignement of his bite.  He has been working with Dr. Ron Parker, and got a different dental aligner.  He has f/u with Dr. Ron Parker in 2 months.  Physical Exam:  General - No distress ENT - No sinus tenderness, no oral exudate, no LAN, MP 3, enlarged tongue, high arched palate, decreased AP diameter Cardiac - s1s2 regular, no murmur Chest - No wheeze/rales/dullness Back - No  focal tenderness Abd - Soft, non-tender Ext - No edema Neuro - Normal strength Skin - No rashes Psych - normal mood, and behavior   Assessment/Plan:  Obstructive sleep apnea. - had detailed discussion about pros/cons for continuing oral appliance versus switching to CPAP - he will continue to work with oral appliance for now - explained he might need repeat sleep study first if he wanted to switch to CPAP   Patient Instructions  Call if you want to try getting CPAP set up  Follow up in 1 year   Chesley Mires, MD [facility] Pager:  [phone] 03/20/2016, 10:46 AM

## 2016-05-12 ENCOUNTER — Other Ambulatory Visit: Payer: Self-pay | Admitting: Internal Medicine

## 2016-06-16 ENCOUNTER — Telehealth: Payer: Self-pay | Admitting: Internal Medicine

## 2016-06-16 NOTE — Telephone Encounter (Signed)
Left pt message asking to call Allison back directly at [phone] to schedule AWV.+ labs with Lesia and CPE with PCP. °

## 2016-07-07 DIAGNOSIS — R69 Illness, unspecified: Secondary | ICD-10-CM | POA: Diagnosis not present

## 2016-08-04 NOTE — Telephone Encounter (Signed)
Spoke to household member. She will have pt call me back to schedule yearly appt after 09/11/16 (+AWV)

## 2016-09-11 ENCOUNTER — Other Ambulatory Visit: Payer: Self-pay

## 2016-09-11 MED ORDER — AMLODIPINE BESYLATE 5 MG PO TABS: 5.0000 mg | ORAL_TABLET | Freq: Every day | ORAL | 1 refills | Status: DC

## 2016-09-28 ENCOUNTER — Other Ambulatory Visit: Payer: Self-pay | Admitting: Internal Medicine

## 2016-10-06 ENCOUNTER — Other Ambulatory Visit: Payer: Self-pay | Admitting: Internal Medicine

## 2016-10-07 ENCOUNTER — Other Ambulatory Visit: Payer: Self-pay

## 2016-10-07 ENCOUNTER — Encounter: Payer: Self-pay | Admitting: Internal Medicine

## 2016-10-07 NOTE — Telephone Encounter (Signed)
Pt called to request a refill of amlodipine. He said the pharmacy told him he needed to contact our office for this. It looks like the refill was already. Called pt to let him know.

## 2016-11-03 DIAGNOSIS — R69 Illness, unspecified: Secondary | ICD-10-CM | POA: Diagnosis not present

## 2016-11-09 DIAGNOSIS — R69 Illness, unspecified: Secondary | ICD-10-CM | POA: Diagnosis not present

## 2016-11-26 ENCOUNTER — Encounter: Payer: Self-pay | Admitting: Internal Medicine

## 2016-11-26 ENCOUNTER — Ambulatory Visit (INDEPENDENT_AMBULATORY_CARE_PROVIDER_SITE_OTHER): Payer: Medicare HMO | Admitting: Internal Medicine

## 2016-11-26 VITALS — BP 122/80 | HR 61 | Temp 98.1°F | Ht 70.75 in | Wt 209.0 lb

## 2016-11-26 DIAGNOSIS — G4733 Obstructive sleep apnea (adult) (pediatric): Secondary | ICD-10-CM

## 2016-11-26 DIAGNOSIS — I10 Essential (primary) hypertension: Secondary | ICD-10-CM | POA: Diagnosis not present

## 2016-11-26 DIAGNOSIS — Z125 Encounter for screening for malignant neoplasm of prostate: Secondary | ICD-10-CM | POA: Diagnosis not present

## 2016-11-26 DIAGNOSIS — N529 Male erectile dysfunction, unspecified: Secondary | ICD-10-CM | POA: Diagnosis not present

## 2016-11-26 DIAGNOSIS — G43C1 Periodic headache syndromes in child or adult, intractable: Secondary | ICD-10-CM | POA: Diagnosis not present

## 2016-11-26 DIAGNOSIS — N289 Disorder of kidney and ureter, unspecified: Secondary | ICD-10-CM | POA: Diagnosis not present

## 2016-11-26 DIAGNOSIS — Z0000 Encounter for general adult medical examination without abnormal findings: Secondary | ICD-10-CM

## 2016-11-26 DIAGNOSIS — E042 Nontoxic multinodular goiter: Secondary | ICD-10-CM | POA: Diagnosis not present

## 2016-11-26 LAB — COMPREHENSIVE METABOLIC PANEL
ALK PHOS: 42 U/L (ref 39–117)
ALT: 17 U/L (ref 0–53)
AST: 20 U/L (ref 0–37)
Albumin: 4 g/dL (ref 3.5–5.2)
BUN: 15 mg/dL (ref 6–23)
CALCIUM: 9.9 mg/dL (ref 8.4–10.5)
CHLORIDE: 105 meq/L (ref 96–112)
CO2: 29 mEq/L (ref 19–32)
Creatinine, Ser: 1.35 mg/dL (ref 0.40–1.50)
GFR: 67.17 mL/min (ref >60.00)
Glucose, Bld: 91 mg/dL (ref 70–99)
POTASSIUM: 4.1 meq/L (ref 3.5–5.1)
Sodium: 139 mEq/L (ref 135–145)
Total Bilirubin: 1 mg/dL (ref 0.2–1.2)
Total Protein: 6.4 g/dL (ref 6.0–8.3)

## 2016-11-26 LAB — CBC
HEMATOCRIT: 38.4 % — AB (ref 39.0–52.0)
HEMOGLOBIN: 13.1 g/dL (ref 13.0–17.0)
MCHC: 34 g/dL (ref 30.0–36.0)
MCV: 89.2 fl (ref 78.0–100.0)
PLATELETS: 207 10*3/uL (ref 150.0–400.0)
RBC: 4.3 Mil/uL (ref 4.22–5.81)
RDW: 14.7 % (ref 11.5–15.5)
WBC: 5.3 10*3/uL (ref 4.0–10.5)

## 2016-11-26 LAB — LIPID PANEL
CHOLESTEROL: 149 mg/dL (ref 0–200)
HDL: 48.2 mg/dL (ref >39.00)
LDL CALC: 72 mg/dL (ref 0–99)
NonHDL: 100.71
TRIGLYCERIDES: 143 mg/dL (ref 0.0–149.0)
Total CHOL/HDL Ratio: 3
VLDL: 28.6 mg/dL (ref 0.0–40.0)

## 2016-11-26 LAB — PSA, MEDICARE: PSA: 1.17 ng/ml (ref 0.10–4.00)

## 2016-11-26 NOTE — Assessment & Plan Note (Signed)
Controlled on Amlodipine CMET today 

## 2016-11-26 NOTE — Progress Notes (Signed)
HPI:  Pt presents to the clinic today for his Medicare Wellness Exam. He is also due to follow up chronic conditions.  HTN: His BP today is 122/80. He is taking Amlodipine as prescribed. ECG from 10/2014 reviewed.  Thyroid Nodules: Ultrasound from 09/2015 reviewed. His TSH has been normal.   Migraines: He reports these have improved. He very rarely has headaches anymore but he has Imitrex to take as needed.  OSA: He reports this is controlled with a dental device, not a CPAP machine. He averages 7 hours of sleep per night. He follows with Dr. Halford Chessman, note from 03/2016 reviewed.  CRI: His last creatinine was 1.48 up from 1.34.  ED: He has issues maintaining erections. He uses Revatio as needed.  Past Medical History:  Diagnosis Date  . Chicken pox   . ED (erectile dysfunction)   . HTN (hypertension)    a. 11/2010 Echo: EF 55-65%.  . Obstructive sleep apnea     Current Outpatient Prescriptions  Medication Sig Dispense Refill  . acetaminophen (TYLENOL) 500 MG tablet Take 500 mg by mouth every 6 (six) hours as needed for moderate pain.    Marland Kitchen amLODipine (NORVASC) 5 MG tablet Take 1 tablet (5 mg total) by mouth daily. MUST SCHEDULE ANNUAL EXAM FOR FURTHER REFILLS 30 tablet 1  . amLODipine (NORVASC) 5 MG tablet TAKE 1 TABLET (5 MG TOTAL) BY MOUTH DAILY. 30 tablet 1  . aspirin 81 MG tablet Take 81 mg by mouth daily.    . cholecalciferol (VITAMIN D) 1000 UNITS tablet Take 1,000 Units by mouth daily.    . sildenafil (REVATIO) 20 MG tablet Take 2-5 tablets as needed for sexual intercourse 50 tablet 0  . SUMAtriptan (IMITREX) 100 MG tablet Take 1 tablet (100 mg total) by mouth every 2 (two) hours as needed for migraine. May repeat in 2 hours if headache persists or recurs. 10 tablet 5   Current Facility-Administered Medications  Medication Dose Route Frequency Provider Last Rate Last Dose  . 0.9 %  sodium chloride infusion  500 mL Intravenous Continuous Danis, Estill Cotta III, MD        No Known  Allergies  Family History  Problem Relation Age of Onset  . Arthritis Mother   . Cancer Mother   . Hypertension Mother   . Colon cancer Brother     Social History   Social History  . Marital status: Married    Spouse name: N/A  . Number of children: N/A  . Years of education: N/A   Occupational History  . retired     Adult nurse   Social History Main Topics  . Smoking status: Former Smoker    Packs/day: 0.50    Years: 10.00    Types: Cigarettes    Quit date: 07/12/1976  . Smokeless tobacco: Never Used     Comment: Quit 1978  . Alcohol use No  . Drug use: No  . Sexual activity: Yes   Other Topics Concern  . 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 many years in [place]: None  Health Maintenance:    Flu: 12/2015  Tetanus: 04/2009  Pneumovax: 04/2012  Prevnar: 09/2015  Zostavax: 01/2008  PSA: 09/2015  Colon Screening: 12/2015  Eye Doctor: 11/2015 at [facility] Exam: biannually   Providers:   PCP: Webb Silversmith, NP-C  Pulmonologist: Dr. Halford Chessman  I have personally reviewed and have noted:  1. The patient's medical and social history 2. Their use of alcohol, tobacco or illicit drugs 3. Their current medications and supplements 4. The patient's functional ability including ADL's, fall risks, home safety risks and hearing or visual impairment. 5. Diet and physical activities 6. Evidence for depression or mood disorder  Subjective:   Review of Systems:   Constitutional: Denies fever, malaise, fatigue, headache or abrupt weight changes.  HEENT: Denies eye pain, eye redness, ear pain, ringing in the ears, wax buildup, runny nose, nasal congestion, bloody nose, or sore throat. Respiratory: Pt reports snoring. Denies difficulty breathing, shortness of breath, cough or sputum production.   Cardiovascular: Denies chest pain, chest tightness, palpitations  or swelling in the hands or feet.  Gastrointestinal: Denies abdominal pain, bloating, constipation, diarrhea or blood in the stool.  GU: Denies urgency, frequency, pain with urination, burning sensation, blood in urine, odor or discharge. Musculoskeletal: Pt reports left shoulder pain. Denies decrease in range of motion, difficulty with gait, muscle pain or joint swelling.  Skin: Denies redness, rashes, lesions or ulcercations.  Neurological: Denies dizziness, difficulty with memory, difficulty with speech or problems with balance and coordination.  Psych: Denies anxiety, depression, SI/HI.  No other specific complaints in a complete review of systems (except as listed in HPI above).  Objective:  PE:   BP 122/80   Pulse 61   Temp 98.1 F (36.7 C) (Oral)   Ht 5' 10.75" (1.797 m)   Wt 209 lb (94.8 kg)   SpO2 98%   BMI 29.36 kg/m   Wt Readings from Last 3 Encounters:  03/20/16 212 lb 3.2 oz (96.3 kg)  12/20/15 206 lb (93.4 kg)  12/06/15 206 lb 9.6 oz (93.7 kg)    General: Appears his stated age, well developed, well nourished in NAD. Skin: Warm, dry and intact.  HEENT: Head: normal shape and size; Eyes: sclera white, no icterus, conjunctiva pink, PERRLA and EOMs intact; Ears: Tm's gray and intact, normal light reflex; Throat/Mouth: Teeth present, mucosa pink and moist, no exudate, lesions or ulcerations noted.  Neck: Neck supple, trachea midline.  Cardiovascular: Normal rate and rhythm. S1,S2 noted.  No murmur, rubs or gallops noted. No JVD or BLE edema. No carotid bruits noted. Pulmonary/Chest: Normal effort and positive vesicular breath sounds. No respiratory distress. No wheezes, rales or ronchi noted.  Abdomen: Soft and nontender. Normal bowel sounds. No distention or masses noted. Liver, spleen and kidneys non palpable. Musculoskeletal: Slightly decreased external rotation of the left shoulder. No pain with palpation of the shoulder. Strength 5/5 BUE/BLE. Negative drop can test  on the left.  Neurological: Alert and oriented. Cranial nerves II-XII grossly intact. Coordination normal.  Psychiatric: Mood and affect normal. Behavior is normal. Judgment and thought content normal.   BMET    Component Value Date/Time   NA 140 09/12/2015 1013   K 4.4 09/12/2015 1013   CL 105 09/12/2015 1013   CO2 30 09/12/2015 1013   GLUCOSE 97 09/12/2015 1013   BUN 18 09/12/2015 1013   CREATININE 1.48 09/12/2015 1013   CALCIUM 9.5 09/12/2015 1013   GFRNONAA 47 (L) 11/17/2013 1607   GFRAA 54 (L) 11/17/2013 1607    Lipid Panel     Component Value Date/Time   CHOL 157 09/12/2015 1013   TRIG 62.0 09/12/2015 1013   HDL 49.90 09/12/2015 1013   CHOLHDL 3 09/12/2015 1013   VLDL 12.4 09/12/2015 1013   LDLCALC 95 09/12/2015 1013  CBC    Component Value Date/Time   WBC 4.8 09/12/2015 1013   RBC 4.45 09/12/2015 1013   HGB 13.2 09/12/2015 1013   HCT 39.1 09/12/2015 1013   PLT 216.0 09/12/2015 1013   MCV 87.8 09/12/2015 1013   MCH 30.0 11/17/2013 1715   MCHC 33.8 09/12/2015 1013   RDW 14.6 09/12/2015 1013   LYMPHSABS 1.8 10/29/2014 0942   MONOABS 0.4 10/29/2014 0942   EOSABS 0.1 10/29/2014 0942   BASOSABS 0.0 10/29/2014 0942    Hgb A1C No results found for: HGBA1C    Assessment and Plan:   Medicare Annual Wellness Visit:  Diet: He does eat meat. He consumes fruits and veggies daily. He occasionally eats fried foods. He drinks mostly water. Physical activity: He walks 3 miles 4 days a week. Depression/mood screen: Negative Hearing: Intact to whispered voice Visual acuity: Grossly normal, performs annual eye exam  ADLs: Capable Fall risk: None Home safety: Good Cognitive evaluation: Trouble with recall.  Intact to orientation, naming and repetition EOL planning: No adv directives, full code/ I agree  Preventative Medicine: Encouraged him to get a flu shot in the fall. All other immunizations are UTD. Colon screening UTD. Encouraged him to consume a balanced  diet and exercise regimen. Advised him to see an eye doctor and dentist annually. Will check CBC, CMET, Lipid, PSA today.   Next appointment: 1 year, Medicare Wellness Exam   Webb Silversmith, NP

## 2016-11-26 NOTE — Assessment & Plan Note (Signed)
Currently not an issue He has Imitrex to take if needed Will monitor

## 2016-11-26 NOTE — Assessment & Plan Note (Signed)
Ultrasound ordered today

## 2016-11-26 NOTE — Patient Instructions (Signed)

## 2016-11-26 NOTE — Assessment & Plan Note (Signed)
He will continue to follow with Dr. Halford Chessman He would like to have a CPAP instead of the dental device

## 2016-11-26 NOTE — Assessment & Plan Note (Signed)
Continue Revatio as needed 

## 2016-11-26 NOTE — Assessment & Plan Note (Signed)
CMET today 

## 2016-12-01 ENCOUNTER — Ambulatory Visit
Admission: RE | Admit: 2016-12-01 | Discharge: 2016-12-01 | Disposition: A | Discharge status: Home / Self Care | Payer: Medicare HMO | Source: Ambulatory Visit | Attending: Internal Medicine | Admitting: Internal Medicine

## 2016-12-01 DIAGNOSIS — E042 Nontoxic multinodular goiter: Secondary | ICD-10-CM | POA: Insufficient documentation

## 2016-12-09 ENCOUNTER — Ambulatory Visit (INDEPENDENT_AMBULATORY_CARE_PROVIDER_SITE_OTHER): Payer: Medicare HMO | Admitting: Family Medicine

## 2016-12-09 VITALS — BP 126/82 | HR 64 | Temp 98.5°F | Ht 70.75 in | Wt 208.5 lb

## 2016-12-09 DIAGNOSIS — M25512 Pain in left shoulder: Secondary | ICD-10-CM

## 2016-12-09 DIAGNOSIS — M7552 Bursitis of left shoulder: Secondary | ICD-10-CM

## 2016-12-09 DIAGNOSIS — M7582 Other shoulder lesions, left shoulder: Secondary | ICD-10-CM

## 2016-12-09 MED ORDER — METHYLPREDNISOLONE ACETATE 40 MG/ML IJ SUSP
80.0000 mg | Freq: Once | INTRAMUSCULAR | Status: AC
  Administered 2016-12-09: 80 mg via INTRA_ARTICULAR

## 2016-12-09 NOTE — Progress Notes (Signed)
Dr. Frederico Hamman T. Dontavion Noxon, MD, [facility] [place], [address] Phone: [phone] Fax: [phone]  12/09/2016  Patient: [name], MRN: [id number], DOB: 12/30/1945, 71 y.o.  Primary Physician:  Jearld Fenton, NP   Chief Complaint  Patient presents with  . Shoulder Pain    Left-Injection   Subjective:   [name] is a 71 y.o. very pleasant male patient who presents with the following:  I remember him well from 2016 where he had a RTC tear but did well with conservative care and PT. Has been fine since then.  6/7 this year, picking up a lot of yard work. 20 mulch bags earlier in the - 1st time since a few years ago when he did PT. Now a lot of pain with abduction, no weakness.   Movement is OK.  Lawnmower bothering it some.   subac inj L  Past Medical History, Surgical History, Social History, Family History, Problem List, Medications, and Allergies have been reviewed and updated if relevant.  Patient Active Problem List   Diagnosis Date Noted  . Multiple thyroid nodules 10/29/2014  . OSA (obstructive sleep apnea) 09/16/2013  . Renal insufficiency 07/04/2013  . Benign essential HTN 06/15/2013  . ED (erectile dysfunction) 04/28/2013  . Migraines 04/28/2013    Past Medical History:  Diagnosis Date  . Chicken pox   . ED (erectile dysfunction)   . HTN (hypertension)    a. 11/2010 Echo: EF 55-65%.  . Obstructive sleep apnea     Past Surgical History:  Procedure Laterality Date  . KNEE SURGERY Left    1970  . SHOULDER SURGERY Left    hx dislocatino with temp  pin age 29 years  from [place] History  . Marital status: Married    Spouse name: N/A  . Number of children: N/A  . Years of education: N/A   Occupational History  . retired     Adult nurse   Social History Main Topics  . Smoking status: Former Smoker    Packs/day: 0.50    Years: 10.00    Types:  Cigarettes    Quit date: 07/12/1976  . Smokeless tobacco: Never Used     Comment: Quit 1978  . Alcohol use No  . Drug use: No  . Sexual activity: Yes   Other Topics Concern  . 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 many years in [place]    Family History  Problem Relation Age of Onset  . Arthritis Mother   . Cancer Mother   . Hypertension Mother   . Colon cancer Brother     No Known Allergies  Medication list reviewed and updated in full in [place].  GEN: No fevers, chills. Nontoxic. Primarily MSK c/o today. MSK: Detailed in the HPI GI: tolerating PO intake without difficulty Neuro: No numbness, parasthesias, or tingling associated. Otherwise the pertinent positives of the ROS are noted above.   Objective:   BP 126/82   Pulse 64   Temp 98.5 F (36.9 C) (Oral)   Ht 5' 10.75" (1.797 m)   Wt 208 lb 8 oz (94.6 kg)   BMI 29.29 kg/m    GEN: Well-developed,well-nourished,in no acute distress; alert,appropriate and cooperative throughout examination HEENT: Normocephalic and atraumatic without obvious abnormalities. Ears, externally no  deformities PULM: Breathing comfortably in no respiratory distress EXT: No clubbing, cyanosis, or edema PSYCH: Normally interactive. Cooperative during the interview. Pleasant. Friendly and conversant. Not anxious or depressed appearing. Normal, full affect.  Shoulder: L Inspection: No muscle wasting or winging Ecchymosis/edema: neg  AC joint, scapula, clavicle: NT Cervical spine: NT, full ROM Spurling's: neg Abduction: full, 5/5 Flexion: full, 5/5 IR, full, lift-off: 5/5 ER at neutral: full, 5/5 AC crossover: neg Neer: pos Hawkins: mild pos Drop Test: neg Empty Can: neg Supraspinatus insertion: mild-mod T Bicipital groove: NT Speed's: neg Yergason's: neg Sulcus sign: neg Scapular dyskinesis: none C5-T1 intact  Neuro:  Sensation intact Grip 5/5   Radiology:  Assessment and Plan:   Rotator cuff tendonitis, left  Left shoulder pain, unspecified chronicity - Plan: methylPREDNISolone acetate (DEPO-MEDROL) injection 80 mg  Subacromial bursitis of left shoulder joint  Likely induced from increased yard work / [employer] Resume home PT that he remembers well.  SubAC Injection, L Verbal consent was obtained from the patient. Risks (including rare infection), benefits, and alternatives were explained. Patient prepped with Chloraprep and Ethyl Chloride used for anesthesia. The subacromial space was injected using the posterior approach. The patient tolerated the procedure well and had decreased pain post injection. No complications. Injection: 8 cc of Lidocaine 1% and 2 mL of Depo-Medrol 40 mg. Needle: 22 gauge   Follow-up: prn  Meds ordered this encounter  Medications  . methylPREDNISolone acetate (DEPO-MEDROL) injection 80 mg   Signed,  Marnie Fazzino T. Javier Gell, MD   Patient's Medications  New Prescriptions   No medications on file  Previous Medications   ACETAMINOPHEN (TYLENOL) 500 MG TABLET    Take 500 mg by mouth every 6 (six) hours as needed for moderate pain.   AMLODIPINE (NORVASC) 5 MG TABLET    TAKE 1 TABLET (5 MG TOTAL) BY MOUTH DAILY.   ASPIRIN 81 MG TABLET    Take 81 mg by mouth daily.   CHOLECALCIFEROL (VITAMIN D) 1000 UNITS TABLET    Take 1,000 Units by mouth daily.   SILDENAFIL (REVATIO) 20 MG TABLET    Take 2-5 tablets as needed for sexual intercourse   SUMATRIPTAN (IMITREX) 100 MG TABLET    Take 1 tablet (100 mg total) by mouth every 2 (two) hours as needed for migraine. May repeat in 2 hours if headache persists or recurs.  Modified Medications   No medications on file  Discontinued Medications   No medications on file

## 2016-12-13 ENCOUNTER — Other Ambulatory Visit: Payer: Self-pay | Admitting: Internal Medicine

## 2016-12-15 ENCOUNTER — Telehealth: Payer: Self-pay

## 2016-12-15 ENCOUNTER — Ambulatory Visit (INDEPENDENT_AMBULATORY_CARE_PROVIDER_SITE_OTHER): Payer: Medicare HMO | Admitting: Internal Medicine

## 2016-12-15 ENCOUNTER — Encounter: Payer: Self-pay | Admitting: Internal Medicine

## 2016-12-15 DIAGNOSIS — I10 Essential (primary) hypertension: Secondary | ICD-10-CM

## 2016-12-15 NOTE — Patient Instructions (Signed)

## 2016-12-15 NOTE — Assessment & Plan Note (Signed)
Controlled on Amlodipine Swelling improved  Will monitor

## 2016-12-15 NOTE — Progress Notes (Signed)
Subjective:    Patient ID: [name], male    DOB: 1945-07-26, 71 y.o.   MRN: [id number]  HPI  Pt presents to the clinic today for a BP check. He reports he has been checking his BP at home. It was 156/100 on Saturday and 174/111 today. He is currently taking Amlodipine 5 mg daily. He has noticed some swelling in his right foot but he denies visual changes, dizziness, chest pain or shortness of breath. His BP today is 126/90.  Review of Systems   Past Medical History:  Diagnosis Date  . Chicken pox   . ED (erectile dysfunction)   . HTN (hypertension)    a. 11/2010 Echo: EF 55-65%.  . Obstructive sleep apnea     Current Outpatient Prescriptions  Medication Sig Dispense Refill  . acetaminophen (TYLENOL) 500 MG tablet Take 500 mg by mouth every 6 (six) hours as needed for moderate pain.    Marland Kitchen amLODipine (NORVASC) 5 MG tablet TAKE 1 TABLET (5 MG TOTAL) BY MOUTH DAILY. 30 tablet 1  . aspirin 81 MG tablet Take 81 mg by mouth daily.    . cholecalciferol (VITAMIN D) 1000 UNITS tablet Take 1,000 Units by mouth daily.    . sildenafil (REVATIO) 20 MG tablet Take 2-5 tablets as needed for sexual intercourse 50 tablet 0  . SUMAtriptan (IMITREX) 100 MG tablet Take 1 tablet (100 mg total) by mouth every 2 (two) hours as needed for migraine. May repeat in 2 hours if headache persists or recurs. 10 tablet 5   Current Facility-Administered Medications  Medication Dose Route Frequency Provider Last Rate Last Dose  . 0.9 %  sodium chloride infusion  500 mL Intravenous Continuous Danis, Estill Cotta III, MD        No Known Allergies  Family History  Problem Relation Age of Onset  . Arthritis Mother   . Cancer Mother   . Hypertension Mother   . Colon cancer Brother     Social History   Social History  . Marital status: Married    Spouse name: N/A  . Number of children: N/A  . Years of education: N/A   Occupational History  . retired     Adult nurse   Social History Main Topics  .  Smoking status: Former Smoker    Packs/day: 0.50    Years: 10.00    Types: Cigarettes    Quit date: 07/12/1976  . Smokeless tobacco: Never Used     Comment: Quit 1978  . Alcohol use No  . Drug use: No  . Sexual activity: Yes   Other Topics Concern  . 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  Respiratory: Denies difficulty breathing, shortness of breath, cough or sputum production.   Cardiovascular: Pt reports right foot swelling. Denies chest pain, chest tightness, palpitations or swelling in the hands.  Neurological: Denies dizziness, difficulty with memory, difficulty with speech or problems with balance and coordination.    No other specific complaints in a complete review of systems (except as listed in HPI above).   Objective:   Physical Exam   BP 126/90   Pulse (!) 58   Temp 97.8 F (36.6 C) (Oral)   Wt 207 lb 12 oz (94.2 kg)   SpO2 97%  BMI 29.18 kg/m  Wt Readings from Last 3 Encounters:  12/15/16 207 lb 12 oz (94.2 kg)  12/09/16 208 lb 8 oz (94.6 kg)  11/26/16 209 lb (94.8 kg)    General: Appears his stated age, well developed, well nourished in NAD. Cardiovascular: Normal rate and rhythm. S1,S2 noted.  No murmur, rubs or gallops noted. No JVD or BLE edema. No carotid bruits noted. Neurological: Alert and oriented.   BMET    Component Value Date/Time   NA 139 11/26/2016 1505   K 4.1 11/26/2016 1505   CL 105 11/26/2016 1505   CO2 29 11/26/2016 1505   GLUCOSE 91 11/26/2016 1505   BUN 15 11/26/2016 1505   CREATININE 1.35 11/26/2016 1505   CALCIUM 9.9 11/26/2016 1505   GFRNONAA 47 (L) 11/17/2013 1607   GFRAA 54 (L) 11/17/2013 1607    Lipid Panel     Component Value Date/Time   CHOL 149 11/26/2016 1505   TRIG 143.0 11/26/2016 1505   HDL 48.20  11/26/2016 1505   CHOLHDL 3 11/26/2016 1505   VLDL 28.6 11/26/2016 1505   LDLCALC 72 11/26/2016 1505    CBC    Component Value Date/Time   WBC 5.3 11/26/2016 1505   RBC 4.30 11/26/2016 1505   HGB 13.1 11/26/2016 1505   HCT 38.4 (L) 11/26/2016 1505   PLT 207.0 11/26/2016 1505   MCV 89.2 11/26/2016 1505   MCH 30.0 11/17/2013 1715   MCHC 34.0 11/26/2016 1505   RDW 14.7 11/26/2016 1505   LYMPHSABS 1.8 10/29/2014 0942   MONOABS 0.4 10/29/2014 0942   EOSABS 0.1 10/29/2014 0942   BASOSABS 0.0 10/29/2014 0942    Hgb A1C No results found for: HGBA1C         Assessment & Plan:

## 2016-12-15 NOTE — Telephone Encounter (Signed)
Patient seen today by Avie Echevaria, NP in office for evaluation of symptoms.       Patient Name: [name] Gender: Male DOB: [birth year] Age: 71 Y 2 M 6 D Return Phone Number: [phone] (Primary), [phone] (Secondary) City/State/Zip: [place] [address] Client [facility] - Client Client Site [place] Physician Webb Silversmith - NP Who Is Calling Patient / Member / Family / Caregiver Call Type Triage / Clinical Relationship To Patient Self Return Phone Number [phone] (Primary) Chief Complaint Leg Swelling And Edema Reason for Call Symptomatic / Request for Health Information Initial Comment [name] , 11/07/1945 feet swelling and BP going up; 155/100; was in Wed for steroid shot; was 126/80 Nurse Assessment Nurse: Arthor Captain, RN, Margaret Date/Time (Eastern Time): 12/13/2016 7:11:45 AM Confirm and document reason for call. If symptomatic, describe symptoms. ---Caller states that his feet are swollen and his BP is 156/100 on Wednesday was 126/80. Denies headache. Does the PT have any chronic conditions? (i.e. diabetes, asthma, etc.) ---Yes List chronic conditions. ---hypertension Guidelines Guideline Title Affirmed Question Leg Swelling and Edema [1] MODERATE leg swelling (e.g., swelling extends up to knees) AND [2] new onset or worsening High Blood Pressure Systolic BP >= 235 OR Diastolic >= 573 Disp. Time Eilene Ghazi Time) Disposition Final User 12/13/2016 7:20:31 AM See PCP When Office is Open (within 3 days) Yes Cockrum, RN, [facility] - ED REFERRED TO PCP OFFICE Care Advice Given Per Guideline SEE PHYSICIAN WITHIN 24 HOURS: * IF OFFICE WILL BE CLOSED AND NO PCP TRIAGE: You need to be seen within the next 24 hours. An urgent care center is often a good source of care if your doctor's office is closed. LEG SWELLING AND/OR EDEMA: * Elevate your legs or try to  lay down once or twice daily for 20 minutes. * Avoid socks with an elastic band at the top. Wear comfortable shoes. CALL BACK IF: * Breathing difficulty or chest pain occurs * You become worse. CARE ADVICE given per Leg Swelling and Edema (Adult) guideline. SEE PCP WITHIN 3 DAYS: * You need to be seen within 2 or 3 days. Call your doctor during regular office hours and make an appointment. An urgent care center is often the best source of care if your doctor's office is closed or you can't get an appointment. NOTE: If office will be open tomorrow, tell caller to call then, not in 3 days. HIGH BLOOD PRESSURE: * Untreated high blood pressure may cause damage to your heart, brain, kidneys, and eyes. * Treatment of high blood pressure can reduce the risk of stroke, heart attack, and heart failure. * The goal of blood pressure treatment for most people with hypertension is to keep the blood PLEASE NOTE: All timestamps contained within this report are represented as [place] Standard Time. CONFIDENTIALTY NOTICE: This fax transmission is intended only for the addressee. It contains information that is legally privileged, confidential or otherwise protected from use or disclosure. If you are not the intended recipient, you are strictly prohibited from reviewing, disclosing, copying using or disseminating any of this information or taking any action in reliance on or regarding this information. If you have received this fax in error, please notify us immediately by telephone so that we can arrange for its return to [place]. Phone: [phone], Toll-Free: [phone], Fax: [phone] Page: 2 of 2 Call Id: [id number] Care Advice Given Per Guideline pressure under 140/90. For people that  are 60 years or older, your doctor may instead want to keep the blood pressure under 150/90. LIFESTYLE MODIFICATIONS - The following things can help you reduce your blood pressure: * EAT HEALTHY: Eat a diet rich in fresh fruits and  vegetables, dietary fiber, non-animal protein (e.g., soy), and low-fat dairy products. Avoid foods with a high content of saturated fat or cholesterol. * DECREASE SODIUM INTAKE: Aim to eat less than 2.4 g (100 mmol) of sodium each day. Unfortunately 75% of the salt in the average person's diet is in pre-processed foods. * LIMIT ALCOHOL: Limit alcohol to 0-2 standard drinks each day. Men should have less than 14 drinks per week. Women should have less than 9 drinks per week. A drink is 1.5 oz hard liquor (one shot or jigger; 45 ml), 5 oz wine (small glass; 150 ml), 12 oz beer (one can; 360 ml). * EXERCISE, BE MORE PHYSICALLY ACTIVE: Do at least 30 minutes of aerobic exercise (e.g., brisk walking) most days of the week. Other examples of aerobic activities cycling, jogging, and swimming. * REDUCE WEIGHT AND WAIST LINE: It is important to maintain a normal body weight. The goal should be a BMI (body mass index) under 25 for men and women, a waist circumference under 40 inches (102 cm) in men, and a waist circumference under 35 inches (88 cm) in women. * REDUCE STRESS: Find activities that help reduce your stress. Examples might include meditation, yoga, or even a restful walk in a park. CALL BACK IF: * Weakness or numbness of the face, arm or leg on one side of the body occurs * Difficulty walking, difficulty talking, or severe headache occurs * Chest pain or difficulty breathing occurs * Your blood pressure is over 180/110 * You become worse. CARE ADVICE given per

## 2017-01-22 ENCOUNTER — Encounter: Payer: Self-pay | Admitting: Pulmonary Disease

## 2017-01-22 ENCOUNTER — Ambulatory Visit (INDEPENDENT_AMBULATORY_CARE_PROVIDER_SITE_OTHER): Payer: Medicare HMO | Admitting: Pulmonary Disease

## 2017-01-22 VITALS — BP 124/80 | HR 62 | Ht 71.0 in | Wt 205.8 lb

## 2017-01-22 DIAGNOSIS — Z23 Encounter for immunization: Secondary | ICD-10-CM | POA: Diagnosis not present

## 2017-01-22 DIAGNOSIS — G4733 Obstructive sleep apnea (adult) (pediatric): Secondary | ICD-10-CM | POA: Diagnosis not present

## 2017-01-22 NOTE — Addendum Note (Signed)
Addended by: Parke Poisson E on: 01/22/2017 11:48 AM   Modules accepted: Orders

## 2017-01-22 NOTE — Patient Instructions (Addendum)
Will arrange for CPAP set up  High dose flu shot today  Follow up in 2 months after CPAP set up

## 2017-01-22 NOTE — Progress Notes (Signed)
Current Outpatient Prescriptions on File Prior to Visit  Medication Sig  . acetaminophen (TYLENOL) 500 MG tablet Take 500 mg by mouth every 6 (six) hours as needed for moderate pain.  Marland Kitchen amLODipine (NORVASC) 5 MG tablet TAKE 1 TABLET (5 MG TOTAL) BY MOUTH DAILY.  Marland Kitchen aspirin 81 MG tablet Take 81 mg by mouth daily.  . cholecalciferol (VITAMIN D) 1000 UNITS tablet Take 1,000 Units by mouth daily.  . sildenafil (REVATIO) 20 MG tablet Take 2-5 tablets as needed for sexual intercourse  . SUMAtriptan (IMITREX) 100 MG tablet Take 1 tablet (100 mg total) by mouth every 2 (two) hours as needed for migraine. May repeat in 2 hours if headache persists or recurs.   Current Facility-Administered Medications on File Prior to Visit  Medication  . 0.9 %  sodium chloride infusion    Chief Complaint  Patient presents with  . Follow-up    Pt would like to know if he can get a CPAP machine for OSA. Pt would like flu shot today.    Sleep tests PSG 09/21/13 >> AHI 8.9, SaO2 low 88%. REM effect.   Past medical history Headaches, HTN, CKD stage III, Goiter  Past surgical history, Family history, Social history, Allergies reviewed  Vital signs BP 124/80 (BP Location: Left Arm, Cuff Size: Normal)   Pulse 62   Ht 5\' 11"  (1.803 m)   Wt 205 lb 12.8 oz (93.4 kg)   SpO2 97%   BMI 28.70 kg/m   History of Present Illness: [name] is a 71 y.o. male with obstructive sleep apnea.  He has tried using oral appliance.  He had to get adjustment done because he started snoring again.  This is uncomfortable now, and he is still snoring some.  He is worried about how further adjustments will affect his teeth and jaw joint.  He would like to try a CPAP machine.  Physical Exam:  General - pleasant Eyes - pupils reactive ENT - no sinus tenderness, no oral exudate, no LAN, MP 3, enlarged tongue, high arch palate Cardiac - regular, no murmur Chest - no wheeze, rales Abd - soft, non tender Ext - no  edema Skin - no rashes Neuro - normal strength Psych - normal mood  Assessment/Plan:  Obstructive sleep apnea. - he is having difficulty adjusting to oral appliance - he is still having snoring, sleep disruption, and daytime sleepiness - he would like to change to CPAP - will arrange for auto CPAP set up - explained he might need repeat sleep study prior to insurance covering CPAP >> if he needs sleep study, then could do home sleep test   Patient Instructions  Will arrange for CPAP set up  High dose flu shot today  Follow up in 2 months after CPAP set up   Chesley Mires, MD [facility] Pager:  [phone] 01/22/2017, 11:43 AM

## 2017-01-29 DIAGNOSIS — G4733 Obstructive sleep apnea (adult) (pediatric): Secondary | ICD-10-CM | POA: Diagnosis not present

## 2017-02-15 ENCOUNTER — Telehealth: Payer: Self-pay | Admitting: Pulmonary Disease

## 2017-02-15 DIAGNOSIS — G4733 Obstructive sleep apnea (adult) (pediatric): Secondary | ICD-10-CM

## 2017-02-15 NOTE — Telephone Encounter (Signed)
Spoke with pt, who is requesting pressure change. Pt states it is difficult to exhale while wearing cpap machine.  Per 01/22/17 OV, current settings are 5-15cm. Pt states he would also like to try the respironics amara view full facemask, due to sleeping with his mouth open occ.   VS please advise. Thanks.

## 2017-02-18 NOTE — Telephone Encounter (Signed)
Awaiting VS to respond.

## 2017-02-18 NOTE — Telephone Encounter (Signed)
VS I looked him up on [employer], no data for over a month. Let me know the next steps if I need to do anything.

## 2017-02-18 NOTE — Telephone Encounter (Signed)
Pt calling back has not heard from anyone about the pressure on his CPAP machine or about the order for the full face mask he was requesting.  Last note 11/5-tr

## 2017-02-18 NOTE — Telephone Encounter (Signed)
Called pt to let him know VS has received the message and should be addressed today or tomorrow.

## 2017-02-18 NOTE — Telephone Encounter (Signed)
Okay to change to [employer] full face mask.  Please send order to DME.  Please get copy of his CPAP download.  Will then let him know what pressure setting to change his CPAP to.

## 2017-02-18 NOTE — Telephone Encounter (Signed)
ATC pt and no answer but unable to leave VM on machine to advise message from VS.

## 2017-02-19 NOTE — Telephone Encounter (Signed)
Spoke with patient. He is aware of the pressure change and mask order. He wishes to have this sent to [facility]. Will go ahead and send this for him.   Nothing else needed at time of call.

## 2017-02-19 NOTE — Telephone Encounter (Signed)
Please send order to change CPAP to 7 cm H2O.

## 2017-02-23 DIAGNOSIS — G4733 Obstructive sleep apnea (adult) (pediatric): Secondary | ICD-10-CM | POA: Diagnosis not present

## 2017-03-01 DIAGNOSIS — G4733 Obstructive sleep apnea (adult) (pediatric): Secondary | ICD-10-CM | POA: Diagnosis not present

## 2017-03-16 ENCOUNTER — Telehealth: Payer: Self-pay | Admitting: Pulmonary Disease

## 2017-03-16 NOTE — Telephone Encounter (Signed)
Spoke with pt, he states he keeps getting the wrong head gear and he needs our assistance to help with this. I called [facility] to see if I could help. Kenney Houseman states she will call the supply team and cal pt to let him know what is going on. Nothing further needed from our end.

## 2017-03-31 DIAGNOSIS — G4733 Obstructive sleep apnea (adult) (pediatric): Secondary | ICD-10-CM | POA: Diagnosis not present

## 2017-05-01 DIAGNOSIS — G4733 Obstructive sleep apnea (adult) (pediatric): Secondary | ICD-10-CM | POA: Diagnosis not present

## 2017-05-15 IMAGING — US US SOFT TISSUE HEAD/NECK
1 series · 6 of 6 positions shown · non-contrast
Comparison: Thyroid ultrasound - 09/17/2015

CLINICAL DATA: Indeterminate left-sided thyroid nodule. Patient
presents today for attempted ultrasound-guided fine-needle
aspiration of dominant approximately 1.9 cm nodule within the
inferior aspect the left lobe of the thyroid.

EXAM:
ULTRASOUND OF HEAD/NECK SOFT TISSUES
TECHNIQUE: Ultrasound examination of the head and neck soft tissues was
performed in the area of clinical concern.

[Series 1: us soft tissue head/neck · 0.06mm/px · 6 of 6 slices shown]
[im 1/6]
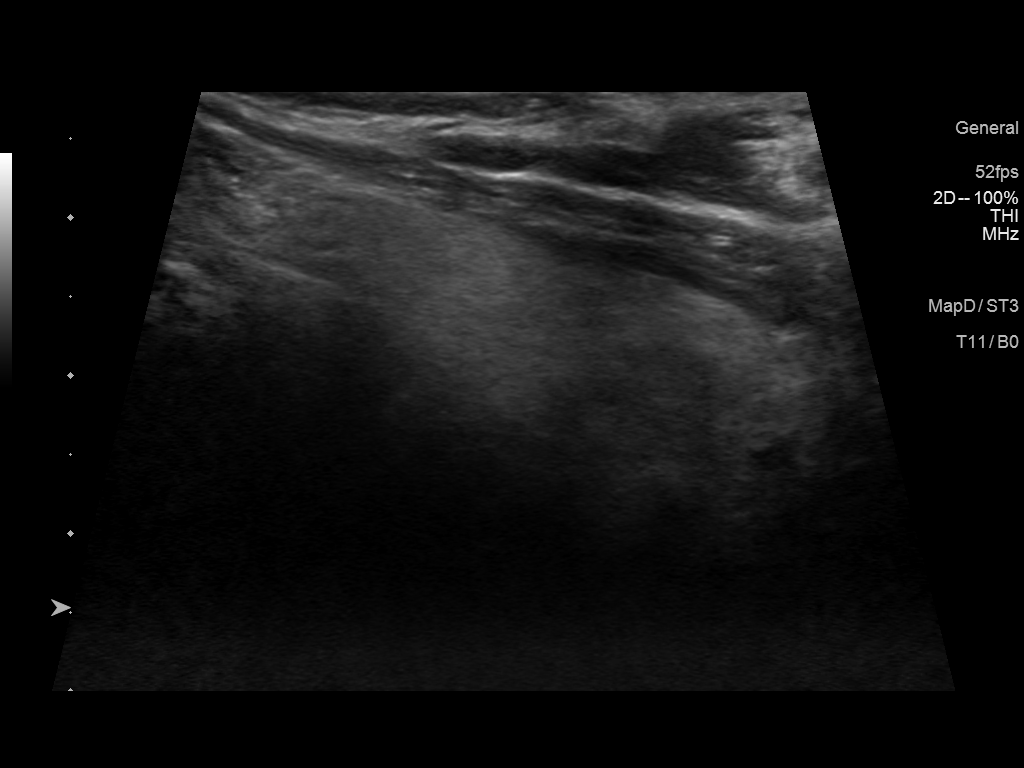
[im 2/6]
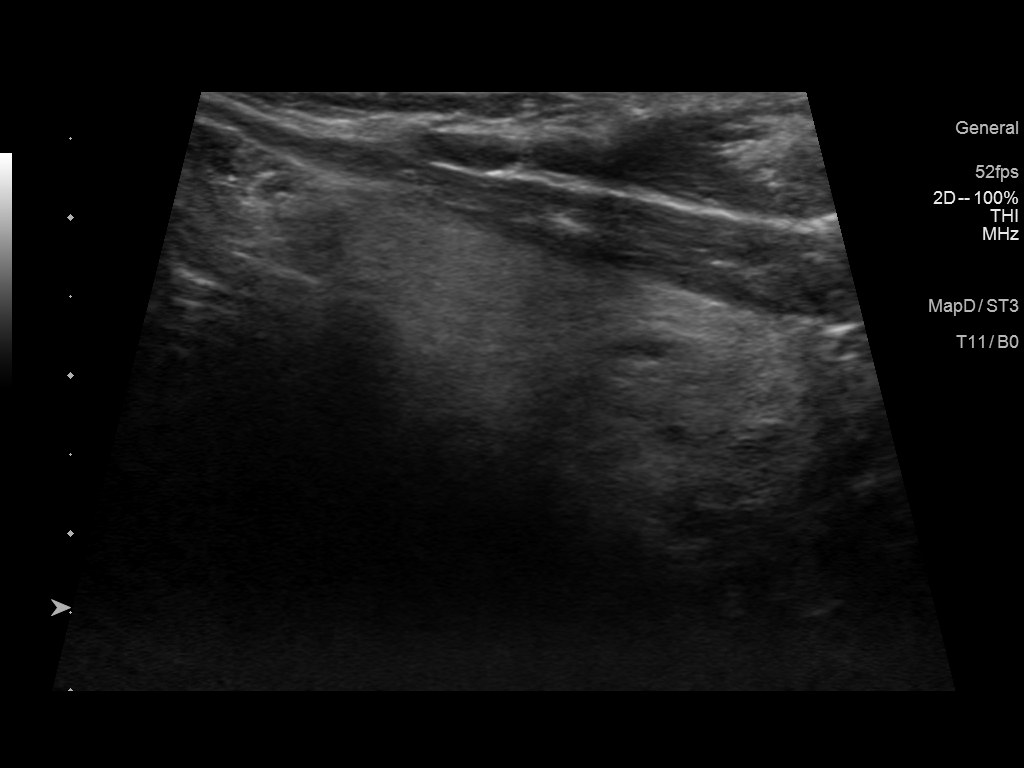
[im 3/6]
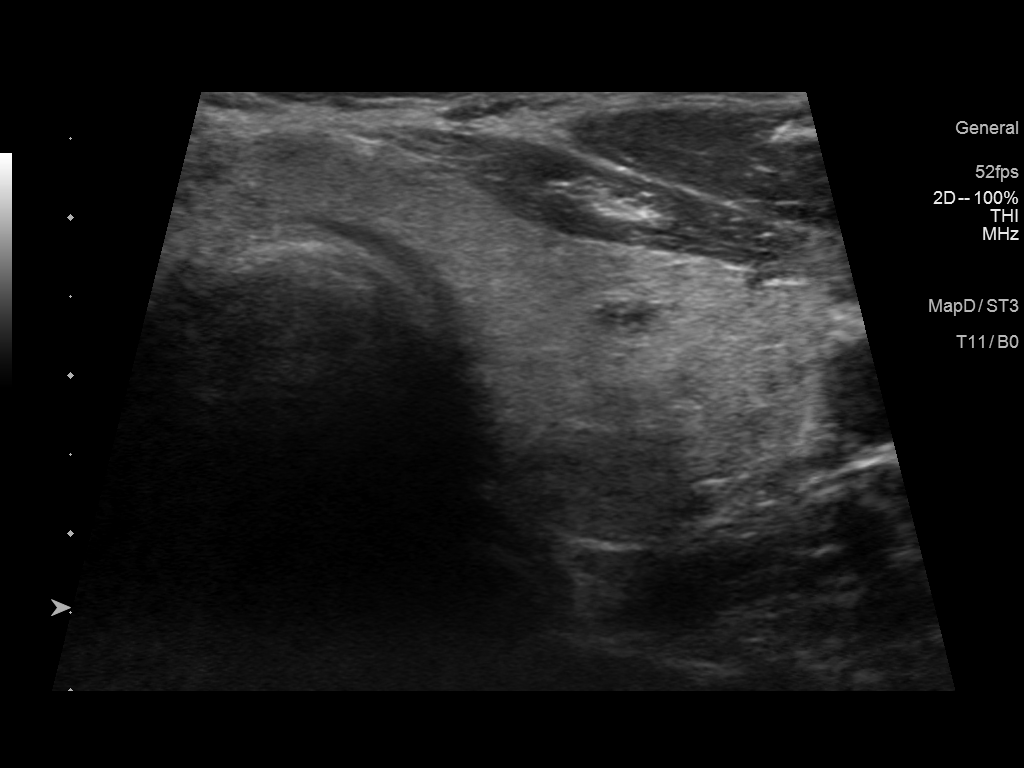
[im 4/6]
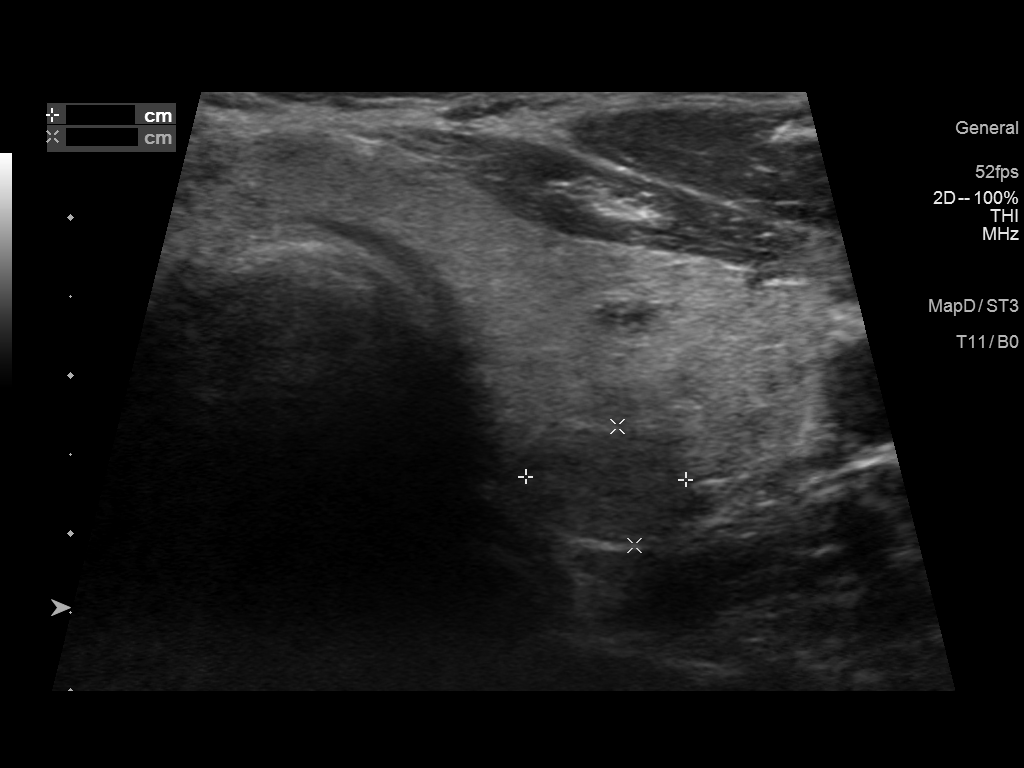
[im 5/6]
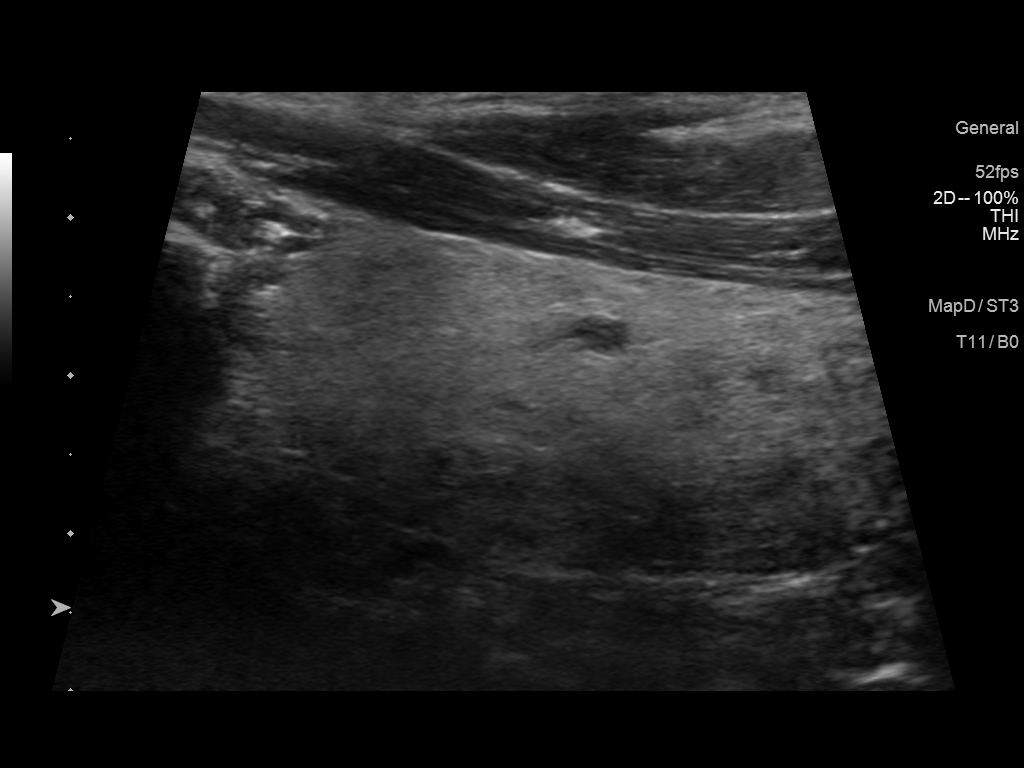
[im 6/6]
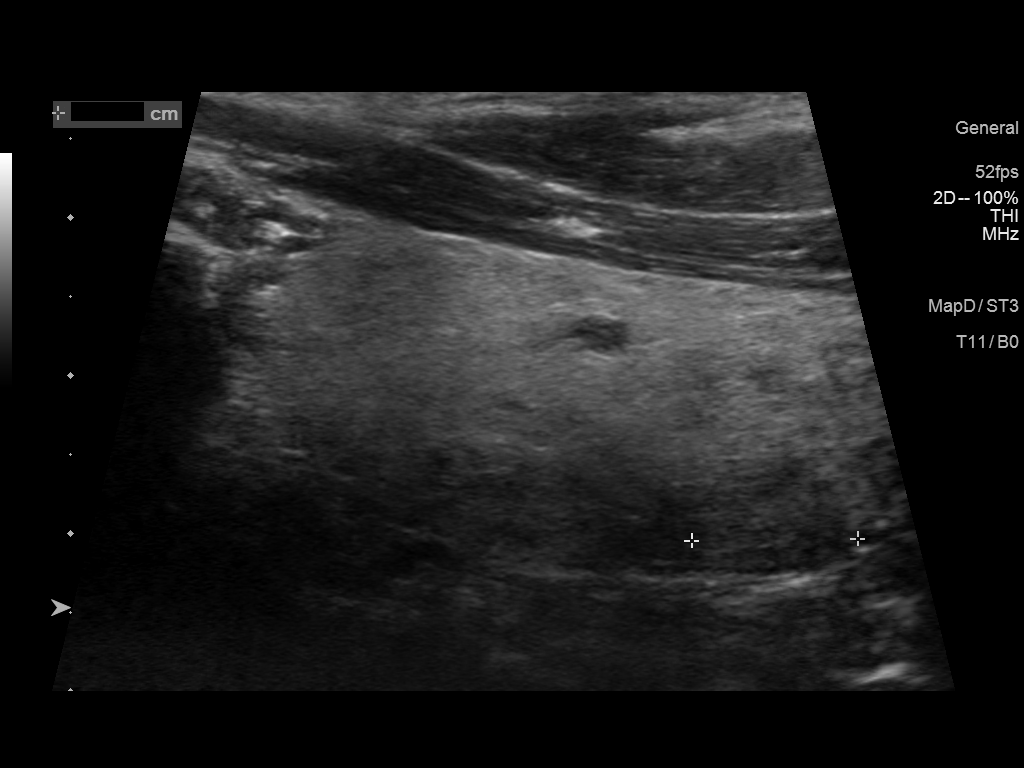

[6 of 6 positions shown; findings below may reference images not displayed]

FINDINGS: Real-time sonographic evaluation by the dictating interventional
radiologist failed to reproduce the dominant mixed echogenic
ill-defined approximately 1.9 x 1.8 x 1.7 cm nodule within the
inferior aspect the left lobe of the thyroid.

Rather, I the previously reported the sonographer or perform the
09/17/2015 examination was measuring either a pseudo nodule versus
several adjacent smaller thyroid nodules.

I am only able to identified approximately 1.0 x 0.8 x 1.1 cm
hypoechoic ill-defined nodule/ pseudo nodule within the posterior
inferior aspect the left lobe of the thyroid (image 4 through 7)
which currently does not meet imaging criteria to recommend
percutaneous sampling.
IMPRESSION: 1. Findings suggestive of multi nodular goiter.
2. The previously described approximately 1.9 cm nodule within the
inferior aspect of the left lobe of the thyroid was not seen on
real-time sonographic evaluation performed by the dictating
interventional radiologist and as such, fine-needle aspiration was
not performed.

## 2017-05-31 ENCOUNTER — Telehealth: Payer: Self-pay | Admitting: Pulmonary Disease

## 2017-05-31 NOTE — Telephone Encounter (Signed)
Spoke with patient. He stated that he looked at his CPAP machine last night and noticed that the setting had been reset to 4cm. Machine is supposed to be on 7cm. Patient denies any other pressure changes since November 2018. Advised patient that I would call [facility] to see if this was a mistake on their part.   Spoke with Magda Paganini at [facility]. She reviewed patient's [employer] account and did not see where any changes had been made.  She advised that she would have a tech call the patient to make sure his machine is working well.   Patient is aware of the follow up call.   Nothing else needed at time of call.

## 2017-06-01 ENCOUNTER — Ambulatory Visit: Payer: Medicare HMO | Admitting: Internal Medicine

## 2017-06-01 DIAGNOSIS — G4733 Obstructive sleep apnea (adult) (pediatric): Secondary | ICD-10-CM | POA: Diagnosis not present

## 2017-06-02 ENCOUNTER — Ambulatory Visit (INDEPENDENT_AMBULATORY_CARE_PROVIDER_SITE_OTHER): Payer: Medicare HMO | Admitting: Internal Medicine

## 2017-06-02 ENCOUNTER — Encounter: Payer: Self-pay | Admitting: Internal Medicine

## 2017-06-02 ENCOUNTER — Ambulatory Visit (INDEPENDENT_AMBULATORY_CARE_PROVIDER_SITE_OTHER)
Admission: RE | Admit: 2017-06-02 | Discharge: 2017-06-02 | Disposition: A | Discharge status: Home / Self Care | Payer: Medicare HMO | Source: Ambulatory Visit | Attending: Internal Medicine | Admitting: Internal Medicine

## 2017-06-02 VITALS — BP 124/82 | HR 81 | Temp 98.1°F | Wt 209.0 lb

## 2017-06-02 DIAGNOSIS — M25551 Pain in right hip: Secondary | ICD-10-CM | POA: Diagnosis not present

## 2017-06-02 NOTE — Progress Notes (Signed)
Subjective:    Patient ID: [name], male    DOB: October 12, 1945, 72 y.o.   MRN: [id number]  HPI  Pt presents to the clinic today with c/o right hip pain. This started 10-12 years ago. It has been intermittent but seems worse in the last 3-4 months. He describes the pain as achy and sharp. The pain does not radiate. It is worse with walking uphill. He denies numbness, tingling or weakness. He denies any injury to the area. He has tried Tylenol, Ibuprofen and hot baths with some relief.   Review of Systems      Past Medical History:  Diagnosis Date  . Chicken pox   . ED (erectile dysfunction)   . HTN (hypertension)    a. 11/2010 Echo: EF 55-65%.  . Obstructive sleep apnea     Current Outpatient Medications  Medication Sig Dispense Refill  . acetaminophen (TYLENOL) 500 MG tablet Take 500 mg by mouth every 6 (six) hours as needed for moderate pain.    Marland Kitchen amLODipine (NORVASC) 5 MG tablet TAKE 1 TABLET (5 MG TOTAL) BY MOUTH DAILY. 30 tablet 10  . aspirin 81 MG tablet Take 81 mg by mouth daily.    . cholecalciferol (VITAMIN D) 1000 UNITS tablet Take 1,000 Units by mouth daily.    . sildenafil (REVATIO) 20 MG tablet Take 2-5 tablets as needed for sexual intercourse 50 tablet 0  . SUMAtriptan (IMITREX) 100 MG tablet Take 1 tablet (100 mg total) by mouth every 2 (two) hours as needed for migraine. May repeat in 2 hours if headache persists or recurs. 10 tablet 5   No current facility-administered medications for this visit.     No Known Allergies  Family History  Problem Relation Age of Onset  . Arthritis Mother   . Cancer Mother   . Hypertension Mother   . Colon cancer Brother     Social History   Socioeconomic History  . Marital status: Married    Spouse name: Not on file  . Number of children: Not on file  . Years of education: Not on file  . Highest education level: Not on file  Social Needs  . Financial resource strain: Not on file  . Food insecurity - worry:  Not on file  . Food insecurity - inability: Not on file  . Transportation needs - medical: Not on file  . Transportation needs - non-medical: Not on file  Occupational History  . Occupation: retired    Comment: clergy  Tobacco Use  . Smoking status: Former Smoker    Packs/day: 0.50    Years: 10.00    Pack years: 5.00    Types: Cigarettes    Last attempt to quit: 07/12/1976    Years since quitting: 40.9  . Smokeless tobacco: Never Used  . Tobacco comment: Quit 1978  Substance and Sexual Activity  . Alcohol use: No    Alcohol/week: 0.0 oz  . Drug use: No  . Sexual activity: Yes  Other Topics Concern  . 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  Musculoskeletal: Pt reports right hip pain. Denies decrease in range of motion, difficulty with gait, muscle pain or joint swelling.    No other specific complaints in a complete review of systems (except as listed in  HPI above).  Objective:   Physical Exam   BP 124/82   Pulse 81   Temp 98.1 F (36.7 C) (Oral)   Wt 209 lb (94.8 kg)   SpO2 98%   BMI 29.15 kg/m  Wt Readings from Last 3 Encounters:  06/02/17 209 lb (94.8 kg)  01/22/17 205 lb 12.8 oz (93.4 kg)  12/15/16 207 lb 12 oz (94.2 kg)    General: Appears his stated age, well developed, well nourished in NAD. Musculoskeletal: Normal flexion, extension, abduction and adduction of the right hip. Pain with internal and external rotation. No pain with palpation over the trochanteric bursa. Strength 5/5 BLE.  BMET    Component Value Date/Time   NA 139 11/26/2016 1505   K 4.1 11/26/2016 1505   CL 105 11/26/2016 1505   CO2 29 11/26/2016 1505   GLUCOSE 91 11/26/2016 1505   BUN 15 11/26/2016 1505   CREATININE 1.35 11/26/2016 1505   CALCIUM 9.9 11/26/2016 1505   GFRNONAA 47 (L)  11/17/2013 1607   GFRAA 54 (L) 11/17/2013 1607    Lipid Panel     Component Value Date/Time   CHOL 149 11/26/2016 1505   TRIG 143.0 11/26/2016 1505   HDL 48.20 11/26/2016 1505   CHOLHDL 3 11/26/2016 1505   VLDL 28.6 11/26/2016 1505   LDLCALC 72 11/26/2016 1505    CBC    Component Value Date/Time   WBC 5.3 11/26/2016 1505   RBC 4.30 11/26/2016 1505   HGB 13.1 11/26/2016 1505   HCT 38.4 (L) 11/26/2016 1505   PLT 207.0 11/26/2016 1505   MCV 89.2 11/26/2016 1505   MCH 30.0 11/17/2013 1715   MCHC 34.0 11/26/2016 1505   RDW 14.7 11/26/2016 1505   LYMPHSABS 1.8 10/29/2014 0942   MONOABS 0.4 10/29/2014 0942   EOSABS 0.1 10/29/2014 0942   BASOSABS 0.0 10/29/2014 0942    Hgb A1C No results found for: HGBA1C         Assessment & Plan:   Right Hip Pain:  Will obtain xray of right hip today Advised him to try Aleve once daily if needed Hip exercises given  Will follow up after xray, return precautions discussed Webb Silversmith, NP

## 2017-06-02 NOTE — Patient Instructions (Signed)

## 2017-06-03 ENCOUNTER — Encounter: Payer: Self-pay | Admitting: Internal Medicine

## 2017-06-04 MED ORDER — NAPROXEN 375 MG PO TABS: 375.0000 mg | ORAL_TABLET | Freq: Two times a day (BID) | ORAL | 2 refills | Status: DC

## 2017-06-21 ENCOUNTER — Other Ambulatory Visit: Payer: Self-pay

## 2017-06-21 ENCOUNTER — Encounter: Payer: Self-pay | Admitting: Family Medicine

## 2017-06-21 ENCOUNTER — Ambulatory Visit (INDEPENDENT_AMBULATORY_CARE_PROVIDER_SITE_OTHER): Payer: Medicare HMO | Admitting: Family Medicine

## 2017-06-21 VITALS — BP 128/80 | HR 57 | Temp 98.7°F | Ht 71.0 in | Wt 214.5 lb

## 2017-06-21 DIAGNOSIS — M7061 Trochanteric bursitis, right hip: Secondary | ICD-10-CM

## 2017-06-21 DIAGNOSIS — M7071 Other bursitis of hip, right hip: Secondary | ICD-10-CM | POA: Diagnosis not present

## 2017-06-21 MED ORDER — METHYLPREDNISOLONE ACETATE 40 MG/ML IJ SUSP
80.0000 mg | Freq: Once | INTRAMUSCULAR | Status: AC
  Administered 2017-06-21: 80 mg via INTRA_ARTICULAR

## 2017-06-21 NOTE — Progress Notes (Signed)
Dr. Frederico Hamman T. Jaice Digioia, MD, [facility] [place], [address] Phone: [phone] Fax: [phone]  06/21/2017  Patient: [name], MRN: [id number], DOB: May 02, 1945, 72 y.o.  Primary Physician:  Jearld Fenton, NP   Chief Complaint  Patient presents with  . Hip Pain    Right   Subjective:   [name] is a 72 y.o. very pleasant male patient who presents with the following:  R hip - points to the lateral side. Nov last year and it has been really bothering. Filling leaf bags.   Has tried some soaking and trying to walk 3 days a week. On concrete. Treadmill does not bother him as much.  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  At this point he points to pain on the lateral aspect of his right hip.  He has had some issues with this off and on, for approximately 10 years, but this is more acutely in the timeframe that he brings up.  Her brother him, more after he started doing some yard work in the fall.  Oral anti-inflammatories have not really seem to help at all.  GTB injection  Past Medical History, Surgical History, Social History, Family History, Problem List, Medications, and Allergies have been reviewed and updated if relevant.  Patient Active Problem List   Diagnosis Date Noted  . Multiple thyroid nodules 10/29/2014  . OSA (obstructive sleep apnea) 09/16/2013  . Renal insufficiency 07/04/2013  . Benign essential HTN 06/15/2013  . ED (erectile dysfunction) 04/28/2013  . Migraines 04/28/2013    Past Medical History:  Diagnosis Date  . Chicken pox   . ED (erectile dysfunction)   . HTN (hypertension)    a. 11/2010 Echo: EF 55-65%.  . Obstructive sleep apnea     Past Surgical History:  Procedure Laterality Date  . KNEE SURGERY Left    1970  . SHOULDER SURGERY Left    hx dislocatino with temp  pin age 19 years  from [place] History   . Marital status: Married    Spouse name: Not on file  . Number of children: Not on file  . Years of education: Not on file  . Highest education level: Not on file  Social Needs  . Financial resource strain: Not on file  . Food insecurity - worry: Not on file  . Food insecurity - inability: Not on file  . Transportation needs - medical: Not on file  . Transportation needs - non-medical: Not on file  Occupational History  . Occupation: retired    Comment: clergy  Tobacco Use  . Smoking status: Former Smoker    Packs/day: 0.50    Years: 10.00    Pack years: 5.00    Types: Cigarettes    Last attempt to quit: 07/12/1976    Years since quitting: 40.9  . Smokeless tobacco: Never Used  . Tobacco comment: Quit 1978  Substance and Sexual Activity  . Alcohol use: No    Alcohol/week: 0.0 oz  . Drug use: No  . Sexual activity: Yes  Other Topics Concern  . 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 many years in [place]    Family History  Problem Relation Age of Onset  . Arthritis Mother   . Cancer Mother   .  Hypertension Mother   . Colon cancer Brother     Allergies  Allergen Reactions  . Naproxen Itching    Medication list reviewed and updated in full in [facility].  GEN: No fevers, chills. Nontoxic. Primarily MSK c/o today. MSK: Detailed in the HPI GI: tolerating PO intake without difficulty Neuro: No numbness, parasthesias, or tingling associated. Otherwise the pertinent positives of the ROS are noted above.   Objective:   BP 128/80   Pulse (!) 57   Temp 98.7 F (37.1 C) (Oral)   Ht 5\' 11"  (1.803 m)   Wt 214 lb 8 oz (97.3 kg)   BMI 29.92 kg/m    GEN: WDWN, NAD, Non-toxic, Alert & Oriented x 3 HEENT: Atraumatic, Normocephalic.  Ears and Nose: No external deformity. EXTR: No clubbing/cyanosis/edema NEURO: Normal gait.  PSYCH: Normally interactive. Conversant. Not  depressed or anxious appearing.  Calm demeanor.   HIP EXAM: SIDE: R ROM: Abduction, Flexion, Internal and External range of motion: full Pain with terminal IROM and EROM: mild with terminal EROM GTB: TTP and posterior to this SLR: NEG Knees: No effusion FABER: NT REVERSE FABER: NT, neg Piriformis: NT at direct palpation Str: flexion: 5/5 abduction: 5/5 adduction: 5/5 Strength testing non-tender  Radiology: Dg Hip Unilat With Pelvis 2-3 Views Right  Result Date: 06/02/2017 CLINICAL DATA:  Chronic right hip pain with recent progression. EXAM: DG HIP (WITH OR WITHOUT PELVIS) 2-3V RIGHT COMPARISON:  None. FINDINGS: There is no evidence of hip fracture or dislocation. There is no evidence of arthropathy or other focal bone abnormality. IMPRESSION: Negative right hip radiographs. Electronically Signed   By: San Morelle M.D.   On: 06/02/2017 15:47    Assessment and Plan:   Trochanteric bursitis, right hip - Plan: methylPREDNISolone acetate (DEPO-MEDROL) injection 80 mg  Bursitis of other bursa of right hip  Repetitive motion injury, trochanteric bursa, as well as the gluteal femoral bursa.  I gave him a injection of corticosteroid, and were also to have him do some basic rehab with stretching and strengthening.  I am also going to have him limit impact to the treadmill only for the next 3-4 weeks, then transition outside.  Trochanteric Bursitis Injection, R Verbal consent obtained. Risks (including infection, potential atrophy), benefits, and alternatives reviewed. Greater trochanter sterilely prepped with Chloraprep. Ethyl Chloride used for anesthesia. 8 cc of Lidocaine 1% injected with 2 mL of Depo-Medrol 40 mg into trochanteric bursa at area of maximal tenderness at greater trochanter. Needle taken to bone to troch bursa, flows easily. Bursa massaged. No bleeding and no complications. Decreased pain after injection. Needle: 22 gauge spinal needle   Follow-up: No Follow-up on  file.  Meds ordered this encounter  Medications  . methylPREDNISolone acetate (DEPO-MEDROL) injection 80 mg   Medications Discontinued During This Encounter  Medication Reason  . naproxen (NAPROSYN) 375 MG tablet Allergic reaction   Signed,  Ephraim Reichel T. Filimon Miranda, MD   Allergies as of 06/21/2017      Reactions   Naproxen Itching      Medication List        Accurate as of 06/21/17 11:59 PM. Always use your most recent med list.          acetaminophen 500 MG tablet Commonly known as:  TYLENOL Take 500 mg by mouth every 6 (six) hours as needed for moderate pain.   amLODipine 5 MG tablet Commonly known as:  NORVASC TAKE 1 TABLET (5 MG TOTAL) BY MOUTH DAILY.   aspirin 81  MG tablet Take 81 mg by mouth daily.   cholecalciferol 1000 units tablet Commonly known as:  VITAMIN D Take 1,000 Units by mouth daily.   sildenafil 20 MG tablet Commonly known as:  REVATIO Take 2-5 tablets as needed for sexual intercourse   SUMAtriptan 100 MG tablet Commonly known as:  IMITREX Take 1 tablet (100 mg total) by mouth every 2 (two) hours as needed for migraine. May repeat in 2 hours if headache persists or recurs.

## 2017-06-29 DIAGNOSIS — G4733 Obstructive sleep apnea (adult) (pediatric): Secondary | ICD-10-CM | POA: Diagnosis not present

## 2017-07-12 ENCOUNTER — Telehealth: Payer: Self-pay | Admitting: Pulmonary Disease

## 2017-07-12 DIAGNOSIS — G4733 Obstructive sleep apnea (adult) (pediatric): Secondary | ICD-10-CM

## 2017-07-12 NOTE — Telephone Encounter (Signed)
Get his CPAP download and I will review.

## 2017-07-12 NOTE — Telephone Encounter (Signed)
Called and spoke with pt who stated a couple weeks ago he developed a headache after using his CPAP machine and also stated he woke up with a headache again this morning. Pt stated after about 15-20 minutes, the headache went away.  Pt also wants to know if his pressure settings might need to be changed due to having some problems from breathing with his CPAP. Pt states he has noticed about the past month it has been a little more difficult to breathe with his machine.  Dr. Halford Chessman, please advise on this for pt. Thanks!

## 2017-07-12 NOTE — Telephone Encounter (Signed)
Download has been placed in VS look at.

## 2017-07-13 NOTE — Telephone Encounter (Signed)
LM with male to call back to give message from VS.

## 2017-07-13 NOTE — Telephone Encounter (Signed)
CPAP 06/12/17 to 07/11/17 >> used on 27 of 30 nights with average 6 hrs 40 min.  Average AHI 0.4 with CPAP 7 cm H2O.   Please let him know that we can try decreasing his CPAP to 6 cm H2O and see if this improves his symptoms.

## 2017-07-14 NOTE — Telephone Encounter (Signed)
Spoke with patient. He is aware of the pressure decrease. Will send in RX to [facility].   Nothing else needed at time of call.

## 2017-07-21 ENCOUNTER — Encounter: Payer: Self-pay | Admitting: Family Medicine

## 2017-07-21 ENCOUNTER — Ambulatory Visit (INDEPENDENT_AMBULATORY_CARE_PROVIDER_SITE_OTHER): Payer: Medicare HMO | Admitting: Family Medicine

## 2017-07-21 ENCOUNTER — Other Ambulatory Visit: Payer: Self-pay

## 2017-07-21 VITALS — BP 140/82 | HR 58 | Temp 98.3°F | Ht 71.0 in | Wt 209.8 lb

## 2017-07-21 DIAGNOSIS — M24851 Other specific joint derangements of right hip, not elsewhere classified: Secondary | ICD-10-CM | POA: Diagnosis not present

## 2017-07-21 MED ORDER — DICLOFENAC SODIUM 75 MG PO TBEC: 75.0000 mg | DELAYED_RELEASE_TABLET | Freq: Two times a day (BID) | ORAL | 2 refills | Status: DC

## 2017-07-21 NOTE — Progress Notes (Signed)
Dr. Frederico Hamman T. Iretha Kirley, MD, [facility] Medicine [place], [address] Phone: [phone] Fax: [phone]  07/21/2017  Patient: [name], MRN: [id number], DOB: 1945/08/17, 72 y.o.  Primary Physician:  Jearld Fenton, NP   Chief Complaint  Patient presents with  . Follow-up    Hip Pain   Subjective:   SAVANNAH Santos is a 72 y.o. very pleasant male patient who presents with the following:  Hip pain, did have some lateral hip pain and presumed. Bothering him and steping into his pants.  M I remember this patient really well.  I saw him last month and at that point I was thinking he primarily had some trochanteric bursitis on the right and I did a combined injection with, tracheal bursitis as well as an injection into the gluteal femoral bursa.  He actually has no pain in that area now.  His primary pain is with certain rotational movements and he has pain in the true hip and anterior pain with rotational movements.  He notices it is getting in and out of a car when putting on his pants.  Notably he does not have any real significant arthritis.  Past Medical History, Surgical History, Social History, Family History, Problem List, Medications, and Allergies have been reviewed and updated if relevant.  Patient Active Problem List   Diagnosis Date Noted  . Multiple thyroid nodules 10/29/2014  . OSA (obstructive sleep apnea) 09/16/2013  . Renal insufficiency 07/04/2013  . Benign essential HTN 06/15/2013  . ED (erectile dysfunction) 04/28/2013  . Migraines 04/28/2013    Past Medical History:  Diagnosis Date  . Chicken pox   . ED (erectile dysfunction)   . HTN (hypertension)    a. 11/2010 Echo: EF 55-65%.  . Obstructive sleep apnea     Past Surgical History:  Procedure Laterality Date  . KNEE SURGERY Left    1970  . SHOULDER SURGERY Left    hx dislocatino with temp  pin age 77 years  from [place] History  . Marital status: Married    Spouse name: Not on file  . Number of children: Not on file  . Years of education: Not on file  . Highest education level: Not on file  Occupational History  . Occupation: retired    Comment: clergy  Social Needs  . Financial resource strain: Not on file  . Food insecurity:    Worry: Not on file    Inability: Not on file  . Transportation needs:    Medical: Not on file    Non-medical: Not on file  Tobacco Use  . Smoking status: Former Smoker    Packs/day: 0.50    Years: 10.00    Pack years: 5.00    Types: Cigarettes    Last attempt to quit: 07/12/1976    Years since quitting: 41.0  . Smokeless tobacco: Never Used  . Tobacco comment: Quit 1978  Substance and Sexual Activity  . Alcohol use: No    Alcohol/week: 0.0 oz  . Drug use: No  . Sexual activity: Yes  Lifestyle  . Physical activity:    Days per week: Not on file    Minutes per session: Not on file  . Stress: Not on file  Relationships  . Social connections:    Talks on phone: Not on file    Gets together: Not on file    Attends  religious service: Not on file    Active member of club or organization: Not on file    Attends meetings of clubs or organizations: Not on file    Relationship status: Not on file  . Intimate partner violence:    Fear of current or ex partner: Not on file    Emotionally abused: Not on file    Physically abused: Not on file    Forced sexual activity: Not on file  Other Topics Concern  . 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 Arthritis Mother   . Cancer Mother   . Hypertension Mother   . Colon cancer Brother     Allergies  Allergen Reactions  . Naproxen Itching    Medication list reviewed and updated in full in [place].  GEN: No fevers,  chills. Nontoxic. Primarily MSK c/o today. MSK: Detailed in the HPI GI: tolerating PO intake without difficulty Neuro: No numbness, parasthesias, or tingling associated. Otherwise the pertinent positives of the ROS are noted above.   Objective:   BP 140/82   Pulse (!) 58   Temp 98.3 F (36.8 C) (Oral)   Ht 5\' 11"  (1.803 m)   Wt 209 lb 12 oz (95.1 kg)   BMI 29.25 kg/m    GEN: WDWN, NAD, Non-toxic, Alert & Oriented x 3 HEENT: Atraumatic, Normocephalic.  Ears and Nose: No external deformity. EXTR: No clubbing/cyanosis/edema NEURO: Normal gait.  PSYCH: Normally interactive. Conversant. Not depressed or anxious appearing.  Calm demeanor.   HIP EXAM: SIDE: R ROM: Abduction, Flexion, Internal and External range of motion: grossly full Pain with terminal IROM and EROM: Some pain with terminal motion FADIR is POSITIVE GTB: NT SLR: NEG Knees: No effusion FABER: NT REVERSE FABER: NT, neg Piriformis: NT at direct palpation Str: flexion: 5/5 abduction: 5/5 adduction: 5/5 Strength testing non-tender     Radiology: Dg Hip Unilat With Pelvis 2-3 Views Right  Result Date: 06/02/2017 CLINICAL DATA:  Chronic right hip pain with recent progression. EXAM: DG HIP (WITH OR WITHOUT PELVIS) 2-3V RIGHT COMPARISON:  None. FINDINGS: There is no evidence of hip fracture or dislocation. There is no evidence of arthropathy or other focal bone abnormality. IMPRESSION: Negative right hip radiographs. Electronically Signed   By: San Morelle M.D.   On: 06/02/2017 15:47   Assessment and Plan:   Other specific joint derangements of right hip, not elsewhere classified  The lateral hip pain has resolved, but it appears he has an intra-articular process going on.  He has no significant arthritis, and his exam is suggestive of a labral tear.  We talked about how to manage this, and he wants to be as conservative as possible.  He is very strong and knowledgeable, so I think continuing his rehab is  very appropriate.  We had to put him on some schedule anti-inflammatories for about a month with follow-up.  If symptoms persist, intra-articular hip injection would be a reasonable step.  MR arthrogram and other surgical contingencies could be considered at a later date.  Follow-up: Return in about 6 weeks (around 09/01/2017).  Meds ordered this encounter  Medications  . diclofenac (VOLTAREN) 75 MG EC tablet    Sig: Take 1 tablet (75 mg total) by mouth 2 (two) times daily.    Dispense:  60 tablet    Refill:  2   Signed,  Aram Domzalski T. Georjean Toya, MD   Allergies as of 07/21/2017      Reactions   Naproxen Itching      Medication List        Accurate as of 07/21/17 11:59 PM. Always use your most recent med list.          acetaminophen 500 MG tablet Commonly known as:  TYLENOL Take 500 mg by mouth every 6 (six) hours as needed for moderate pain.   amLODipine 5 MG tablet Commonly known as:  NORVASC TAKE 1 TABLET (5 MG TOTAL) BY MOUTH DAILY.   aspirin 81 MG tablet Take 81 mg by mouth daily.   cholecalciferol 1000 units tablet Commonly known as:  VITAMIN D Take 1,000 Units by mouth daily.   diclofenac 75 MG EC tablet Commonly known as:  VOLTAREN Take 1 tablet (75 mg total) by mouth 2 (two) times daily.   sildenafil 20 MG tablet Commonly known as:  REVATIO Take 2-5 tablets as needed for sexual intercourse   SUMAtriptan 100 MG tablet Commonly known as:  IMITREX Take 1 tablet (100 mg total) by mouth every 2 (two) hours as needed for migraine. May repeat in 2 hours if headache persists or recurs.

## 2017-07-22 ENCOUNTER — Encounter: Payer: Self-pay | Admitting: Family Medicine

## 2017-07-23 ENCOUNTER — Encounter: Payer: Self-pay | Admitting: Family Medicine

## 2017-07-23 ENCOUNTER — Encounter: Payer: Self-pay | Admitting: Internal Medicine

## 2017-07-30 DIAGNOSIS — G4733 Obstructive sleep apnea (adult) (pediatric): Secondary | ICD-10-CM | POA: Diagnosis not present

## 2017-08-25 ENCOUNTER — Other Ambulatory Visit: Payer: Self-pay | Admitting: Internal Medicine

## 2017-08-25 ENCOUNTER — Encounter: Payer: Self-pay | Admitting: Family Medicine

## 2017-08-29 DIAGNOSIS — G4733 Obstructive sleep apnea (adult) (pediatric): Secondary | ICD-10-CM | POA: Diagnosis not present

## 2017-09-08 DIAGNOSIS — R69 Illness, unspecified: Secondary | ICD-10-CM | POA: Diagnosis not present

## 2017-09-10 ENCOUNTER — Encounter: Payer: Self-pay | Admitting: Family Medicine

## 2017-09-15 ENCOUNTER — Ambulatory Visit (INDEPENDENT_AMBULATORY_CARE_PROVIDER_SITE_OTHER): Payer: Medicare HMO | Admitting: Family Medicine

## 2017-09-15 ENCOUNTER — Encounter: Payer: Self-pay | Admitting: Family Medicine

## 2017-09-15 VITALS — BP 120/84 | HR 57 | Temp 98.3°F | Ht 71.0 in | Wt 212.5 lb

## 2017-09-15 DIAGNOSIS — M24851 Other specific joint derangements of right hip, not elsewhere classified: Secondary | ICD-10-CM | POA: Diagnosis not present

## 2017-09-15 NOTE — Progress Notes (Signed)
Dr. Frederico Hamman T. Arnie Maiolo, MD, [facility] [place], [address] Phone: [phone] Fax: [phone]  09/15/2017  Patient: [name], MRN: [id number], DOB: 1945/08/06, 72 y.o.  Primary Physician:  Jearld Fenton, NP   Chief Complaint  Patient presents with  . Follow-up    Right Hip & Inner Right Thigh   Subjective:   [name] is a 72 y.o. very pleasant male patient who presents with the following:  R hip pain, ongoing. R hip / groin pain.  He is here in follow-up for recheck regarding his ongoing right hip and groin pain.  It is doing better overall, but slowly.  He did have some benefit from taking some ibuprofen.  Thought he might have gotten some tingling from some Voltaren, and when he stopped it it went away.  He still has some pain in the true hip area.  Particular with certain motions.    Past Medical History, Surgical History, Social History, Family History, Problem List, Medications, and Allergies have been reviewed and updated if relevant.  Patient Active Problem List   Diagnosis Date Noted  . Multiple thyroid nodules 10/29/2014  . OSA (obstructive sleep apnea) 09/16/2013  . Renal insufficiency 07/04/2013  . Benign essential HTN 06/15/2013  . ED (erectile dysfunction) 04/28/2013  . Migraines 04/28/2013    Past Medical History:  Diagnosis Date  . Chicken pox   . ED (erectile dysfunction)   . HTN (hypertension)    a. 11/2010 Echo: EF 55-65%.  . Obstructive sleep apnea     Past Surgical History:  Procedure Laterality Date  . KNEE SURGERY Left    1970  . SHOULDER SURGERY Left    hx dislocatino with temp  pin age 45 years  from [place] History  . Marital status: Married    Spouse name: Not on file  . Number of children: Not on file  . Years of education: Not on file  . Highest education level: Not on file  Occupational History  . Occupation:  retired    Comment: clergy  Social Needs  . Financial resource strain: Not on file  . Food insecurity:    Worry: Not on file    Inability: Not on file  . Transportation needs:    Medical: Not on file    Non-medical: Not on file  Tobacco Use  . Smoking status: Former Smoker    Packs/day: 0.50    Years: 10.00    Pack years: 5.00    Types: Cigarettes    Last attempt to quit: 07/12/1976    Years since quitting: 41.2  . Smokeless tobacco: Never Used  . Tobacco comment: Quit 1978  Substance and Sexual Activity  . Alcohol use: No    Alcohol/week: 0.0 oz  . Drug use: No  . Sexual activity: Yes  Lifestyle  . Physical activity:    Days per week: Not on file    Minutes per session: Not on file  . Stress: Not on file  Relationships  . Social connections:    Talks on phone: Not on file    Gets together: Not on file    Attends religious service: Not on file    Active member of club or organization: Not on file    Attends meetings of clubs or organizations: Not on file    Relationship status: Not on file  . Intimate partner violence:  Fear of current or ex partner: Not on file    Emotionally abused: Not on file    Physically abused: Not on file    Forced sexual activity: Not on file  Other Topics Concern  . 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 Arthritis Mother   . Cancer Mother   . Hypertension Mother   . Colon cancer Brother     Allergies  Allergen Reactions  . Naproxen Itching    Medication list reviewed and updated in full in [place].  GEN: No fevers, chills. Nontoxic. Primarily MSK c/o today. MSK: Detailed in the HPI GI: tolerating PO intake without difficulty Neuro: No numbness, parasthesias, or tingling associated. Otherwise the pertinent positives of the ROS are noted above.    Objective:   BP 120/84   Pulse (!) 57   Temp 98.3 F (36.8 C) (Oral)   Ht 5\' 11"  (1.803 m)   Wt 212 lb 8 oz (96.4 kg)   BMI 29.64 kg/m    GEN: WDWN, NAD, Non-toxic, Alert & Oriented x 3 HEENT: Atraumatic, Normocephalic.  Ears and Nose: No external deformity. EXTR: No clubbing/cyanosis/edema NEURO: Normal gait.  PSYCH: Normally interactive. Conversant. Not depressed or anxious appearing.  Calm demeanor.   HIP EXAM: SIDE: R ROM: Abduction, Flexion, Internal and External range of motion: Does have some limitation in abduction and pain with terminal abduction.  Flexion is normal.  Terminal internal rotation is mildly positive, but his FADIR is notably positive. GTB: NT SLR: NEG Knees: No effusion FABER: NT REVERSE FABER: NT, neg Piriformis: NT at direct palpation Str: flexion: 5/5 abduction: 5/5 adduction: 5/5 Strength testing non-tender     Radiology: Dg Hip Unilat With Pelvis 2-3 Views Right  Result Date: 06/02/2017 CLINICAL DATA:  Chronic right hip pain with recent progression. EXAM: DG HIP (WITH OR WITHOUT PELVIS) 2-3V RIGHT COMPARISON:  None. FINDINGS: There is no evidence of hip fracture or dislocation. There is no evidence of arthropathy or other focal bone abnormality. IMPRESSION: Negative right hip radiographs. Electronically Signed   By: San Morelle M.D.   On: 06/02/2017 15:47   Assessment and Plan:   Other specific joint derangements of right hip, not elsewhere classified  History and exam are still most consistent with a labral injury or other internal derangement of the right hip.  We reviewed other potential options including an intra-articular hip injection.  He has been working out and exercising regularly, and his strength is quite good.  Another option would be to get an MR arthrogram of the right hip to evaluate for internal derangement.  After this, consideration of arthroscopy would be reasonable.  After we reviewed all options, patient  would like to continue with conservative care and take some ibuprofen for the next few weeks and see how he feels then.  If he still is having some problems in 1 month, then the plan at this point would be to proceed with a guided intra-articular injection.  Signed,  Maud Deed. Apolonio Cutting, MD   Allergies as of 09/15/2017      Reactions   Naproxen Itching      Medication List        Accurate as of 09/15/17  4:54 PM. Always use your most recent med list.  acetaminophen 500 MG tablet Commonly known as:  TYLENOL Take 500 mg by mouth every 6 (six) hours as needed for moderate pain.   amLODipine 5 MG tablet Commonly known as:  NORVASC TAKE 1 TABLET (5 MG TOTAL) BY MOUTH DAILY.   aspirin 81 MG tablet Take 81 mg by mouth daily.   cholecalciferol 1000 units tablet Commonly known as:  VITAMIN D Take 1,000 Units by mouth daily.   sildenafil 20 MG tablet Commonly known as:  REVATIO Take 2-5 tablets as needed for sexual intercourse   SUMAtriptan 100 MG tablet Commonly known as:  IMITREX Take 1 tablet (100 mg total) by mouth every 2 (two) hours as needed for migraine. May repeat in 2 hours if headache persists or recurs.

## 2017-09-29 DIAGNOSIS — G4733 Obstructive sleep apnea (adult) (pediatric): Secondary | ICD-10-CM | POA: Diagnosis not present

## 2017-10-11 ENCOUNTER — Encounter: Payer: Self-pay | Admitting: Internal Medicine

## 2017-10-13 MED ORDER — SILDENAFIL CITRATE 100 MG PO TABS: 50.0000 mg | ORAL_TABLET | Freq: Every day | ORAL | 5 refills | Status: DC | PRN

## 2017-10-17 ENCOUNTER — Other Ambulatory Visit: Payer: Self-pay | Admitting: Internal Medicine

## 2017-10-29 DIAGNOSIS — G4733 Obstructive sleep apnea (adult) (pediatric): Secondary | ICD-10-CM | POA: Diagnosis not present

## 2017-11-03 ENCOUNTER — Encounter: Payer: Self-pay | Admitting: Family Medicine

## 2017-11-03 ENCOUNTER — Ambulatory Visit (INDEPENDENT_AMBULATORY_CARE_PROVIDER_SITE_OTHER): Payer: Medicare HMO | Admitting: Family Medicine

## 2017-11-03 VITALS — BP 122/84 | HR 58 | Temp 98.5°F | Ht 71.0 in | Wt 206.5 lb

## 2017-11-03 DIAGNOSIS — M7061 Trochanteric bursitis, right hip: Secondary | ICD-10-CM

## 2017-11-03 DIAGNOSIS — M24851 Other specific joint derangements of right hip, not elsewhere classified: Secondary | ICD-10-CM

## 2017-11-03 MED ORDER — METHYLPREDNISOLONE ACETATE 40 MG/ML IJ SUSP
80.0000 mg | Freq: Once | INTRAMUSCULAR | Status: AC
  Administered 2017-11-03: 80 mg via INTRA_ARTICULAR

## 2017-11-03 NOTE — Progress Notes (Signed)
Dr. Frederico Hamman T. Quashawn Jewkes, MD, [facility] [place], [address] Phone: [phone] Fax: [phone]  11/03/2017  Patient: [name], MRN: [id number], DOB: 21-May-1945, 72 y.o.  Primary Physician:  Jearld Fenton, NP   Chief Complaint  Patient presents with  . Follow-up    on right hip   Subjective:   [name] is a 72 y.o. very pleasant male patient who presents with the following:  F/u R hip, question labral injury:   Has inner leg, doing better. Groin and inner hip are doing better.  Having some lateral hip pain, but otherwise strength and rehab is doing really well.  His symptoms around his inner groin have really completely resolved there is almost resolved.  He has been diligent with doing his home rehab, and is quite happy with the results there.  He does have some recurrence of his lateral hip pain around the trochanteric bursa.  R GTB injection  Past Medical History, Surgical History, Social History, Family History, Problem List, Medications, and Allergies have been reviewed and updated if relevant.  Patient Active Problem List   Diagnosis Date Noted  . Multiple thyroid nodules 10/29/2014  . OSA (obstructive sleep apnea) 09/16/2013  . Renal insufficiency 07/04/2013  . Benign essential HTN 06/15/2013  . ED (erectile dysfunction) 04/28/2013  . Migraines 04/28/2013    Past Medical History:  Diagnosis Date  . Chicken pox   . ED (erectile dysfunction)   . HTN (hypertension)    a. 11/2010 Echo: EF 55-65%.  . Obstructive sleep apnea     Past Surgical History:  Procedure Laterality Date  . KNEE SURGERY Left    1970  . SHOULDER SURGERY Left    hx dislocatino with temp  pin age 73 years  from [place] History  . Marital status: Married    Spouse name: Not on file  . Number of children: Not on file  . Years of education: Not on file  . Highest  education level: Not on file  Occupational History  . Occupation: retired    Comment: clergy  Social Needs  . Financial resource strain: Not on file  . Food insecurity:    Worry: Not on file    Inability: Not on file  . Transportation needs:    Medical: Not on file    Non-medical: Not on file  Tobacco Use  . Smoking status: Former Smoker    Packs/day: 0.50    Years: 10.00    Pack years: 5.00    Types: Cigarettes    Last attempt to quit: 07/12/1976    Years since quitting: 41.3  . Smokeless tobacco: Never Used  . Tobacco comment: Quit 1978  Substance and Sexual Activity  . Alcohol use: No    Alcohol/week: 0.0 oz  . Drug use: No  . Sexual activity: Yes  Lifestyle  . Physical activity:    Days per week: Not on file    Minutes per session: Not on file  . Stress: Not on file  Relationships  . Social connections:    Talks on phone: Not on file    Gets together: Not on file    Attends religious service: Not on file    Active member of club or organization: Not on file    Attends meetings of clubs or organizations: Not on file    Relationship status: Not on file  .  Intimate partner violence:    Fear of current or ex partner: Not on file    Emotionally abused: Not on file    Physically abused: Not on file    Forced sexual activity: Not on file  Other Topics Concern  . 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 Arthritis Mother   . Cancer Mother   . Hypertension Mother   . Colon cancer Brother     Allergies  Allergen Reactions  . Naproxen Itching    Medication list reviewed and updated in full in [facility].  GEN: No fevers, chills. Nontoxic. Primarily MSK c/o today. MSK: Detailed in the HPI GI: tolerating PO intake without difficulty Neuro: No numbness, parasthesias, or tingling  associated. Otherwise the pertinent positives of the ROS are noted above.   Objective:   BP 122/84   Pulse (!) 58   Temp 98.5 F (36.9 C) (Oral)   Ht 5\' 11"  (1.803 m)   Wt 206 lb 8 oz (93.7 kg)   BMI 28.80 kg/m    GEN: WDWN, NAD, Non-toxic, Alert & Oriented x 3 HEENT: Atraumatic, Normocephalic.  Ears and Nose: No external deformity. EXTR: No clubbing/cyanosis/edema NEURO: Normal gait.  PSYCH: Normally interactive. Conversant. Not depressed or anxious appearing.  Calm demeanor.   HIP EXAM: SIDE: R ROM: Abduction, Flexion, Internal and External range of motion: full Pain with terminal IROM and EROM: no GTB: ttp SLR: NEG Knees: No effusion FABER: NT REVERSE FABER: NT, neg Piriformis: NT at direct palpation Str: flexion: 5/5 abduction: 5/5 adduction: 5/5 Strength testing non-tender     Radiology: No results found.  Assessment and Plan:   Trochanteric bursitis, right hip  Other specific joint derangements of right hip, not elsewhere classified  He has done very well, clinically I felt that he had a labral tear on the right side.  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  His hip is calm down in the joint space, but he is flared up his trochanteric bursa again recently.  He has increased his activity level recently and is been doing some high intensity interval training.  Trochanteric Bursitis Injection, R Verbal consent obtained. Risks (including infection, potential atrophy), benefits, and alternatives reviewed. Greater trochanter sterilely prepped with Chloraprep. Ethyl Chloride used for anesthesia. 8 cc of Lidocaine 1% injected with 2 mL of Depo-Medrol 40 mg into trochanteric bursa at area of maximal tenderness at greater trochanter. Needle taken to bone to troch bursa, flows easily. Bursa massaged. No bleeding and no complications. Decreased pain after injection. Needle: 22 gauge spinal needle   Follow-up:  No follow-ups on file.  Signed,  Maud Deed. Elvira Langston, MD   Allergies as of 11/03/2017      Reactions   Naproxen Itching      Medication List        Accurate as of 11/03/17  8:54 AM. Always use your most recent med list.          acetaminophen 500 MG tablet Commonly known as:  TYLENOL Take 500 mg by mouth every 6 (six) hours as needed for moderate pain.   amLODipine 5 MG tablet Commonly known as:  NORVASC TAKE 1 TABLET (5 MG TOTAL) BY MOUTH DAILY.   aspirin 81 MG tablet  Take 81 mg by mouth daily.   cholecalciferol 1000 units tablet Commonly known as:  VITAMIN D Take 1,000 Units by mouth daily.   sildenafil 100 MG tablet Commonly known as:  VIAGRA Take 0.5 tablets (50 mg total) by mouth daily as needed for erectile dysfunction.   SUMAtriptan 100 MG tablet Commonly known as:  IMITREX Take 1 tablet (100 mg total) by mouth every 2 (two) hours as needed for migraine. May repeat in 2 hours if headache persists or recurs.

## 2017-11-03 NOTE — Addendum Note (Signed)
Addended by: Emelia Salisbury C on: 11/03/2017 05:34 PM   Modules accepted: Orders

## 2017-11-08 IMAGING — US US THYROID
1 series · 13 of 25 positions shown · non-contrast
Comparison: 09/27/2015 and previous

CLINICAL DATA: Nodules

EXAM:
THYROID ULTRASOUND
TECHNIQUE: Ultrasound examination of the thyroid gland and adjacent soft
tissues was performed.

[Series 1: us thyroid · 0.08mm/px · 13 of 57 slices shown]
[im 1/57]
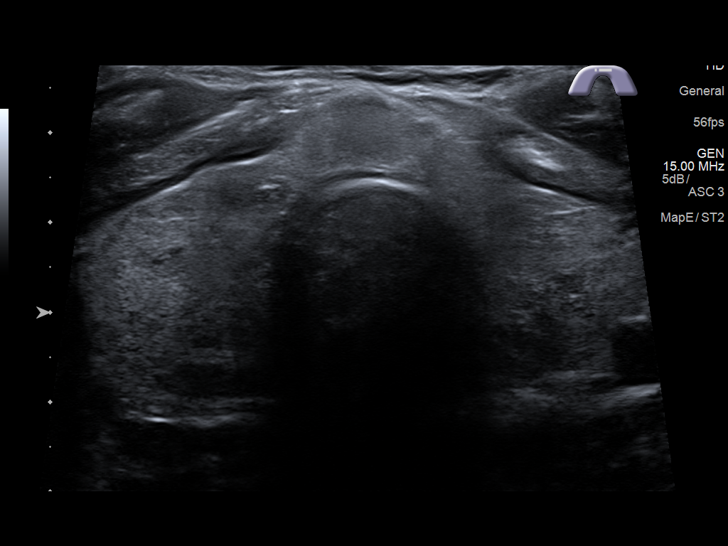
[im 5/57]
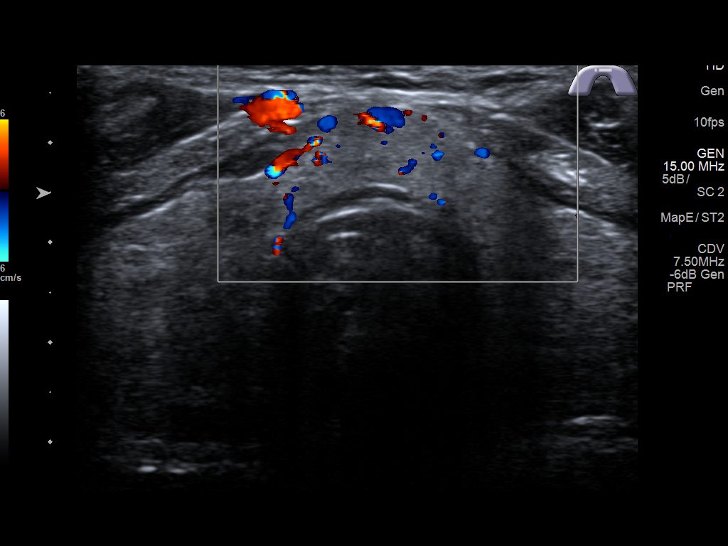
[im 10/57]
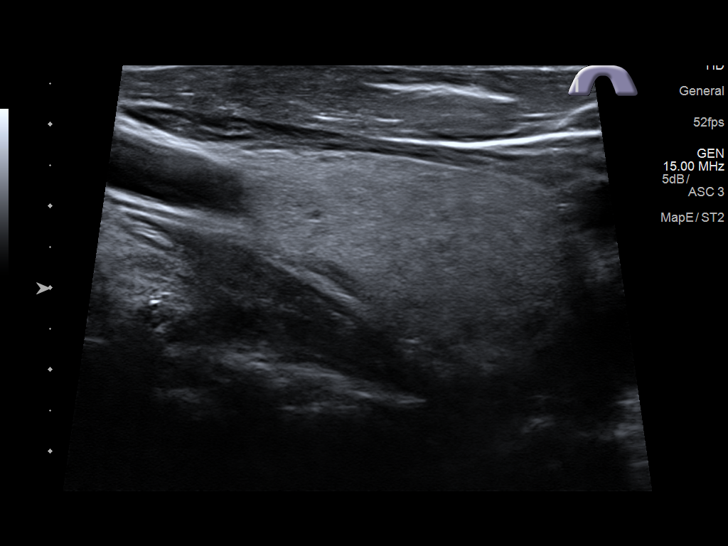
[im 15/57]
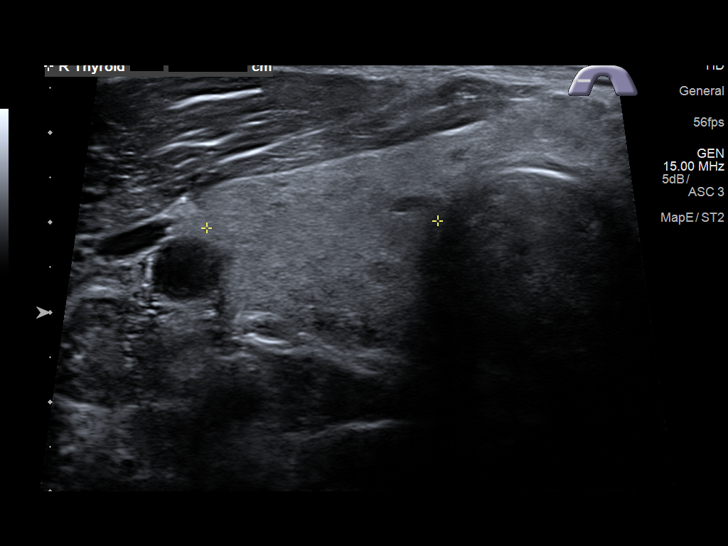
[im 19/57]
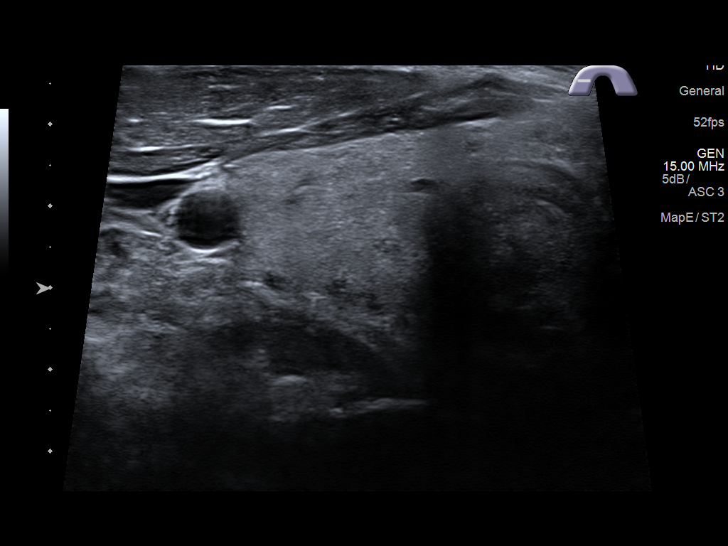
[im 24/57]
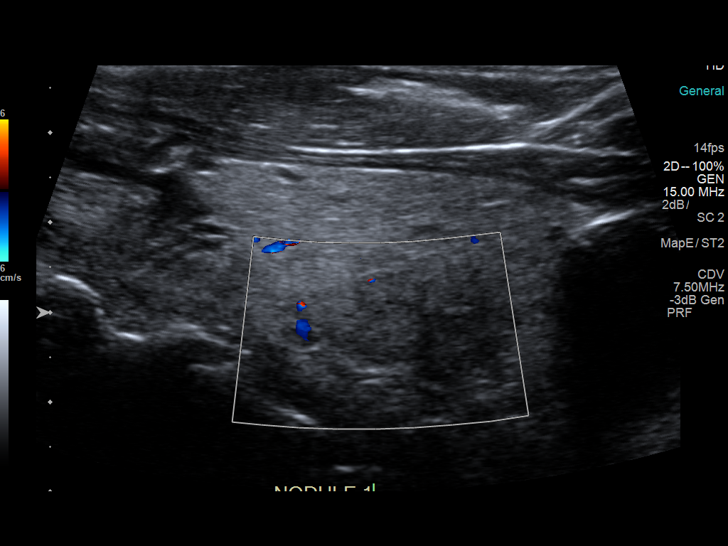
[im 29/57]
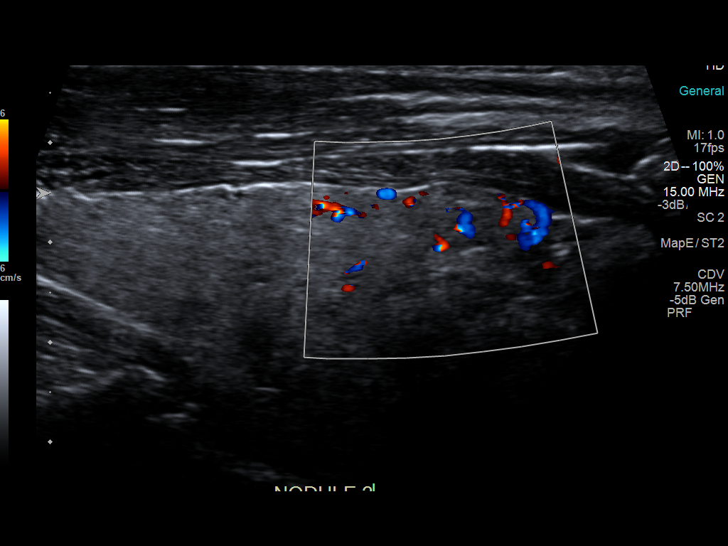
[im 33/57]
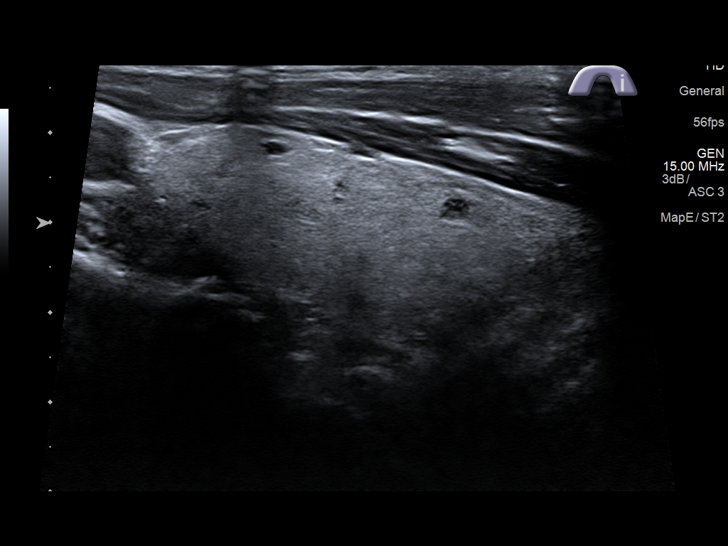
[im 38/57]
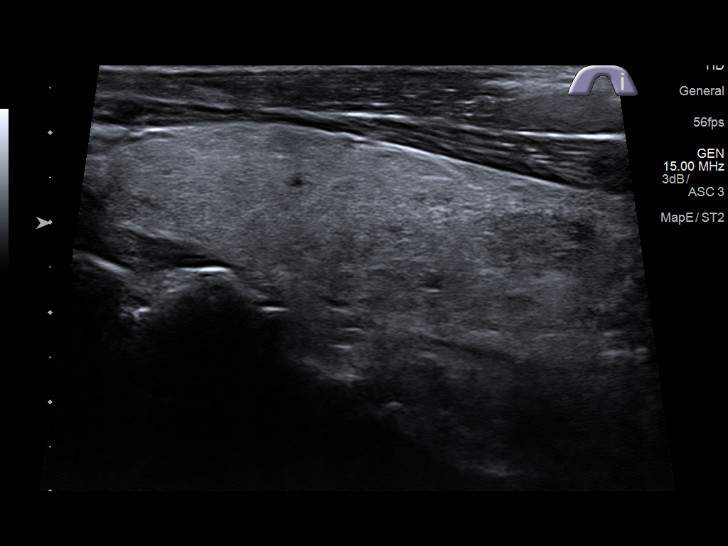
[im 43/57]
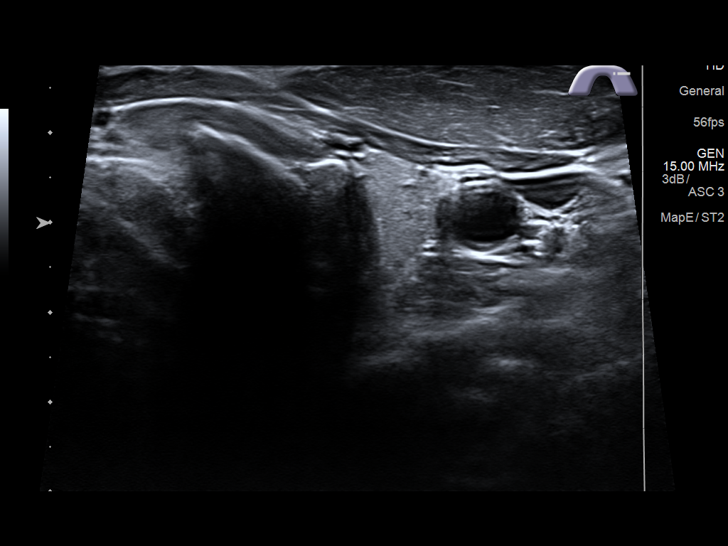
[im 47/57]
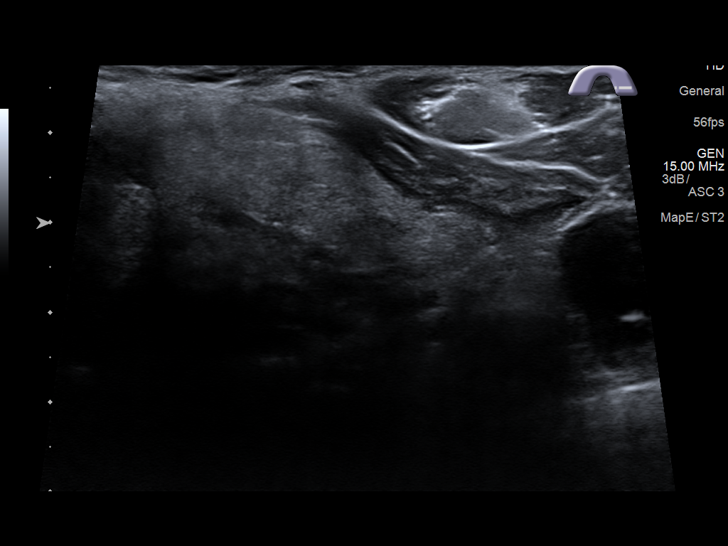
[im 52/57]
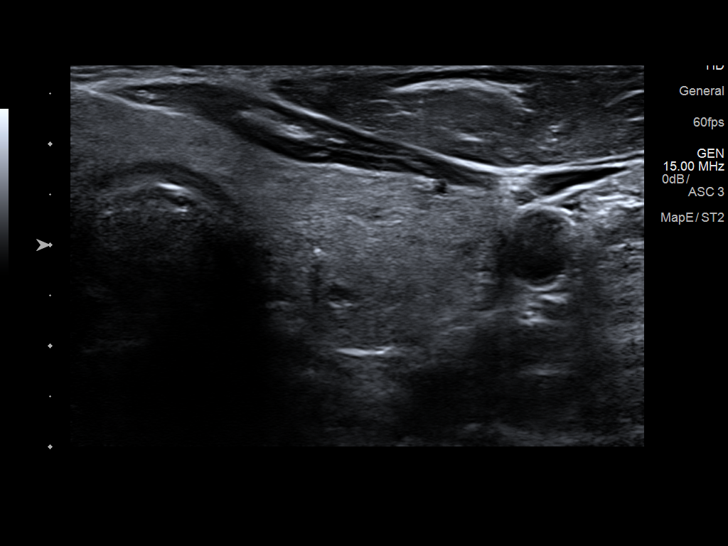
[im 57/57]
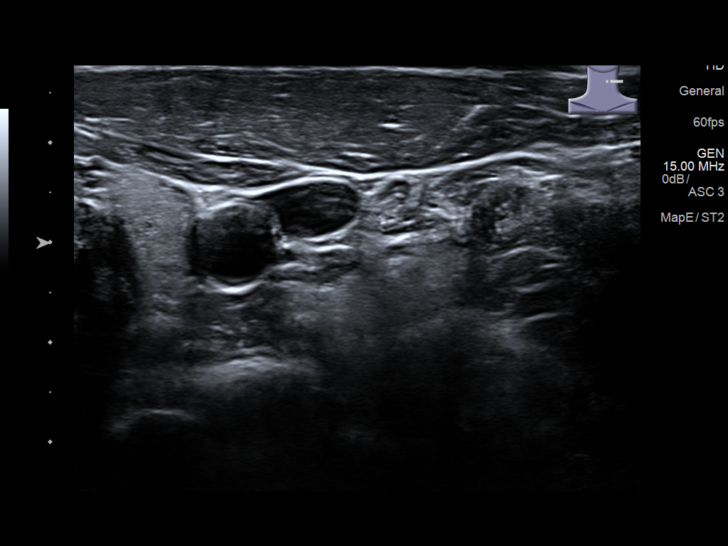

[13 of 25 positions shown; findings below may reference images not displayed]

FINDINGS: Parenchymal Echotexture: Mildly heterogenous

Isthmus: 0.9 cm thickness, previously

Right lobe: 5.3 x 2.6 x 2.4 cm, previously 5.1 x 2.6 x

Left lobe: 5.6 x 2.3 x 2.2 cm, previously 6.1 x 2.5 x

_________________________________________________________

Estimated total number of nodules >/= 1 cm: 2

Number of spongiform nodules >/=  2 cm not described below (TR1): 0

Number of mixed cystic and solid nodules >/= 1.5 cm not described
below (TR2): 0

_________________________________________________________

Nodule # 1:

Prior biopsy: No

Location: Right; Mid

Maximum size: 1.1 cm; Other 2 dimensions: 1.1 x 1 cm, previously,
1.5 x 1.3 x 1.1 cm

Composition: solid/almost completely solid (2)

Echogenicity: hypoechoic (2)

Shape: not taller-than-wide (0)

Margins: ill-defined (0)

Echogenic foci: none (0)

ACR TI-RADS total points: 3.

ACR TI-RADS risk category:  TR4 (4-6 points).

Significant change in size (>/= 20% in two dimensions and minimal
increase of 2 mm): No

Change in features: No

Change in ACR TI-RADS risk category: No

ACR TI-RADS recommendations:

*Given size (>/= 1 - 1.4 cm) and appearance, a follow-up ultrasound
in 1 year should be considered based on TI-RADS criteria.

_________________________________________________________

0.8 x 0.8 x 0.7 cm hypoechoic nodule, right lobe at the isthmus

Nodule # 2:

Prior biopsy: No

Location: Left; Inferior

Maximum size: 1.4 cm; Other 2 dimensions: 1.4 x 1 cm, previously,
1.9 x 1.8 x 1.7 cm

Composition: solid/almost completely solid (2)

Echogenicity: isoechoic (1)

Shape: not taller-than-wide (0)

Margins: ill-defined (0)

Echogenic foci: none (0)

ACR TI-RADS total points: 3.

ACR TI-RADS risk category:  TR3 (3 points).

Significant change in size (>/= 20% in two dimensions and minimal
increase of 2 mm): Yes

Change in features: Yes

Change in ACR TI-RADS risk category: No

ACR TI-RADS recommendations:

Given size (<1.4 cm) and appearance, this nodule does NOT meet
TI-RADS criteria for biopsy or dedicated follow-up.

_________________________________________________________
IMPRESSION: 1. Stable thyromegaly with bilateral nodules. None meets criteria
for FNA biopsy.
2. Recommend 1-2 year follow-up surveillance ultrasound, until
stability x5 years demonstrated.

The above is in keeping with the ACR TI-RADS recommendations - [HOSPITAL] 7711;[DATE].

## 2017-11-29 DIAGNOSIS — G4733 Obstructive sleep apnea (adult) (pediatric): Secondary | ICD-10-CM | POA: Diagnosis not present

## 2017-12-02 DIAGNOSIS — H40003 Preglaucoma, unspecified, bilateral: Secondary | ICD-10-CM | POA: Diagnosis not present

## 2017-12-17 ENCOUNTER — Encounter: Payer: Self-pay | Admitting: Internal Medicine

## 2017-12-19 MED ORDER — SILDENAFIL CITRATE 20 MG PO TABS: ORAL_TABLET | ORAL | 2 refills | Status: DC

## 2017-12-30 DIAGNOSIS — G4733 Obstructive sleep apnea (adult) (pediatric): Secondary | ICD-10-CM | POA: Diagnosis not present

## 2018-01-04 ENCOUNTER — Encounter: Payer: Self-pay | Admitting: Internal Medicine

## 2018-01-06 ENCOUNTER — Ambulatory Visit (INDEPENDENT_AMBULATORY_CARE_PROVIDER_SITE_OTHER): Payer: Medicare HMO

## 2018-01-06 DIAGNOSIS — Z23 Encounter for immunization: Secondary | ICD-10-CM | POA: Diagnosis not present

## 2018-01-11 ENCOUNTER — Other Ambulatory Visit: Payer: Self-pay | Admitting: Internal Medicine

## 2018-01-14 DIAGNOSIS — R69 Illness, unspecified: Secondary | ICD-10-CM | POA: Diagnosis not present

## 2018-04-27 ENCOUNTER — Other Ambulatory Visit: Payer: Self-pay | Admitting: Family Medicine

## 2018-05-02 DIAGNOSIS — R69 Illness, unspecified: Secondary | ICD-10-CM | POA: Diagnosis not present

## 2018-07-01 ENCOUNTER — Other Ambulatory Visit: Payer: Self-pay | Admitting: Internal Medicine

## 2018-09-01 ENCOUNTER — Encounter: Payer: Self-pay | Admitting: Internal Medicine

## 2018-09-20 ENCOUNTER — Telehealth: Payer: Self-pay | Admitting: Pulmonary Disease

## 2018-09-20 NOTE — Telephone Encounter (Signed)
After re-booting my computer I attempted to access air view. Patient cpap compliance report printed and placed in [facility] box for [facility] appt this week. Call made to make pt aware. Nothing further is needed at this time.

## 2018-09-20 NOTE — Telephone Encounter (Signed)
Call returned to patient, he states he needs cpap supplies. Made aware he has not been seen since 2018 so he will need a visit. Inquired as to whether this patient has a SD card, he states he does. Made aware he will need to bring this by the office so that we can get a download. Voiced understanding. Nothing further is needed at this time. He requested to have a mychart visit. Appt made. Nothing further is needed at this time.

## 2018-09-21 NOTE — Progress Notes (Signed)
Virtual Visit via Video Note  I connected with [name] on 09/22/18 at  4:00 PM EDT by a video enabled telemedicine application and verified that I am speaking with the correct person using two identifiers.  Location: Patient: Home Provider: Office - [facility] - [address], [address], [place], [place] [address]  I discussed the limitations of evaluation and management by telemedicine and the availability of in person appointments. The patient expressed understanding and agreed to proceed. I also discussed with the patient that there may be a patient responsible charge related to this service. The patient expressed understanding and agreed to proceed.  Patient consented to consult via telephone: Yes People present and their role in pt care: Pt   History of Present Illness: 73 year old followed in our office for mild obstructive sleep apnea patient was last seen in 2018 Pt: Dr. Halford Chessman  Chief complaint: Follow up for CPAP supplies    73 year old male completing a video visit with our office today due to COVID-19 restrictions.  Patient requested this office visit he was last seen in 2018.  He reports that everything with CPAP is going well except he is interested in changing his mask from a nasal mask to a full facemask.  He is interested in purchasing one from [email or web address].  He cannot remember exactly what the name of this mask is.  When he contacted [facility], they reported that he needed to have a follow-up with our office in order to get an order.  CPAP compliance report shows excellent compliance.  See compliance report listed below  08/21/2018-09/19/2018 20-30 last 30 days use, 28 of those days greater than 4 hours, average usage 5 hours and 53 minutes, CPAP set pressure 6, AHI 0.5  Observations/Objective: Sleep tests PSG 09/21/13 >> AHI 8.9, SaO2 low 88%. REM effect.   Assessment and Plan:  OSA (obstructive sleep apnea) Assessment: Mild obstructive sleep apnea in  2015 sleep study CPAP compliance report today shows excellent compliance and well-controlled AHI of 0.5 Patient is interested in purchasing a fullface mask from [email or web address].  Plan: Continue CPAP as prescribed Contact our office when you know exactly what mask you are interested in receiving from [email or web address], we will likely need to print a prescription and mail this to you so that we you can get this from [email or web address] Contact our office when you do receive your new mask so we can check your compliance in 6 to 8 weeks after you start using it Follow-up with our office in 1 year Continue to work on weight loss and exercise   Follow Up Instructions:  Return in about 1 year (around 09/22/2019), or if symptoms worsen or fail to improve, for Follow up with Wyn Quaker FNP-C, Follow up with Dr. Halford Chessman.    I discussed the assessment and treatment plan with the patient. The patient was provided an opportunity to ask questions and all were answered. The patient agreed with the plan and demonstrated an understanding of the instructions.   The patient was advised to call back or seek an in-person evaluation if the symptoms worsen or if the condition fails to improve as anticipated.  I provided 17 minutes of non-face-to-face time during this encounter.   Lauraine Rinne, NP

## 2018-09-22 ENCOUNTER — Telehealth (INDEPENDENT_AMBULATORY_CARE_PROVIDER_SITE_OTHER): Payer: Medicare HMO | Admitting: Pulmonary Disease

## 2018-09-22 ENCOUNTER — Encounter: Payer: Self-pay | Admitting: Pulmonary Disease

## 2018-09-22 DIAGNOSIS — G4733 Obstructive sleep apnea (adult) (pediatric): Secondary | ICD-10-CM

## 2018-09-22 NOTE — Assessment & Plan Note (Signed)
Assessment: Mild obstructive sleep apnea in 2015 sleep study CPAP compliance report today shows excellent compliance and well-controlled AHI of 0.5 Patient is interested in purchasing a fullface mask from [email or web address].  Plan: Continue CPAP as prescribed Contact our office when you know exactly what mask you are interested in receiving from [email or web address], we will likely need to print a prescription and mail this to you so that we you can get this from [email or web address] Contact our office when you do receive your new mask so we can check your compliance in 6 to 8 weeks after you start using it Follow-up with our office in 1 year Continue to work on weight loss and exercise

## 2018-09-22 NOTE — Patient Instructions (Addendum)
We can refill your supplies as well as mask of choice from your DME company  You can contact our office when you know the exact name and size of the mask from [email or web address]  When you receive your new mask contact our office so we can check your compliance 4 to 6 weeks later to ensure you are still well managed on CPAP therapy  We recommend that you continue using your CPAP daily >>>Keep up the hard work using your device >>> Goal should be wearing this for the entire night that you are sleeping, at least 4 to 6 hours  Remember:  . Do not drive or operate heavy machinery if tired or drowsy.  . Please notify the supply company and office if you are unable to use your device regularly due to missing supplies or machine being broken.  . Work on maintaining a healthy weight and following your recommended nutrition plan  . Maintain proper daily exercise and movement  . Maintaining proper use of your device can also help improve management of other chronic illnesses such as: Blood pressure, blood sugars, and weight management.   BiPAP/ CPAP Cleaning:  >>>Clean weekly, with Dawn soap, and bottle brush.  Set up to air dry.    Return in about 1 year (around 09/22/2019), or if symptoms worsen or fail to improve, for Follow up with Wyn Quaker FNP-C, Follow up with Dr. Halford Chessman.   Coronavirus (COVID-19) Are you at risk?  Are you at risk for the Coronavirus (COVID-19)?  To be considered HIGH RISK for Coronavirus (COVID-19), you have to meet the following criteria:  . Traveled to [place], [place], [place], [place] or [place]; or in the [place] to [place], [place], [place], or [place]; and have fever, cough, and shortness of breath within the last 2 weeks of travel OR . Been in close contact with a person diagnosed with COVID-19 within the last 2 weeks and have fever, cough, and shortness of breath . IF YOU DO NOT MEET THESE CRITERIA, YOU ARE CONSIDERED LOW RISK FOR COVID-19.  What to do if  you are HIGH RISK for COVID-19?  Marland Kitchen If you are having a medical emergency, call 911. . Seek medical care right away. Before you go to a doctor's office, urgent care or emergency department, call ahead and tell them about your recent travel, contact with someone diagnosed with COVID-19, and your symptoms. You should receive instructions from your physician's office regarding next steps of care.  . When you arrive at healthcare provider, tell the healthcare staff immediately you have returned from visiting [place], [place], [place], [place] or [place]; or traveled in the [place] to [place], [place], [place], or [place]; in the last two weeks or you have been in close contact with a person diagnosed with COVID-19 in the last 2 weeks.   . Tell the health care staff about your symptoms: fever, cough and shortness of breath. . After you have been seen by a medical provider, you will be either: o Tested for (COVID-19) and discharged home on quarantine except to seek medical care if symptoms worsen, and asked to  - Stay home and avoid contact with others until you get your results (4-5 days)  - Avoid travel on public transportation if possible (such as bus, train, or airplane) or o Sent to the Emergency Department by EMS for evaluation, COVID-19 testing, and possible admission depending on your condition and test results.  What to do if  you are LOW RISK for COVID-19?  Reduce your risk of any infection by using the same precautions used for avoiding the common cold or flu:  Marland Kitchen Wash your hands often with soap and warm water for at least 20 seconds.  If soap and water are not readily available, use an alcohol-based hand sanitizer with at least 60% alcohol.  . If coughing or sneezing, cover your mouth and nose by coughing or sneezing into the elbow areas of your shirt or coat, into a tissue or into your sleeve (not your hands). . Avoid shaking hands with others and consider head nods or verbal  greetings only. . Avoid touching your eyes, nose, or mouth with unwashed hands.  . Avoid close contact with people who are sick. . Avoid places or events with large numbers of people in one location, like concerts or sporting events. . Carefully consider travel plans you have or are making. . If you are planning any travel outside or inside the [place], visit the CDC's Travelers' Health webpage for the latest health notices. . If you have some symptoms but not all symptoms, continue to monitor at home and seek medical attention if your symptoms worsen. . If you are having a medical emergency, call 911.   [place] / e-Visit: [email or web address]         [facility]: [facility]: [phone]                   [facility]: [phone]           It is flu season:   >>> Best ways to protect herself from the flu: Receive the yearly flu vaccine, practice good hand hygiene washing with soap and also using hand sanitizer when available, eat a nutritious meals, get adequate rest, hydrate appropriately   Please contact the office if your symptoms worsen or you have concerns that you are not improving.   Thank you for choosing [facility] for your healthcare, and for allowing [place] to partner with you on your healthcare journey. I am thankful to be able to provide care to you today.   Wyn Quaker FNP-C    Sleep Apnea Sleep apnea is a condition in which breathing pauses or becomes shallow during sleep. Episodes of sleep apnea usually last 10 seconds or longer, and they may occur as many as 20 times an hour. Sleep apnea disrupts your sleep and keeps your body from getting the rest that it needs. This condition can increase your risk of certain health problems, including:  Heart attack.  Stroke.  Obesity.  Diabetes.  Heart  failure.  Irregular heartbeat. There are three kinds of sleep apnea:  Obstructive sleep apnea. This kind is caused by a blocked or collapsed airway.  Central sleep apnea. This kind happens when the part of the brain that controls breathing does not send the correct signals to the muscles that control breathing.  Mixed sleep apnea. This is a combination of obstructive and central sleep apnea. What are the causes? The most common cause of this condition is a collapsed or blocked airway. An airway can collapse or become blocked if:  Your throat muscles are abnormally relaxed.  Your tongue and tonsils are larger than normal.  You are overweight.  Your airway is smaller than normal. What increases the risk? This condition is more likely to develop in people who:  Are overweight.  Smoke.  Have a smaller than normal airway.  Are elderly.  Are male.  Drink alcohol.  Take sedatives or tranquilizers.  Have a family history of sleep apnea. What are the signs or symptoms? Symptoms of this condition include:  Trouble staying asleep.  Daytime sleepiness and tiredness.  Irritability.  Loud snoring.  Morning headaches.  Trouble concentrating.  Forgetfulness.  Decreased interest in sex.  Unexplained sleepiness.  Mood swings.  Personality changes.  Feelings of depression.  Waking up often during the night to urinate.  Dry mouth.  Sore throat. How is this diagnosed? This condition may be diagnosed with:  A medical history.  A physical exam.  A series of tests that are done while you are sleeping (sleep study). These tests are usually done in a sleep lab, but they may also be done at home. How is this treated? Treatment for this condition aims to restore normal breathing and to ease symptoms during sleep. It may involve managing health issues that can affect breathing, such as high blood pressure or obesity. Treatment may include:  Sleeping on your side.   Using a decongestant if you have nasal congestion.  Avoiding the use of depressants, including alcohol, sedatives, and narcotics.  Losing weight if you are overweight.  Making changes to your diet.  Quitting smoking.  Using a device to open your airway while you sleep, such as: ? An oral appliance. This is a custom-made mouthpiece that shifts your lower jaw forward. ? A continuous positive airway pressure (CPAP) device. This device delivers oxygen to your airway through a mask. ? A nasal expiratory positive airway pressure (EPAP) device. This device has valves that you put into each nostril. ? A bi-level positive airway pressure (BPAP) device. This device delivers oxygen to your airway through a mask.  Surgery if other treatments do not work. During surgery, excess tissue is removed to create a wider airway. It is important to get treatment for sleep apnea. Without treatment, this condition can lead to:  High blood pressure.  Coronary artery disease.  (Men) An inability to achieve or maintain an erection (impotence).  Reduced thinking abilities. Follow these instructions at home:  Make any lifestyle changes that your health care provider recommends.  Eat a healthy, well-balanced diet.  Take over-the-counter and prescription medicines only as told by your health care provider.  Avoid using depressants, including alcohol, sedatives, and narcotics.  Take steps to lose weight if you are overweight.  If you were given a device to open your airway while you sleep, use it only as told by your health care provider.  Do not use any tobacco products, such as cigarettes, chewing tobacco, and e-cigarettes. If you need help quitting, ask your health care provider.  Keep all follow-up visits as told by your health care provider. This is important. Contact a health care provider if:  The device that you received to open your airway during sleep is uncomfortable or does not seem to be  working.  Your symptoms do not improve.  Your symptoms get worse. Get help right away if:  You develop chest pain.  You develop shortness of breath.  You develop discomfort in your back, arms, or stomach.  You have trouble speaking.  You have weakness on one side of your body.  You have drooping in your face. These symptoms may represent a serious problem that is an emergency. Do not wait to see if the symptoms will go away. Get medical help right away. Call  your local emergency services (911 in the [place] Do not drive yourself to the hospital. This information is not intended to replace advice given to you by your health care provider. Make sure you discuss any questions you have with your health care provider. Document Released: 03/20/2002 Document Revised: 10/26/2016 Document Reviewed: 01/07/2015 Elsevier Interactive Patient Education  2019 [employer].

## 2018-09-23 ENCOUNTER — Telehealth: Payer: Self-pay | Admitting: Pulmonary Disease

## 2018-09-23 DIAGNOSIS — G4733 Obstructive sleep apnea (adult) (pediatric): Secondary | ICD-10-CM

## 2018-09-23 NOTE — Telephone Encounter (Signed)
Spoke with pt, he is requesting an Airfit F-20 full face resmed 159. He would like the order sent to [place] [facility] [place]. Order placed.  Assessment & Plan Note by Lauraine Rinne, NP at 09/22/2018 4:01 PM Author: Lauraine Rinne, NP Author Type: Nurse Practitioner Filed: 09/22/2018 4:03 PM  Note Status: Written Cosign: Cosign Not Required Encounter Date: 09/22/2018  Problem: OSA (obstructive sleep apnea)  Editor: Lauraine Rinne, NP (Nurse Practitioner)    Assessment: Mild obstructive sleep apnea in 2015 sleep study CPAP compliance report today shows excellent compliance and well-controlled AHI of 0.5 Patient is interested in purchasing a fullface mask from [email or web address].  Plan: Continue CPAP as prescribed Contact our office when you know exactly what mask you are interested in receiving from [email or web address], we will likely need to print a prescription and mail this to you so that we you can get this from [email or web address] Contact our office when you do receive your new mask so we can check your compliance in 6 to 8 weeks after you start using it Follow-up with our office in 1 year Continue to work on weight loss and exercise

## 2018-09-26 ENCOUNTER — Other Ambulatory Visit: Payer: Self-pay | Admitting: Internal Medicine

## 2018-09-27 DIAGNOSIS — G4733 Obstructive sleep apnea (adult) (pediatric): Secondary | ICD-10-CM | POA: Diagnosis not present

## 2018-09-29 ENCOUNTER — Telehealth: Payer: Self-pay | Admitting: Pulmonary Disease

## 2018-09-29 NOTE — Telephone Encounter (Signed)
Called and spoke with pt. Pt stated that he did receive his new cpap mask yesterday 6/17. Pt wanted to know what his current settings are and I stated to him back in April 2019, an order was placed to have his settings changed to 6cm. Pt verbalized understanding. Nothing further needed.

## 2018-10-12 DIAGNOSIS — R69 Illness, unspecified: Secondary | ICD-10-CM | POA: Diagnosis not present

## 2018-11-03 DIAGNOSIS — R69 Illness, unspecified: Secondary | ICD-10-CM | POA: Diagnosis not present

## 2018-11-30 NOTE — Progress Notes (Signed)
Reviewed and agree with assessment/plan.   Spencer Cardinal, MD [facility] 04/08/2016, 12:24 PM Pager:  [phone]  

## 2019-01-05 ENCOUNTER — Ambulatory Visit (INDEPENDENT_AMBULATORY_CARE_PROVIDER_SITE_OTHER): Payer: Medicare HMO

## 2019-01-05 DIAGNOSIS — Z23 Encounter for immunization: Secondary | ICD-10-CM | POA: Diagnosis not present

## 2019-01-19 IMAGING — DX DG HIP (WITH OR WITHOUT PELVIS) 2-3V*R*
3 series · 3 of 3 positions shown · non-contrast
Comparison: None.

CLINICAL DATA: Chronic right hip pain with recent progression.

EXAM:
DG HIP (WITH OR WITHOUT PELVIS) 2-3V RIGHT

[pelvis ap]
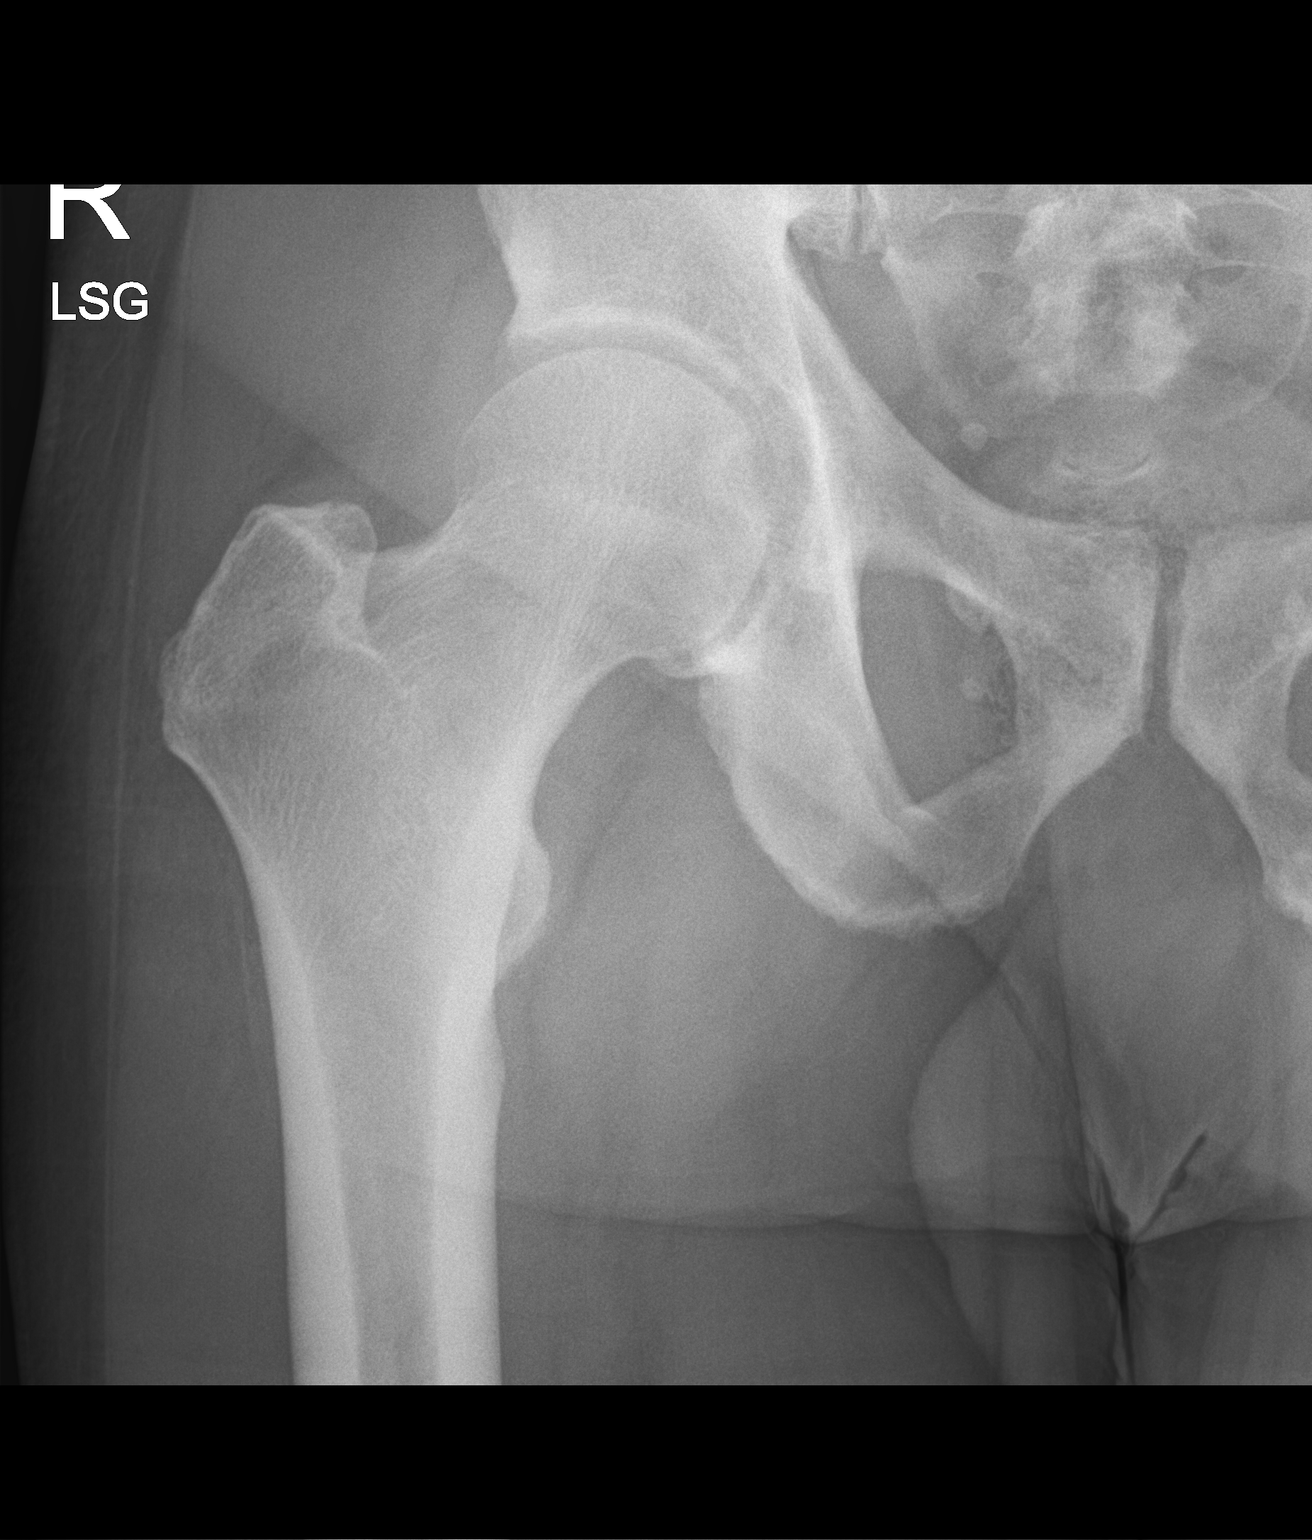

[hip ap]
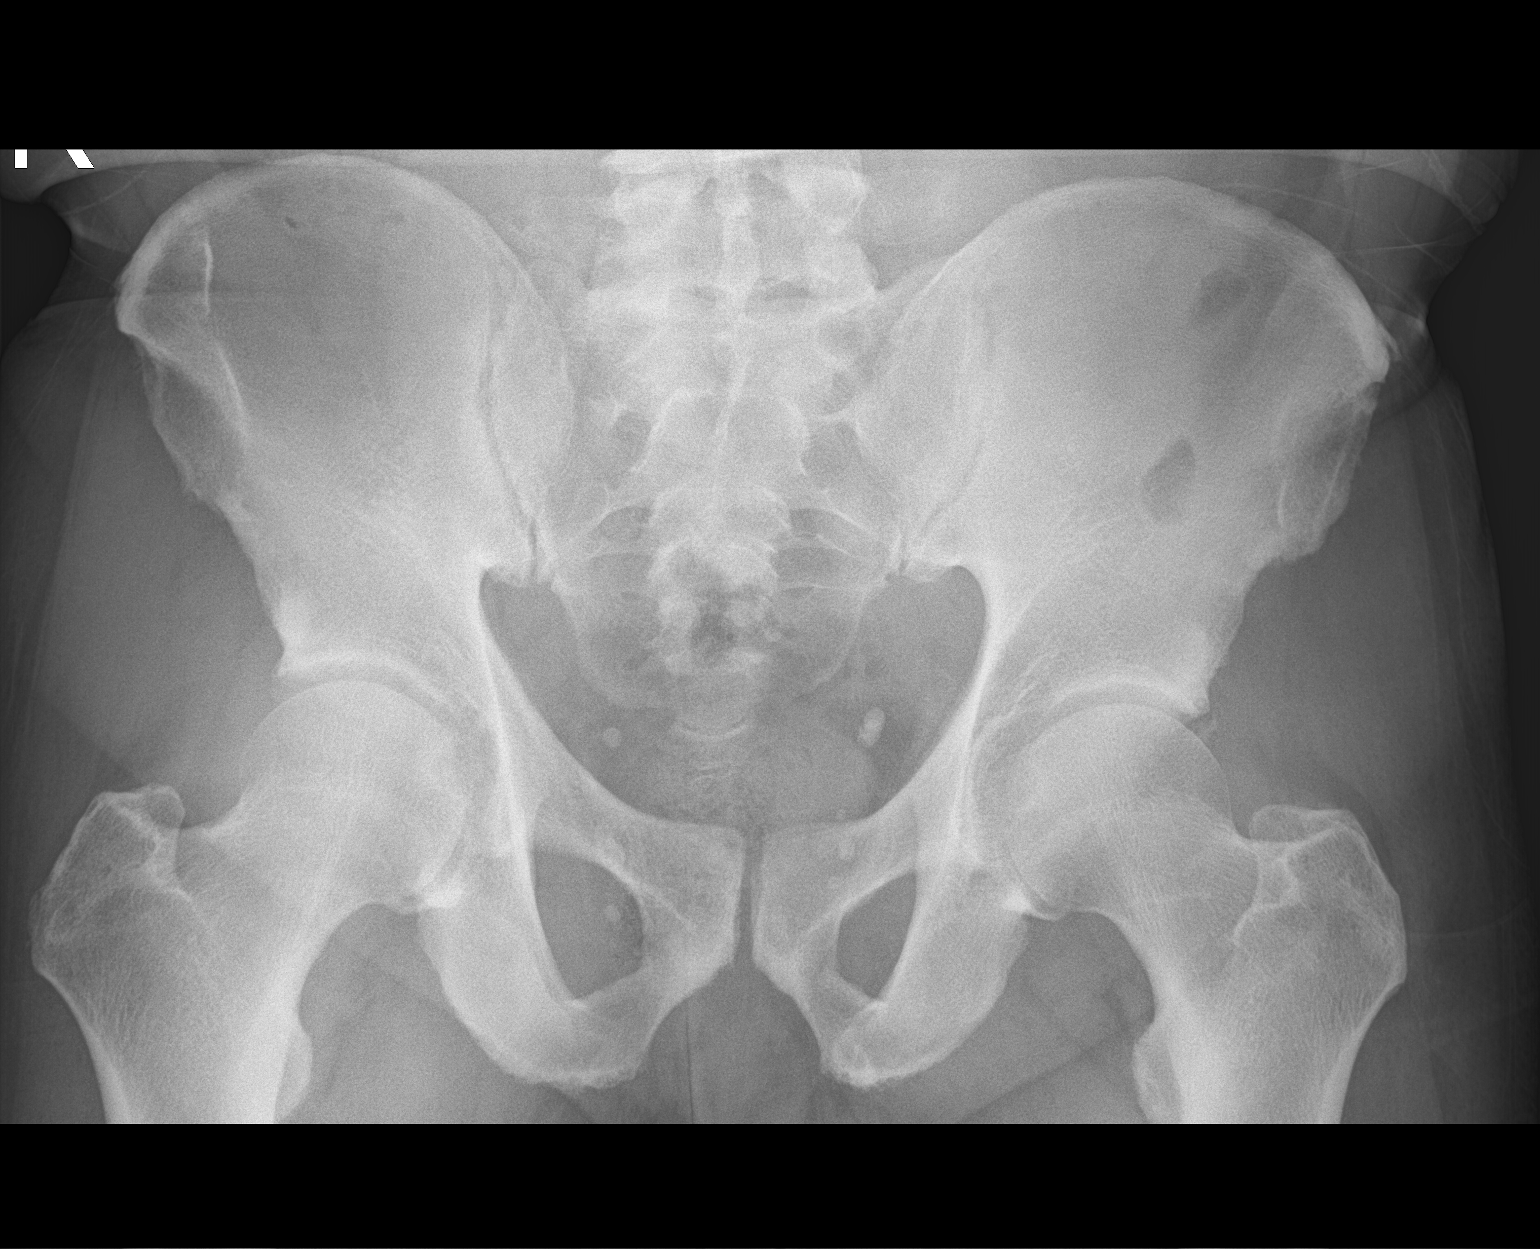

[hip lat]
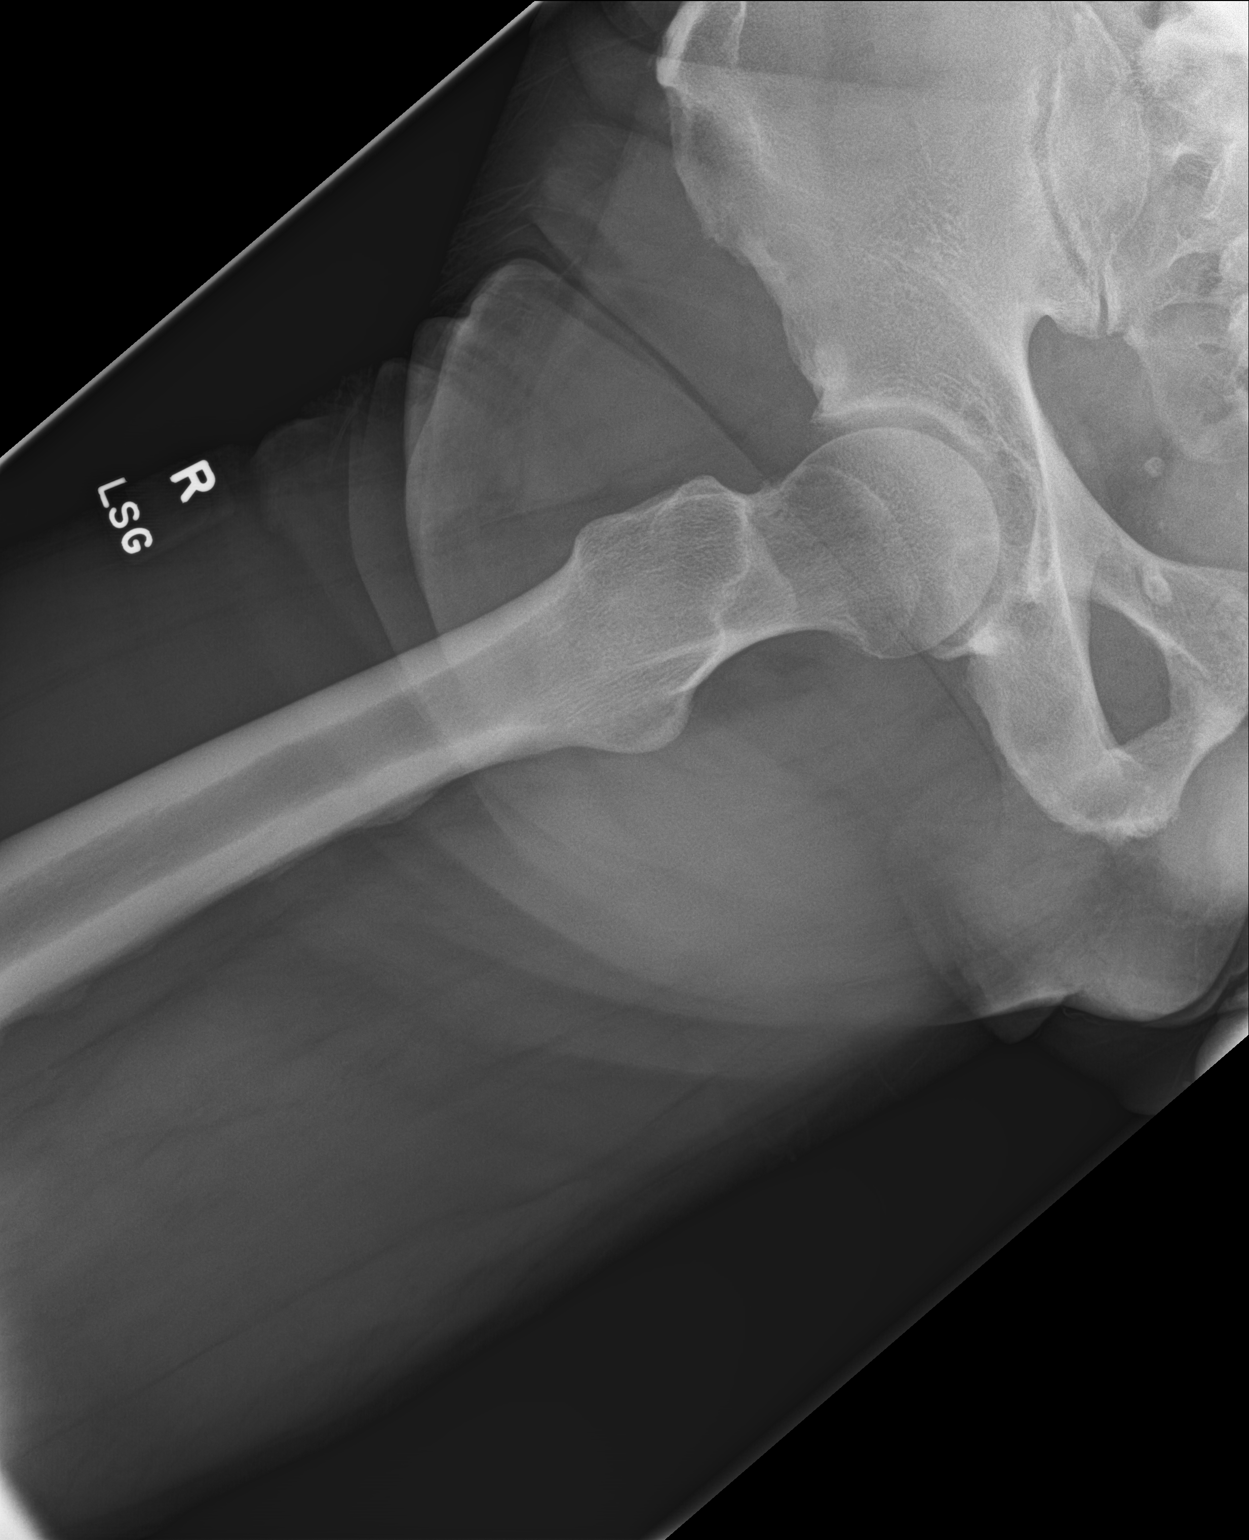

[3 of 3 positions shown; findings below may reference images not displayed]

FINDINGS: There is no evidence of hip fracture or dislocation. There is no
evidence of arthropathy or other focal bone abnormality.
IMPRESSION: Negative right hip radiographs.

## 2019-02-15 ENCOUNTER — Other Ambulatory Visit: Payer: Self-pay | Admitting: Internal Medicine

## 2019-02-17 DIAGNOSIS — H40003 Preglaucoma, unspecified, bilateral: Secondary | ICD-10-CM | POA: Diagnosis not present

## 2019-02-25 DIAGNOSIS — Z20828 Contact with and (suspected) exposure to other viral communicable diseases: Secondary | ICD-10-CM | POA: Diagnosis not present

## 2019-03-03 ENCOUNTER — Encounter: Payer: Self-pay | Admitting: Emergency Medicine

## 2019-03-03 ENCOUNTER — Ambulatory Visit
Admission: EM | Admit: 2019-03-03 | Discharge: 2019-03-03 | Disposition: A | Discharge status: Home / Self Care | Payer: Medicare HMO

## 2019-03-03 ENCOUNTER — Encounter: Payer: Self-pay | Admitting: Internal Medicine

## 2019-03-03 ENCOUNTER — Other Ambulatory Visit: Payer: Self-pay

## 2019-03-03 ENCOUNTER — Telehealth: Payer: Self-pay | Admitting: Emergency Medicine

## 2019-03-03 DIAGNOSIS — B349 Viral infection, unspecified: Secondary | ICD-10-CM

## 2019-03-03 DIAGNOSIS — I10 Essential (primary) hypertension: Secondary | ICD-10-CM

## 2019-03-03 DIAGNOSIS — Z20822 Contact with and (suspected) exposure to covid-19: Secondary | ICD-10-CM

## 2019-03-03 DIAGNOSIS — Z7689 Persons encountering health services in other specified circumstances: Secondary | ICD-10-CM | POA: Diagnosis not present

## 2019-03-03 NOTE — ED Triage Notes (Signed)
Pt states "on Monday I started having some chills, and heavy body sweats". Pt endorses having heavy sweats when he walks. Pt also c/o headaches. Tested negative for covid on 02/25/2019

## 2019-03-03 NOTE — Telephone Encounter (Signed)
Patient called back and I advised of Regina's comments to be seen at [facility]. Patient was given information for Urgent Care in [place] through [place]. Patient verbalized understanding.

## 2019-03-03 NOTE — ED Provider Notes (Signed)
[name]    CSN: [id number] Arrival date & time: 03/03/19  1429      History   Chief Complaint Chief Complaint  Patient presents with  . Chills  . Excessive Sweating    HPI [name] is a 73 y.o. male.   Patient presents with chills and body sweats x 5 days.  He reports having a fever and headache earlier in the week.  T-max 100.0.  He had a negative COVID test on 02/25/2019 but was asymptomatic at that time.  He denies sore throat, cough, shortness of breath, abdominal pain, vomiting, diarrhea, rash, or other symptoms.  No treatments attempted at home.  The history is provided by the patient.    Past Medical History:  Diagnosis Date  . Chicken pox   . ED (erectile dysfunction)   . HTN (hypertension)    a. 11/2010 Echo: EF 55-65%.  . Obstructive sleep apnea     Patient Active Problem List   Diagnosis Date Noted  . Multiple thyroid nodules 10/29/2014  . OSA (obstructive sleep apnea) 09/16/2013  . Renal insufficiency 07/04/2013  . Benign essential HTN 06/15/2013  . ED (erectile dysfunction) 04/28/2013  . Migraines 04/28/2013    Past Surgical History:  Procedure Laterality Date  . KNEE SURGERY Left    1970  . SHOULDER SURGERY Left    hx dislocatino with temp  pin age 28 years  from [facility] Medications    Prior to Admission medications   Medication Sig Start Date End Date Taking? Authorizing Provider  acetaminophen (TYLENOL) 500 MG tablet Take 500 mg by mouth every 6 (six) hours as needed for moderate pain.    [provider]  amLODipine (NORVASC) 5 MG tablet Take 1 tablet (5 mg total) by mouth daily. MUST SCHEDULE PHYSICAL EXAM 02/15/19   Jearld Fenton, NP  aspirin 81 MG tablet Take 81 mg by mouth daily.    [provider]  cholecalciferol (VITAMIN D) 1000 UNITS tablet Take 1,000 Units by mouth daily. Patient takes 3 times a week    [provider]  diclofenac (VOLTAREN) 75 MG EC tablet TAKE 1 TABLET  BY MOUTH TWICE A DAY Patient not taking: Reported on 09/22/2018 04/27/18   Copland, Frederico Hamman, MD  sildenafil (REVATIO) 20 MG tablet Take 1-5 tabs as needed for sexual intercourse 12/19/17   Jearld Fenton, NP  SUMAtriptan (IMITREX) 100 MG tablet Take 1 tablet (100 mg total) by mouth every 2 (two) hours as needed for migraine. May repeat in 2 hours if headache persists or recurs. 09/12/15   Jearld Fenton, NP    Family History Family History  Problem Relation Age of Onset  . Arthritis Mother   . Cancer Mother   . Hypertension Mother   . Colon cancer Brother     Social History Social History   Tobacco Use  . Smoking status: Former Smoker    Packs/day: 0.50    Years: 10.00    Pack years: 5.00    Types: Cigarettes    Quit date: 07/12/1976    Years since quitting: 42.6  . Smokeless tobacco: Never Used  . Tobacco comment: Quit 1978  Substance Use Topics  . Alcohol use: No    Alcohol/week: 0.0 standard drinks  . Drug use: No     Allergies   Naproxen   Review of Systems Review of Systems  Constitutional: Positive for chills, diaphoresis and fever.  HENT: Negative for  congestion, ear pain, rhinorrhea and sore throat.   Eyes: Negative for pain and visual disturbance.  Respiratory: Negative for cough and shortness of breath.   Cardiovascular: Negative for chest pain and palpitations.  Gastrointestinal: Negative for abdominal pain, diarrhea, nausea and vomiting.  Genitourinary: Negative for dysuria and hematuria.  Musculoskeletal: Negative for arthralgias and back pain.  Skin: Negative for color change and rash.  Neurological: Positive for headaches. Negative for seizures and syncope.  All other systems reviewed and are negative.    Physical Exam Triage Vital Signs ED Triage Vitals [03/03/19 1431]  Enc Vitals Group     BP      Pulse      Resp      Temp      Temp src      SpO2      Weight      Height      Head Circumference      Peak Flow      Pain Score 0     Pain  Loc      Pain Edu?      Excl. in [place]?    No data found.  Updated Vital Signs BP (!) 162/89   Pulse 60   Temp 97.8 F (36.6 C)   Resp 18   SpO2 97%   Visual Acuity Right Eye Distance:   Left Eye Distance:   Bilateral Distance:    Right Eye Near:   Left Eye Near:    Bilateral Near:     Physical Exam Vitals signs and nursing note reviewed.  Constitutional:      General: He is not in acute distress.    Appearance: He is well-developed. He is not ill-appearing.  HENT:     Head: Normocephalic and atraumatic.     Right Ear: Tympanic membrane normal.     Left Ear: Tympanic membrane normal.     Nose: Nose normal.     Mouth/Throat:     Mouth: Mucous membranes are moist.     Pharynx: Oropharynx is clear.  Eyes:     Conjunctiva/sclera: Conjunctivae normal.  Neck:     Musculoskeletal: Neck supple.  Cardiovascular:     Rate and Rhythm: Normal rate and regular rhythm.     Heart sounds: No murmur.  Pulmonary:     Effort: Pulmonary effort is normal. No respiratory distress.     Breath sounds: Normal breath sounds. No wheezing or rhonchi.  Abdominal:     General: Bowel sounds are normal.     Palpations: Abdomen is soft.     Tenderness: There is no abdominal tenderness. There is no guarding or rebound.  Skin:    General: Skin is warm and dry.     Findings: No rash.  Neurological:     General: No focal deficit present.     Mental Status: He is alert and oriented to person, place, and time.      UC Treatments / Results  Labs (all labs ordered are listed, but only abnormal results are displayed) Labs Reviewed - No data to display  EKG   Radiology No results found.  Procedures Procedures (including critical care time)  Medications Ordered in UC Medications - No data to display  Initial Impression / Assessment and Plan / UC Course  I have reviewed the triage vital signs and the nursing notes.  Pertinent labs & imaging results that were available during my care  of the patient were reviewed by me and considered in my  medical decision making (see chart for details).   Viral illness, suspected Covid.  Elevated blood pressure with known hypertension.  Instructed patient to continue taking Tylenol as needed for fever or chills.  COVID test performed here.  Instructed patient to self quarantine until the test result is back.  Instructed patient to go to the emergency department if develops high fever, shortness of breath, severe diarrhea, or other concerning symptoms.  Discussed with patient that his blood pressure is elevated today needs to be rechecked by his PCP in 2 to 4 weeks.  Patient agrees with plan of care.     Final Clinical Impressions(s) / UC Diagnoses   Final diagnoses:  Viral illness  Suspected COVID-19 virus infection  Elevated blood pressure reading in office with diagnosis of hypertension     Discharge Instructions     Your COVID test is pending.  You should self quarantine until your test result is back and is negative.    Go to the emergency department if you develop high fever, shortness of breath, severe diarrhea, or other concerning symptoms.    Your blood pressure is elevated today at 162/89.  Please have this rechecked by your primary care provider in 2 weeks.      ED Prescriptions    None     PDMP not reviewed this encounter.   Sharion Balloon, NP 03/03/19 [phone]

## 2019-03-03 NOTE — Discharge Instructions (Addendum)
Your COVID test is pending.  You should self quarantine until your test result is back and is negative.    Go to the emergency department if you develop high fever, shortness of breath, severe diarrhea, or other concerning symptoms.    Your blood pressure is elevated today at 162/89.  Please have this rechecked by your primary care provider in 2 weeks.

## 2019-03-06 LAB — NOVEL CORONAVIRUS, NAA: SARS-CoV-2, NAA: NOT DETECTED

## 2019-03-10 ENCOUNTER — Encounter: Payer: Self-pay | Admitting: Internal Medicine

## 2019-03-14 ENCOUNTER — Ambulatory Visit: Payer: Medicare HMO | Admitting: Internal Medicine

## 2019-03-14 ENCOUNTER — Other Ambulatory Visit: Payer: Self-pay

## 2019-03-14 ENCOUNTER — Encounter: Payer: Self-pay | Admitting: Internal Medicine

## 2019-03-14 ENCOUNTER — Ambulatory Visit (INDEPENDENT_AMBULATORY_CARE_PROVIDER_SITE_OTHER): Payer: Medicare HMO | Admitting: Internal Medicine

## 2019-03-14 VITALS — BP 146/98 | HR 81 | Temp 97.6°F | Wt 210.0 lb

## 2019-03-14 DIAGNOSIS — T23121A Burn of first degree of single right finger (nail) except thumb, initial encounter: Secondary | ICD-10-CM | POA: Diagnosis not present

## 2019-03-14 DIAGNOSIS — X158XXA Contact with other hot household appliances, initial encounter: Secondary | ICD-10-CM

## 2019-03-14 DIAGNOSIS — I10 Essential (primary) hypertension: Secondary | ICD-10-CM | POA: Diagnosis not present

## 2019-03-14 DIAGNOSIS — B349 Viral infection, unspecified: Secondary | ICD-10-CM | POA: Diagnosis not present

## 2019-03-14 NOTE — Progress Notes (Signed)
Subjective:    Patient ID: [name], male    DOB: 08-17-1945, 73 y.o.   MRN: [id number]  HPI  Pt presents to the clinic today for UC follow up. He went to [facility]/03/2019 with c/o headache, fever, chills and body aches. He reports having a headache earlier in that week and fever for one day (Tuesday) reaching 100.0. He had a negative COVID test on 02/25/2019 but was asymptomatic at that time and then got tested again during the UC visit which was negative. He was was told it may be do to a viral illness and was discharged with symptomatic treatment and to follow proper quarantine procedures. His BP at UC was 162/89. He mentions he has been taking Amlodipine and is still having high BP readings. His BP today is 146/98. He mentions he has had an increased in stress. Since the visit he has not had any sweats, HA, blurry vision, CP, SOB or GI symptoms.   He also reports he burned tip of his fingers (2-4th digit) on right hand with an iron yesterday. He states he put ice on his fingers to help with pain. He denies numbing, tingling or loss of sensation.   Review of Systems      Past Medical History:  Diagnosis Date  . Chicken pox   . ED (erectile dysfunction)   . HTN (hypertension)    a. 11/2010 Echo: EF 55-65%.  . Obstructive sleep apnea     Current Outpatient Medications  Medication Sig Dispense Refill  . acetaminophen (TYLENOL) 500 MG tablet Take 500 mg by mouth every 6 (six) hours as needed for moderate pain.    Marland Kitchen amLODipine (NORVASC) 5 MG tablet Take 1 tablet (5 mg total) by mouth daily. MUST SCHEDULE PHYSICAL EXAM 30 tablet 0  . aspirin 81 MG tablet Take 81 mg by mouth daily.    . cholecalciferol (VITAMIN D) 1000 UNITS tablet Take 1,000 Units by mouth daily. Patient takes 3 times a week    . diclofenac (VOLTAREN) 75 MG EC tablet TAKE 1 TABLET BY MOUTH TWICE A DAY (Patient not taking: Reported on 09/22/2018) 60 tablet 2  . sildenafil (REVATIO) 20 MG tablet Take 1-5 tabs as  needed for sexual intercourse 50 tablet 2  . SUMAtriptan (IMITREX) 100 MG tablet Take 1 tablet (100 mg total) by mouth every 2 (two) hours as needed for migraine. May repeat in 2 hours if headache persists or recurs. 10 tablet 5   No current facility-administered medications for this visit.     Allergies  Allergen Reactions  . Naproxen Itching    Family History  Problem Relation Age of Onset  . Arthritis Mother   . Cancer Mother   . Hypertension Mother   . Colon cancer Brother     Social History   Socioeconomic History  . Marital status: Married    Spouse name: Not on file  . Number of children: Not on file  . Years of education: Not on file  . Highest education level: Not on file  Occupational History  . Occupation: retired    Comment: clergy  Social Needs  . Financial resource strain: Not on file  . Food insecurity    Worry: Not on file    Inability: Not on file  . Transportation needs    Medical: Not on file    Non-medical: Not on file  Tobacco Use  . Smoking status: Former Smoker    Packs/day: 0.50    Years:  10.00    Pack years: 5.00    Types: Cigarettes    Quit date: 07/12/1976    Years since quitting: 42.6  . Smokeless tobacco: Never Used  . Tobacco comment: Quit 1978  Substance and Sexual Activity  . Alcohol use: No    Alcohol/week: 0.0 standard drinks  . Drug use: No  . Sexual activity: Yes  Lifestyle  . Physical activity    Days per week: Not on file    Minutes per session: Not on file  . Stress: Not on file  Relationships  . Social Herbalist on phone: Not on file    Gets together: Not on file    Attends religious service: Not on file    Active member of club or organization: Not on file    Attends meetings of clubs or organizations: Not on file    Relationship status: Not on file  . Intimate partner violence    Fear of current or ex partner: Not on file    Emotionally abused: Not on file    Physically abused: Not on file     Forced sexual activity: Not on file  Other Topics Concern  . 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  Respiratory: Denies difficulty breathing, shortness of breath, cough or sputum production.   Cardiovascular: Denies chest pain, chest tightness, palpitations or swelling in the hands or feet.  Gastrointestinal: Denies abdominal pain, bloating, constipation, diarrhea or blood in the stool.   Skin: Pt reports he burned fingertips of right hand.   No other specific complaints in a complete review of systems (except as listed in HPI above).  Objective:   Physical Exam  BP (!) 146/98   Pulse 81   Temp 97.6 F (36.4 C) (Temporal)   Wt 95.3 kg   SpO2 98%   BMI 29.29 kg/m  Wt Readings from Last 3 Encounters:  03/14/19 95.3 kg  11/03/17 93.7 kg  09/15/17 96.4 kg    General: Appears his stated age, well developed in NAD. Skin: There is erythema with slight discoloration to distal tip of digits 2-4 on right hand. There appears to be blisters forming on digits 3-4.  Cardiovascular: Normal rate and rhythm. S1,S2 noted.  No murmur, rubs or gallops noted. Pulmonary/Chest: Normal effort and positive vesicular breath sounds. No respiratory distress. No wheezes, rales or ronchi noted.     BMET    Component Value Date/Time   NA 139 11/26/2016 1505   K 4.1 11/26/2016 1505   CL 105 11/26/2016 1505   CO2 29 11/26/2016 1505   GLUCOSE 91 11/26/2016 1505   BUN 15 11/26/2016 1505   CREATININE 1.35 11/26/2016 1505   CALCIUM 9.9 11/26/2016 1505   GFRNONAA 47 (L) 11/17/2013 1607   GFRAA 54 (L) 11/17/2013 1607    Lipid Panel     Component Value Date/Time   CHOL 149 11/26/2016 1505   TRIG 143.0 11/26/2016 1505   HDL 48.20 11/26/2016 1505   CHOLHDL 3 11/26/2016 1505   VLDL 28.6 11/26/2016  1505   LDLCALC 72 11/26/2016 1505    CBC    Component Value Date/Time   WBC 5.3 11/26/2016 1505   RBC 4.30 11/26/2016 1505   HGB 13.1 11/26/2016 1505   HCT 38.4 (L) 11/26/2016  1505   PLT 207.0 11/26/2016 1505   MCV 89.2 11/26/2016 1505   MCH 30.0 11/17/2013 1715   MCHC 34.0 11/26/2016 1505   RDW 14.7 11/26/2016 1505   LYMPHSABS 1.8 10/29/2014 0942   MONOABS 0.4 10/29/2014 0942   EOSABS 0.1 10/29/2014 0942   BASOSABS 0.0 10/29/2014 0942    Hgb A1C No results found for: HGBA1C          Assessment & Plan:  Viral Illness:  Urgent care notes and labs reviewed Symptoms resolved Will monitor:  1st Degree Burn, Fingers Right Hand:  Continue with symptomatic treatment No further intervention needed at this time  HTN:  Elevated today, he thinks this is stress related Reinforced DASH diet He declined increasing Amlodipine to 10 mg at this time He will monitor BP and let me know if consistantly > 130/80  Return precautions discussed Webb Silversmith, NP  This visit occurred during the SARS-CoV-2 public health emergency.  Safety protocols were in place, including screening questions prior to the visit, additional usage of staff PPE, and extensive cleaning of exam room while observing appropriate contact time as indicated for disinfecting solutions.

## 2019-03-15 ENCOUNTER — Encounter: Payer: Self-pay | Admitting: Internal Medicine

## 2019-03-15 NOTE — Patient Instructions (Signed)
Burn Care, Adult A burn is an injury to the skin or the tissues under the skin. There are three types of burns:  First degree. These burns may cause the skin to be red and a bit swollen.  Second degree. These burns are very painful and cause the skin to be very red. The skin may also leak fluid, look shiny, and start to have blisters.  Third degree. These burns cause permanent damage. They turn the skin white or black and make it look charred, dry, and leathery. Taking care of your burn properly can help to prevent pain and infection. It can also help the burn to heal more quickly. How is this treated? Right after a burn:  Rinse or soak the burn under cool water. Do this for several minutes. Do not put ice on your burn. That can cause more damage.  Lightly cover the burn with a clean (sterile) cloth (dressing). Burn care  Raise (elevate) the injured area above the level of your heart while sitting or lying down.  Follow instructions from your doctor about: ? How to clean and take care of the burn. ? When to change and remove the cloth.  Check your burn every day for signs of infection. Check for: ? More redness, swelling, or pain. ? Warmth. ? Pus or a bad smell. Medicine   Take over-the-counter and prescription medicines only as told by your doctor.  If you were prescribed antibiotic medicine, take or apply it as told by your doctor. Do not stop using the antibiotic even if your condition improves. General instructions  To prevent infection: ? Do not put butter, oil, or other home treatments on the burn. ? Do not scratch or pick at the burn. ? Do not break any blisters. ? Do not peel skin.  Do not rub your burn, even when you are cleaning it.  Protect your burn from the sun. Contact a doctor if:  Your condition does not get better.  Your condition gets worse.  You have a fever.  Your burn looks different or starts to have black or red spots on it.  Your burn  feels warm to the touch.  Your pain is not controlled with medicine. Get help right away if:  You have redness, swelling, or pain at the site of the burn.  You have fluid, blood, or pus coming from your burn.  You have red streaks near the burn.  You have very bad pain. This information is not intended to replace advice given to you by your health care provider. Make sure you discuss any questions you have with your health care provider. Document Released: 01/07/2008 Document Revised: 07/20/2018 Document Reviewed: 09/17/2015 Elsevier Patient Education  2020 Elsevier Inc.  

## 2019-03-16 ENCOUNTER — Telehealth: Payer: Self-pay

## 2019-03-16 ENCOUNTER — Other Ambulatory Visit: Payer: Self-pay | Admitting: Internal Medicine

## 2019-03-16 NOTE — Telephone Encounter (Signed)
Continue 10 mg of Amlodipine. Have him return for a visit in 1 week for BP check. Bring his cuff so we can compare readings.

## 2019-03-16 NOTE — Telephone Encounter (Signed)
Patient states he was here for an OV on 03/14/2019 and states his b/p is still elevated. Yesterday at the dentist office it was 157/98 and this morning it was 161/104. Patient has had a tight sensation in the back of his head since yesterday but otherwise feels ok. He did takes Amlodipine 5 mg 2 tablets yesterday. No chest pain, no tightness, no numbness, no slurred speech, no visual disturbance. CB [phone]

## 2019-03-17 ENCOUNTER — Encounter: Payer: Self-pay | Admitting: Internal Medicine

## 2019-03-24 ENCOUNTER — Encounter: Payer: Self-pay | Admitting: Internal Medicine

## 2019-03-27 ENCOUNTER — Encounter: Payer: Self-pay | Admitting: Internal Medicine

## 2019-03-27 ENCOUNTER — Ambulatory Visit (INDEPENDENT_AMBULATORY_CARE_PROVIDER_SITE_OTHER): Payer: Medicare HMO | Admitting: Internal Medicine

## 2019-03-27 ENCOUNTER — Other Ambulatory Visit: Payer: Self-pay

## 2019-03-27 DIAGNOSIS — I10 Essential (primary) hypertension: Secondary | ICD-10-CM | POA: Diagnosis not present

## 2019-03-27 MED ORDER — AMLODIPINE BESYLATE 10 MG PO TABS: 10.0000 mg | ORAL_TABLET | Freq: Every day | ORAL | 3 refills | Status: DC

## 2019-03-27 NOTE — Patient Instructions (Signed)

## 2019-03-27 NOTE — Assessment & Plan Note (Signed)
Goal < 150/90 Controlled on Amlodipine 10 mg, refilled today Continue DASH diet

## 2019-03-27 NOTE — Progress Notes (Signed)
Subjective:    Patient ID: [name], male    DOB: 01/16/1946, 73 y.o.   MRN: [id number]  HPI  Pt presents to the clinic today for follow up HTN. He had noticed his BP had been elevated. He increase Amlodipine to 10 mg daily and has been taking as prescribed. He reports he took an extra dose prior to his appt today.  His BP today is 140/86.   Review of Systems      Past Medical History:  Diagnosis Date  . Chicken pox   . ED (erectile dysfunction)   . HTN (hypertension)    a. 11/2010 Echo: EF 55-65%.  . Obstructive sleep apnea     Current Outpatient Medications  Medication Sig Dispense Refill  . acetaminophen (TYLENOL) 500 MG tablet Take 500 mg by mouth every 6 (six) hours as needed for moderate pain.    Marland Kitchen amLODipine (NORVASC) 5 MG tablet Take 1 tablet (5 mg total) by mouth daily. 90 tablet 0  . aspirin 81 MG tablet Take 81 mg by mouth daily.    . cholecalciferol (VITAMIN D) 1000 UNITS tablet Take 1,000 Units by mouth daily. Patient takes 3 times a week    . sildenafil (REVATIO) 20 MG tablet Take 1-5 tabs as needed for sexual intercourse 50 tablet 2  . SUMAtriptan (IMITREX) 100 MG tablet Take 1 tablet (100 mg total) by mouth every 2 (two) hours as needed for migraine. May repeat in 2 hours if headache persists or recurs. 10 tablet 5   No current facility-administered medications for this visit.    Allergies  Allergen Reactions  . Naproxen Itching    Family History  Problem Relation Age of Onset  . Arthritis Mother   . Cancer Mother   . Hypertension Mother   . Colon cancer Brother     Social History   Socioeconomic History  . Marital status: Married    Spouse name: Not on file  . Number of children: Not on file  . Years of education: Not on file  . Highest education level: Not on file  Occupational History  . Occupation: retired    Comment: clergy  Tobacco Use  . Smoking status: Former Smoker    Packs/day: 0.50    Years: 10.00    Pack years: 5.00      Types: Cigarettes    Quit date: 07/12/1976    Years since quitting: 42.7  . Smokeless tobacco: Never Used  . Tobacco comment: Quit 1978  Substance and Sexual Activity  . Alcohol use: No    Alcohol/week: 0.0 standard drinks  . Drug use: No  . Sexual activity: Yes  Other Topics Concern  . 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 many years in [place] Determinants of Health   Financial Resource Strain:   . Difficulty of Paying Living Expenses: Not on file  Food Insecurity:   . Worried About Charity fundraiser in the Last Year: Not on file  . Ran Out of Food in the Last Year: Not on file  Transportation Needs:   . Lack of Transportation (Medical): Not on file  . Lack of Transportation (Non-Medical): Not on file  Physical Activity:   . Days of Exercise per Week: Not on file  . Minutes of Exercise per Session: Not on file  Stress:   . Feeling of Stress :  Not on file  Social Connections:   . Frequency of Communication with Friends and Family: Not on file  . Frequency of Social Gatherings with Friends and Family: Not on file  . Attends Religious Services: Not on file  . Active Member of Clubs or Organizations: Not on file  . Attends Archivist Meetings: Not on file  . Marital Status: Not on file  Intimate Partner Violence:   . Fear of Current or Ex-Partner: Not on file  . Emotionally Abused: Not on file  . Physically Abused: Not on file  . Sexually Abused: Not on file     Constitutional: Denies fever, malaise, fatigue, headache or abrupt weight changes.  Respiratory: Denies difficulty breathing, shortness of breath, cough or sputum production.   Cardiovascular: Denies chest pain, chest tightness, palpitations or swelling in the hands or feet.  Neurological: Denies dizziness, difficulty with memory, difficulty with speech or problems with balance and coordination.     No other specific complaints in a complete review of systems (except as listed in HPI above).  Objective:   Physical Exam   There were no vitals taken for this visit. Wt Readings from Last 3 Encounters:  03/14/19 210 lb (95.3 kg)  11/03/17 206 lb 8 oz (93.7 kg)  09/15/17 212 lb 8 oz (96.4 kg)    General: Appears his stated age, well developed, well nourished in NAD. Cardiovascular: Normal rate and rhythm. S1,S2 noted.  No murmur, rubs or gallops noted. No JVD or BLE edema.  Pulmonary/Chest: Normal effort and positive vesicular breath sounds. No respiratory distress. No wheezes, rales or ronchi noted.  Neurological: Alert and oriented.    BMET    Component Value Date/Time   NA 139 11/26/2016 1505   K 4.1 11/26/2016 1505   CL 105 11/26/2016 1505   CO2 29 11/26/2016 1505   GLUCOSE 91 11/26/2016 1505   BUN 15 11/26/2016 1505   CREATININE 1.35 11/26/2016 1505   CALCIUM 9.9 11/26/2016 1505   GFRNONAA 47 (L) 11/17/2013 1607   GFRAA 54 (L) 11/17/2013 1607    Lipid Panel     Component Value Date/Time   CHOL 149 11/26/2016 1505   TRIG 143.0 11/26/2016 1505   HDL 48.20 11/26/2016 1505   CHOLHDL 3 11/26/2016 1505   VLDL 28.6 11/26/2016 1505   LDLCALC 72 11/26/2016 1505    CBC    Component Value Date/Time   WBC 5.3 11/26/2016 1505   RBC 4.30 11/26/2016 1505   HGB 13.1 11/26/2016 1505   HCT 38.4 (L) 11/26/2016 1505   PLT 207.0 11/26/2016 1505   MCV 89.2 11/26/2016 1505   MCH 30.0 11/17/2013 1715   MCHC 34.0 11/26/2016 1505   RDW 14.7 11/26/2016 1505   LYMPHSABS 1.8 10/29/2014 0942   MONOABS 0.4 10/29/2014 0942   EOSABS 0.1 10/29/2014 0942   BASOSABS 0.0 10/29/2014 0942    Hgb A1C No results found for: HGBA1C         Assessment & Plan:   Webb Silversmith, NP This visit occurred during the SARS-CoV-2 public health emergency.  Safety protocols were in place, including screening questions prior to the visit, additional usage of staff PPE, and extensive  cleaning of exam room while observing appropriate contact time as indicated for disinfecting solutions.

## 2019-04-11 DIAGNOSIS — Z0100 Encounter for examination of eyes and vision without abnormal findings: Secondary | ICD-10-CM | POA: Diagnosis not present

## 2019-04-24 ENCOUNTER — Encounter: Payer: Medicare HMO | Admitting: Internal Medicine

## 2019-04-25 ENCOUNTER — Encounter: Payer: Self-pay | Admitting: Internal Medicine

## 2019-04-25 ENCOUNTER — Ambulatory Visit (INDEPENDENT_AMBULATORY_CARE_PROVIDER_SITE_OTHER): Payer: Medicare HMO | Admitting: Internal Medicine

## 2019-04-25 ENCOUNTER — Other Ambulatory Visit: Payer: Self-pay

## 2019-04-25 VITALS — BP 128/84 | HR 88 | Temp 97.9°F | Ht 70.5 in | Wt 209.0 lb

## 2019-04-25 DIAGNOSIS — N529 Male erectile dysfunction, unspecified: Secondary | ICD-10-CM | POA: Diagnosis not present

## 2019-04-25 DIAGNOSIS — Z125 Encounter for screening for malignant neoplasm of prostate: Secondary | ICD-10-CM

## 2019-04-25 DIAGNOSIS — E042 Nontoxic multinodular goiter: Secondary | ICD-10-CM | POA: Diagnosis not present

## 2019-04-25 DIAGNOSIS — G4733 Obstructive sleep apnea (adult) (pediatric): Secondary | ICD-10-CM

## 2019-04-25 DIAGNOSIS — I10 Essential (primary) hypertension: Secondary | ICD-10-CM

## 2019-04-25 DIAGNOSIS — Z0000 Encounter for general adult medical examination without abnormal findings: Secondary | ICD-10-CM

## 2019-04-25 DIAGNOSIS — G43C1 Periodic headache syndromes in child or adult, intractable: Secondary | ICD-10-CM | POA: Diagnosis not present

## 2019-04-25 NOTE — Assessment & Plan Note (Signed)
Will obtain thyroid ultrasound

## 2019-04-25 NOTE — Patient Instructions (Signed)

## 2019-04-25 NOTE — Assessment & Plan Note (Signed)
Continue CPAP.  

## 2019-04-25 NOTE — Assessment & Plan Note (Signed)
Currently not an issue Will monitor 

## 2019-04-25 NOTE — Progress Notes (Signed)
HPI:  Pt presents to the clinic today for his subsequent annual Medicare Wellness Exam.   HTN: His BP today is 128/84. He is taking Amlodipine as prescribed. ECG from 10/2014 reviewed.   Frequent Headaches: No longer an issue. He has not filled Imitrex in 18 months.   OSA: He averages 7 hours of sleep with the use of his CPAP. Sleep study from 7/215 reviewed.   Past Medical History:  Diagnosis Date  . Chicken pox   . ED (erectile dysfunction)   . HTN (hypertension)    a. 11/2010 Echo: EF 55-65%.  . Obstructive sleep apnea     Current Outpatient Medications  Medication Sig Dispense Refill  . acetaminophen (TYLENOL) 500 MG tablet Take 500 mg by mouth every 6 (six) hours as needed for moderate pain.    Marland Kitchen amLODipine (NORVASC) 10 MG tablet Take 1 tablet (10 mg total) by mouth daily. 90 tablet 3  . aspirin 81 MG tablet Take 81 mg by mouth daily.    . cholecalciferol (VITAMIN D) 1000 UNITS tablet Take 1,000 Units by mouth daily. Patient takes 3 times a week    . sildenafil (REVATIO) 20 MG tablet Take 1-5 tabs as needed for sexual intercourse 50 tablet 2  . SUMAtriptan (IMITREX) 100 MG tablet Take 1 tablet (100 mg total) by mouth every 2 (two) hours as needed for migraine. May repeat in 2 hours if headache persists or recurs. 10 tablet 5   No current facility-administered medications for this visit.    Allergies  Allergen Reactions  . Naproxen Itching    Family History  Problem Relation Age of Onset  . Arthritis Mother   . Cancer Mother   . Hypertension Mother   . Colon cancer Brother     Social History   Socioeconomic History  . Marital status: Married    Spouse name: Not on file  . Number of children: Not on file  . Years of education: Not on file  . Highest education level: Not on file  Occupational History  . Occupation: retired    Comment: clergy  Tobacco Use  . Smoking status: Former Smoker    Packs/day: 0.50    Years: 10.00    Pack years: 5.00    Types:  Cigarettes    Quit date: 07/12/1976    Years since quitting: 42.8  . Smokeless tobacco: Never Used  . Tobacco comment: Quit 1978  Substance and Sexual Activity  . Alcohol use: No    Alcohol/week: 0.0 standard drinks  . Drug use: No  . Sexual activity: Yes  Other Topics Concern  . 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 many years in [place] Determinants of Health   Financial Resource Strain:   . Difficulty of Paying Living Expenses: Not on file  Food Insecurity:   . Worried About Charity fundraiser in the Last Year: Not on file  . Ran Out of Food in the Last Year: Not on file  Transportation Needs:   . Lack of Transportation (Medical): Not on file  . Lack of Transportation (Non-Medical): Not on file  Physical Activity:   . Days of Exercise per Week: Not on file  . Minutes of Exercise per Session: Not on file  Stress:   . Feeling of Stress : Not on file  Social Connections:   . Frequency of Communication with Friends  and Family: Not on file  . Frequency of Social Gatherings with Friends and Family: Not on file  . Attends Religious Services: Not on file  . Active Member of Clubs or Organizations: Not on file  . Attends Archivist Meetings: Not on file  . Marital Status: Not on file  Intimate Partner Violence:   . Fear of Current or Ex-Partner: Not on file  . Emotionally Abused: Not on file  . Physically Abused: Not on file  . Sexually Abused: Not on file    Hospitiliaztions: None  Health Maintenance:    Flu: 12/2018  Tetanus: 04/2009  Pneumovax:  04/2012  Prevnar: 09/2015  Zostavax: 01/2008  PSA: 2018  Colon Screening: 12/2015  Eye Doctor: annually   Dental Exam: biannually   Providers:   PCP: Webb Silversmith, NP  Pulmonologist: Dr. Halford Chessman   I have personally reviewed and have noted:  1. The patient's medical and social history 2. Their use of alcohol,  tobacco or illicit drugs 3. Their current medications and supplements 4. The patient's functional ability including ADL's, fall risks, home safety risks and hearing or visual impairment. 5. Diet and physical activities 6. Evidence for depression or mood disorder  Subjective:   Review of Systems:   Constitutional: Denies fever, malaise, fatigue, headache or abrupt weight changes.  HEENT: Pt reports decreased hearing. Denies eye pain, eye redness, ear pain, ringing in the ears, wax buildup, runny nose, nasal congestion, bloody nose, or sore throat. Respiratory: Denies difficulty breathing, shortness of breath, cough or sputum production.   Cardiovascular: Denies chest pain, chest tightness, palpitations or swelling in the hands or feet.  Gastrointestinal: Denies abdominal pain, bloating, constipation, diarrhea or blood in the stool.  GU: Pt reports intermittent nocturia. Denies urgency, frequency, pain with urination, burning sensation, blood in urine, odor or discharge. Musculoskeletal: Denies decrease in range of motion, difficulty with gait, muscle pain or joint pain and swelling.  Skin: Denies redness, rashes, lesions or ulcercations.  Neurological: Denies dizziness, difficulty with memory, difficulty with speech or problems with balance and coordination.  Psych: Denies anxiety, depression, SI/HI.  No other specific complaints in a complete review of systems (except as listed in HPI above).  Objective:  PE:    Wt Readings from Last 3 Encounters:  03/27/19 211 lb (95.7 kg)  03/14/19 210 lb (95.3 kg)  11/03/17 206 lb 8 oz (93.7 kg)    General: Appears his stated age, well developed, well nourished in NAD. Skin: Warm, dry and intact. No rashes noted. HEENT: Head: normal shape and size; Eyes: sclera white, no icterus, conjunctiva pink, PERRLA and EOMs intact; Neck: Neck supple, trachea midline. No masses, lumps or thyromegaly present.  Cardiovascular: Normal rate and rhythm. S1,S2  noted.  No murmur, rubs or gallops noted. No JVD or BLE edema. No carotid bruits noted. Pulmonary/Chest: Normal effort and positive vesicular breath sounds. No respiratory distress. No wheezes, rales or ronchi noted.  Abdomen: Soft and nontender. Normal bowel sounds. No distention or masses noted. Liver, spleen and kidneys non palpable. Musculoskeletal: Strength 5/5 BUE/BLE. No difficulty with gait. Neurological: Alert and oriented. Cranial nerves II-XII grossly intact. Coordination normal.  Psychiatric: Mood and affect normal. Behavior is normal. Judgment and thought content normal.    BMET    Component Value Date/Time   NA 139 11/26/2016 1505   K 4.1 11/26/2016 1505   CL 105 11/26/2016 1505   CO2 29 11/26/2016 1505   GLUCOSE 91 11/26/2016 1505   BUN 15 11/26/2016  1505   CREATININE 1.35 11/26/2016 1505   CALCIUM 9.9 11/26/2016 1505   GFRNONAA 47 (L) 11/17/2013 1607   GFRAA 54 (L) 11/17/2013 1607    Lipid Panel     Component Value Date/Time   CHOL 149 11/26/2016 1505   TRIG 143.0 11/26/2016 1505   HDL 48.20 11/26/2016 1505   CHOLHDL 3 11/26/2016 1505   VLDL 28.6 11/26/2016 1505   LDLCALC 72 11/26/2016 1505    CBC    Component Value Date/Time   WBC 5.3 11/26/2016 1505   RBC 4.30 11/26/2016 1505   HGB 13.1 11/26/2016 1505   HCT 38.4 (L) 11/26/2016 1505   PLT 207.0 11/26/2016 1505   MCV 89.2 11/26/2016 1505   MCH 30.0 11/17/2013 1715   MCHC 34.0 11/26/2016 1505   RDW 14.7 11/26/2016 1505   LYMPHSABS 1.8 10/29/2014 0942   MONOABS 0.4 10/29/2014 0942   EOSABS 0.1 10/29/2014 0942   BASOSABS 0.0 10/29/2014 0942    Hgb A1C No results found for: HGBA1C    Assessment and Plan:   Medicare Annual Wellness Visit:  Diet: He does eat meat. He consumes fruits and veggies daily. He tries to avoid fried foods. He drinks mostly water. Physical activity: Walking 4 miles 3-4 days per week. Depression/mood screen: Negative, PHQ 9 score of 0 Hearing: Intact to whispered  voice Visual acuity: Grossly normal, performs annual eye exam  ADLs: Capable Fall risk: None Home safety: Good Cognitive evaluation: Intact to orientation, naming, recall and repetition EOL planning: No adv directives (info provided), full code/ I agree  Preventative Medicine: Flu, tetanus, pneumovax, prevnar and zostovax UTD. Shingrix printed, will mail to pt. Colon screening UTD. Encouraged him to consume a balanced diet and exercise regimen. Advised him to see an eye doctor and dentist annually. Will check CBC, CMET, Lipid and PSA today. Due dates for screening exam given to pt as part of his AVS.  Next appointment: 1 year, Medicare Wellness Exam   Webb Silversmith, NP

## 2019-04-25 NOTE — Assessment & Plan Note (Signed)
Controlled on Amlodipine  CMET today

## 2019-04-26 LAB — LIPID PANEL
Cholesterol: 169 mg/dL (ref 0–200)
HDL: 52.2 mg/dL (ref >39.00)
LDL Cholesterol: 104 mg/dL — ABNORMAL HIGH (ref 0–99)
NonHDL: 117.06
Total CHOL/HDL Ratio: 3
Triglycerides: 67 mg/dL (ref 0.0–149.0)
VLDL: 13.4 mg/dL (ref 0.0–40.0)

## 2019-04-26 LAB — CBC
HCT: 38.4 % — ABNORMAL LOW (ref 39.0–52.0)
Hemoglobin: 13.3 g/dL (ref 13.0–17.0)
MCHC: 34.8 g/dL (ref 30.0–36.0)
MCV: 88 fl (ref 78.0–100.0)
Platelets: 221 10*3/uL (ref 150.0–400.0)
RBC: 4.36 Mil/uL (ref 4.22–5.81)
RDW: 14.6 % (ref 11.5–15.5)
WBC: 5.5 10*3/uL (ref 4.0–10.5)

## 2019-04-26 LAB — COMPREHENSIVE METABOLIC PANEL
ALT: 21 U/L (ref 0–53)
AST: 22 U/L (ref 0–37)
Albumin: 4.4 g/dL (ref 3.5–5.2)
Alkaline Phosphatase: 49 U/L (ref 39–117)
BUN: 14 mg/dL (ref 6–23)
CO2: 28 mEq/L (ref 19–32)
Calcium: 9.7 mg/dL (ref 8.4–10.5)
Chloride: 103 mEq/L (ref 96–112)
Creatinine, Ser: 1.43 mg/dL (ref 0.40–1.50)
GFR: 58.73 mL/min — ABNORMAL LOW (ref >60.00)
Glucose, Bld: 104 mg/dL — ABNORMAL HIGH (ref 70–99)
Potassium: 4.2 mEq/L (ref 3.5–5.1)
Sodium: 138 mEq/L (ref 135–145)
Total Bilirubin: 1.3 mg/dL — ABNORMAL HIGH (ref 0.2–1.2)
Total Protein: 7.2 g/dL (ref 6.0–8.3)

## 2019-04-26 LAB — PSA, MEDICARE: PSA: 1.82 ng/ml (ref 0.10–4.00)

## 2019-04-28 ENCOUNTER — Encounter: Payer: Self-pay | Admitting: Internal Medicine

## 2019-05-01 MED ORDER — ZOSTER VAC RECOMB ADJUVANTED 50 MCG/0.5ML IM SUSR: 0.5000 mL | Freq: Once | INTRAMUSCULAR | 0 refills | Status: AC

## 2019-05-10 ENCOUNTER — Other Ambulatory Visit: Payer: Medicare HMO

## 2019-05-16 ENCOUNTER — Encounter: Payer: Self-pay | Admitting: Internal Medicine

## 2019-05-16 ENCOUNTER — Ambulatory Visit
Admission: RE | Admit: 2019-05-16 | Discharge: 2019-05-16 | Disposition: A | Discharge status: Home / Self Care | Payer: Medicare HMO | Source: Ambulatory Visit | Attending: Internal Medicine | Admitting: Internal Medicine

## 2019-05-16 DIAGNOSIS — E041 Nontoxic single thyroid nodule: Secondary | ICD-10-CM | POA: Diagnosis not present

## 2019-05-16 DIAGNOSIS — E042 Nontoxic multinodular goiter: Secondary | ICD-10-CM

## 2019-07-06 DIAGNOSIS — R69 Illness, unspecified: Secondary | ICD-10-CM | POA: Diagnosis not present

## 2019-07-11 ENCOUNTER — Encounter: Payer: Self-pay | Admitting: Internal Medicine

## 2019-07-24 ENCOUNTER — Encounter: Payer: Self-pay | Admitting: Internal Medicine

## 2019-07-26 ENCOUNTER — Other Ambulatory Visit: Payer: Self-pay

## 2019-07-26 ENCOUNTER — Encounter: Payer: Self-pay | Admitting: Family Medicine

## 2019-07-26 ENCOUNTER — Ambulatory Visit (INDEPENDENT_AMBULATORY_CARE_PROVIDER_SITE_OTHER): Payer: Medicare HMO | Admitting: Family Medicine

## 2019-07-26 VITALS — BP 120/80 | HR 58 | Temp 98.0°F | Ht 70.5 in | Wt 209.0 lb

## 2019-07-26 DIAGNOSIS — T148XXA Other injury of unspecified body region, initial encounter: Secondary | ICD-10-CM | POA: Diagnosis not present

## 2019-07-26 NOTE — Progress Notes (Signed)
Alayssa Flinchum T. Ariellah Faust, MD, [place] at [facility] [place], [address]  Phone: [phone]  FAX: [place] - 74 y.o. male  MRN [id number]  Date of Birth: 08/16/1945  Date: 07/26/2019  PCP: Jearld Fenton, NP  Referral: Jearld Fenton, NP  Chief Complaint  Patient presents with  . Knee Pain    Left-Tripped on Steps on Sunday     This visit occurred during the SARS-CoV-2 public health emergency.  Safety protocols were in place, including screening questions prior to the visit, additional usage of staff PPE, and extensive cleaning of exam room while observing appropriate contact time as indicated for disinfecting solutions.   Subjective:   [name] is a 74 y.o. very pleasant male patient with Body mass index is 29.56 kg/m. who presents with the following:  F/u L knee pain s/p fall on Sunday: [name] is a very nice patient who I remember well.  He tripped on the stairs and landed on his left knee on Sunday.  That point he had a hard time getting up and walking away.  On the following day it improved, and he is ice it several times a day.  Since then he started walking okay on Tuesday and is doing a lot better.  Today is not really hurting all that much.  Review of Systems is noted in the HPI, as appropriate   Objective:   BP 120/80   Pulse (!) 58   Temp 98 F (36.7 C) (Temporal)   Ht 5' 10.5" (1.791 m)   Wt 209 lb (94.8 kg)   SpO2 97%   BMI 29.56 kg/m    GEN: No acute distress; alert,appropriate. PULM: Breathing comfortably in no respiratory distress PSYCH: Normally interactive.   Knee: Left Gait: Normal heel toe pattern ROM: 0-130 Effusion: neg Echymosis or edema: none Patellar tendon NT Painful PLICA: neg Patellar grind: negative Medial and lateral patellar facet loading: negative medial and lateral joint lines:NT Mcmurray's neg  Flexion-pinch neg Varus and valgus stress: stable Lachman: neg Ant and Post drawer: neg Hip abduction, IR, ER: WNL Hip flexion str: 5/5 Hip abd: 5/5 Quad: 5/5 VMO atrophy:No Hamstring concentric and eccentric: 5/5   He does have a prominent medial scar from open surgery many decades ago.  Radiology: No results found.  Assessment and Plan:     ICD-10-CM   1. Bone bruise  T14.8XXA    Pain is mostly resolved at this point, gait is normal.  I reassured him, and I think that all he might need to do is take some Tylenol and/or ice if needed.  Follow-up as needed  Follow-up: No follow-ups on file.  No orders of the defined types were placed in this encounter.  There are no discontinued medications. No orders of the defined types were placed in this encounter.   Signed,  Maud Deed. Brison Fiumara, MD   Outpatient Encounter Medications as of 07/26/2019  Medication Sig  . acetaminophen (TYLENOL) 500 MG tablet Take 500 mg by mouth every 6 (six) hours as needed for moderate pain.  Marland Kitchen amLODipine (NORVASC) 10 MG tablet Take 1 tablet (10 mg total) by mouth daily.  Marland Kitchen aspirin 81 MG tablet Take 81 mg by mouth daily.  . cholecalciferol (VITAMIN D) 1000 UNITS tablet Take 1,000 Units by mouth daily. Patient takes 3 times a week  . sildenafil (REVATIO) 20 MG tablet Take 1-5  tabs as needed for sexual intercourse  . SUMAtriptan (IMITREX) 100 MG tablet Take 1 tablet (100 mg total) by mouth every 2 (two) hours as needed for migraine. May repeat in 2 hours if headache persists or recurs.   No facility-administered encounter medications on file as of 07/26/2019.

## 2019-09-08 DIAGNOSIS — G4733 Obstructive sleep apnea (adult) (pediatric): Secondary | ICD-10-CM | POA: Diagnosis not present

## 2019-11-23 DIAGNOSIS — R69 Illness, unspecified: Secondary | ICD-10-CM | POA: Diagnosis not present

## 2020-02-15 DIAGNOSIS — R69 Illness, unspecified: Secondary | ICD-10-CM | POA: Diagnosis not present

## 2020-02-16 DIAGNOSIS — Z20822 Contact with and (suspected) exposure to covid-19: Secondary | ICD-10-CM | POA: Diagnosis not present

## 2020-03-13 DIAGNOSIS — R69 Illness, unspecified: Secondary | ICD-10-CM | POA: Diagnosis not present

## 2020-03-13 DIAGNOSIS — G4733 Obstructive sleep apnea (adult) (pediatric): Secondary | ICD-10-CM | POA: Diagnosis not present

## 2020-04-26 ENCOUNTER — Other Ambulatory Visit: Payer: Self-pay | Admitting: Internal Medicine

## 2020-05-27 ENCOUNTER — Encounter: Payer: Medicare HMO | Admitting: Internal Medicine

## 2020-07-18 ENCOUNTER — Other Ambulatory Visit: Payer: Self-pay

## 2020-07-18 ENCOUNTER — Ambulatory Visit (INDEPENDENT_AMBULATORY_CARE_PROVIDER_SITE_OTHER): Payer: Medicare HMO | Admitting: Internal Medicine

## 2020-07-18 ENCOUNTER — Encounter: Payer: Self-pay | Admitting: Internal Medicine

## 2020-07-18 VITALS — BP 122/78 | HR 58 | Temp 97.5°F | Ht 70.5 in | Wt 210.0 lb

## 2020-07-18 DIAGNOSIS — Z0000 Encounter for general adult medical examination without abnormal findings: Secondary | ICD-10-CM | POA: Diagnosis not present

## 2020-07-18 DIAGNOSIS — Z1211 Encounter for screening for malignant neoplasm of colon: Secondary | ICD-10-CM

## 2020-07-18 DIAGNOSIS — G43C1 Periodic headache syndromes in child or adult, intractable: Secondary | ICD-10-CM

## 2020-07-18 DIAGNOSIS — G4733 Obstructive sleep apnea (adult) (pediatric): Secondary | ICD-10-CM

## 2020-07-18 DIAGNOSIS — I10 Essential (primary) hypertension: Secondary | ICD-10-CM

## 2020-07-18 DIAGNOSIS — N529 Male erectile dysfunction, unspecified: Secondary | ICD-10-CM | POA: Diagnosis not present

## 2020-07-18 DIAGNOSIS — E042 Nontoxic multinodular goiter: Secondary | ICD-10-CM

## 2020-07-18 DIAGNOSIS — Z125 Encounter for screening for malignant neoplasm of prostate: Secondary | ICD-10-CM

## 2020-07-18 LAB — COMPREHENSIVE METABOLIC PANEL
ALT: 19 U/L (ref 0–53)
AST: 20 U/L (ref 0–37)
Albumin: 4.4 g/dL (ref 3.5–5.2)
Alkaline Phosphatase: 48 U/L (ref 39–117)
BUN: 15 mg/dL (ref 6–23)
CO2: 31 mEq/L (ref 19–32)
Calcium: 9.6 mg/dL (ref 8.4–10.5)
Chloride: 105 mEq/L (ref 96–112)
Creatinine, Ser: 1.53 mg/dL — ABNORMAL HIGH (ref 0.40–1.50)
GFR: 44.74 mL/min — ABNORMAL LOW (ref >60.00)
Glucose, Bld: 79 mg/dL (ref 70–99)
Potassium: 4.4 mEq/L (ref 3.5–5.1)
Sodium: 140 mEq/L (ref 135–145)
Total Bilirubin: 1 mg/dL (ref 0.2–1.2)
Total Protein: 7.1 g/dL (ref 6.0–8.3)

## 2020-07-18 LAB — LIPID PANEL
Cholesterol: 160 mg/dL (ref 0–200)
HDL: 49.9 mg/dL (ref >39.00)
LDL Cholesterol: 93 mg/dL (ref 0–99)
NonHDL: 110.13
Total CHOL/HDL Ratio: 3
Triglycerides: 88 mg/dL (ref 0.0–149.0)
VLDL: 17.6 mg/dL (ref 0.0–40.0)

## 2020-07-18 LAB — CBC
HCT: 40.1 % (ref 39.0–52.0)
Hemoglobin: 13.6 g/dL (ref 13.0–17.0)
MCHC: 33.9 g/dL (ref 30.0–36.0)
MCV: 89.5 fl (ref 78.0–100.0)
Platelets: 201 10*3/uL (ref 150.0–400.0)
RBC: 4.48 Mil/uL (ref 4.22–5.81)
RDW: 14.3 % (ref 11.5–15.5)
WBC: 4.7 10*3/uL (ref 4.0–10.5)

## 2020-07-18 LAB — PSA, MEDICARE: PSA: 1.39 ng/ml (ref 0.10–4.00)

## 2020-07-18 LAB — HEMOGLOBIN A1C: Hgb A1c MFr Bld: 5.7 % (ref 4.6–6.5)

## 2020-07-18 MED ORDER — AMLODIPINE BESYLATE 10 MG PO TABS: 1.0000 | ORAL_TABLET | Freq: Every day | ORAL | 3 refills | Status: DC

## 2020-07-18 NOTE — Patient Instructions (Signed)
Health Maintenance After Age 74 After age 74, you are at a higher risk for certain long-term diseases and infections as well as injuries from falls. Falls are a major cause of broken bones and head injuries in people who are older than age 74. Getting regular preventive care can help to keep you healthy and well. Preventive care includes getting regular testing and making lifestyle changes as recommended by your health care provider. Talk with your health care provider about:  Which screenings and tests you should have. A screening is a test that checks for a disease when you have no symptoms.  A diet and exercise plan that is right for you. What should I know about screenings and tests to prevent falls? Screening and testing are the best ways to find a health problem early. Early diagnosis and treatment give you the best chance of managing medical conditions that are common after age 74. Certain conditions and lifestyle choices may make you more likely to have a fall. Your health care provider may recommend:  Regular vision checks. Poor vision and conditions such as cataracts can make you more likely to have a fall. If you wear glasses, make sure to get your prescription updated if your vision changes.  Medicine review. Work with your health care provider to regularly review all of the medicines you are taking, including over-the-counter medicines. Ask your health care provider about any side effects that may make you more likely to have a fall. Tell your health care provider if any medicines that you take make you feel dizzy or sleepy.  Osteoporosis screening. Osteoporosis is a condition that causes the bones to get weaker. This can make the bones weak and cause them to break more easily.  Blood pressure screening. Blood pressure changes and medicines to control blood pressure can make you feel dizzy.  Strength and balance checks. Your health care provider may recommend certain tests to check your  strength and balance while standing, walking, or changing positions.  Foot health exam. Foot pain and numbness, as well as not wearing proper footwear, can make you more likely to have a fall.  Depression screening. You may be more likely to have a fall if you have a fear of falling, feel emotionally low, or feel unable to do activities that you used to do.  Alcohol use screening. Using too much alcohol can affect your balance and may make you more likely to have a fall. What actions can I take to lower my risk of falls? General instructions  Talk with your health care provider about your risks for falling. Tell your health care provider if: ? You fall. Be sure to tell your health care provider about all falls, even ones that seem minor. ? You feel dizzy, sleepy, or off-balance.  Take over-the-counter and prescription medicines only as told by your health care provider. These include any supplements.  Eat a healthy diet and maintain a healthy weight. A healthy diet includes low-fat dairy products, low-fat (lean) meats, and fiber from whole grains, beans, and lots of fruits and vegetables. Home safety  Remove any tripping hazards, such as rugs, cords, and clutter.  Install safety equipment such as grab bars in bathrooms and safety rails on stairs.  Keep rooms and walkways well-lit. Activity  Follow a regular exercise program to stay fit. This will help you maintain your balance. Ask your health care provider what types of exercise are appropriate for you.  If you need a cane or walker,   use it as recommended by your health care provider.  Wear supportive shoes that have nonskid soles.   Lifestyle  Do not drink alcohol if your health care provider tells you not to drink.  If you drink alcohol, limit how much you have: ? 0-1 drink a day for women. ? 0-2 drinks a day for men.  Be aware of how much alcohol is in your drink. In the [place] one drink equals one typical bottle of beer (12  oz), one-half glass of wine (5 oz), or one shot of hard liquor (1 oz).  Do not use any products that contain nicotine or tobacco, such as cigarettes and e-cigarettes. If you need help quitting, ask your health care provider. Summary  Having a healthy lifestyle and getting preventive care can help to protect your health and wellness after age 74.  Screening and testing are the best way to find a health problem early and help you avoid having a fall. Early diagnosis and treatment give you the best chance for managing medical conditions that are more common for people who are older than age 74.  Falls are a major cause of broken bones and head injuries in people who are older than age 74. Take precautions to prevent a fall at home.  Work with your health care provider to learn what changes you can make to improve your health and wellness and to prevent falls. This information is not intended to replace advice given to you by your health care provider. Make sure you discuss any questions you have with your health care provider. Document Revised: 07/21/2018 Document Reviewed: 02/10/2017 Elsevier Patient Education  2021 [employer]vier Inc.  

## 2020-07-18 NOTE — Assessment & Plan Note (Signed)
Continue Sildenafil as needed 

## 2020-07-18 NOTE — Assessment & Plan Note (Signed)
No longer needs follow up ultrasound per imaging recommendations 05/2019

## 2020-07-18 NOTE — Assessment & Plan Note (Signed)
Currently not an issue 

## 2020-07-18 NOTE — Assessment & Plan Note (Signed)
Continue CPAP Will monitor

## 2020-07-18 NOTE — Progress Notes (Signed)
HPI:  Patient presents the clinic today for his subsequent annual Medicare wellness exam.  He is also due to follow-up chronic conditions.  HTN: His BP today is.  He is taking Amlodipine as prescribed.  ECG from 10/2014 reviewed.  Frequent Hadaches: No longer an issue.  He is not taking any medications for this.  OSA: He averages 7 hours of sleep per night with the use of CPAP.  Sleep study from 10/2011 reviewed.  ED: Managed with sildenafil as needed.  He does not follow with urology.  Past Medical History:  Diagnosis Date  . Chicken pox   . ED (erectile dysfunction)   . HTN (hypertension)    a. 11/2010 Echo: EF 55-65%.  . Obstructive sleep apnea     Current Outpatient Medications  Medication Sig Dispense Refill  . acetaminophen (TYLENOL) 500 MG tablet Take 500 mg by mouth every 6 (six) hours as needed for moderate pain.    Marland Kitchen amLODipine (NORVASC) 10 MG tablet TAKE 1 TABLET BY MOUTH EVERY DAY 90 tablet 0  . aspirin 81 MG tablet Take 81 mg by mouth daily.    . cholecalciferol (VITAMIN D) 1000 UNITS tablet Take 1,000 Units by mouth daily. Patient takes 3 times a week    . sildenafil (REVATIO) 20 MG tablet Take 1-5 tabs as needed for sexual intercourse 50 tablet 2  . SUMAtriptan (IMITREX) 100 MG tablet Take 1 tablet (100 mg total) by mouth every 2 (two) hours as needed for migraine. May repeat in 2 hours if headache persists or recurs. 10 tablet 5   No current facility-administered medications for this visit.    Allergies  Allergen Reactions  . Naproxen Itching    Family History  Problem Relation Age of Onset  . Arthritis Mother   . Cancer Mother   . Hypertension Mother   . Colon cancer Brother     Social History   Socioeconomic History  . Marital status: Married    Spouse name: Not on file  . Number of children: Not on file  . Years of education: Not on file  . Highest education level: Not on file  Occupational History  . Occupation: retired    Comment: clergy   Tobacco Use  . Smoking status: Former Smoker    Packs/day: 0.50    Years: 10.00    Pack years: 5.00    Types: Cigarettes    Quit date: 07/12/1976    Years since quitting: 44.0  . Smokeless tobacco: Never Used  . Tobacco comment: Quit 1978  Vaping Use  . Vaping Use: Never used  Substance and Sexual Activity  . Alcohol use: No    Alcohol/week: 0.0 standard drinks  . Drug use: No  . Sexual activity: Yes  Other Topics Concern  . 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 The patient's medical and social history 2. Their use of alcohol, tobacco or illicit drugs 3. Their current medications and supplements 4. The patient's functional ability including ADL's, fall risks, home safety risks and hearing or visual impairment. 5. Diet and physical activities 6. Evidence for depression or mood disorder  Subjective:   Review of Systems:   Constitutional: Denies fever, malaise, fatigue, headache or abrupt weight changes.  HEENT: Pt reports decreased hearing, wears hearing aides, decreased night vision, wearing glasses. Denies eye pain, eye redness, ear pain, ringing in the ears, wax buildup, runny nose, nasal congestion, bloody nose, or sore  throat. Respiratory: Denies difficulty breathing, shortness of breath, cough or sputum production.   Cardiovascular: Denies chest pain, chest tightness, palpitations or swelling in the hands or feet.  Gastrointestinal: Denies abdominal pain, bloating, constipation, diarrhea or blood in the stool.  GU: Patient reports erectile dysfunction.  Denies urgency, frequency, pain with urination, burning sensation, blood in urine, odor or discharge. Musculoskeletal: Pt reports intermittent left shoulder pain. Denies decrease in range of motion, difficulty with gait, muscle pain or joint swelling.  Skin: Denies redness, rashes, lesions or ulcercations.  Neurological: Denies dizziness, difficulty with memory, difficulty with speech or problems with balance and coordination.  Psych: Denies anxiety, depression, SI/HI.  No other specific complaints in a complete review of systems (except as listed in HPI above).  Objective:  PE:   BP 122/78   Pulse (!) 58   Temp (!) 97.5 F (36.4 C) (Temporal)   Ht 5' 10.5" (1.791 m)   Wt 210 lb (95.3 kg)   SpO2 98%   BMI 29.71 kg/m   Wt Readings from Last 3 Encounters:  07/26/19 209 lb (94.8 kg)  04/25/19 209 lb (94.8 kg)  03/27/19 211 lb (95.7 kg)    General: Appears his stated age, well developed, well nourished in NAD. Skin: Warm, dry and intact. No rashes, lesions or ulcerations noted. HEENT: Head: normal shape and size; Eyes: sclera white, no icterus, conjunctiva pink, PERRLA and EOMs intact; Neck: Neck supple, trachea midline. No masses, lumps or thyromegaly present.  Cardiovascular: Normal rate and rhythm. S1,S2 noted.  No murmur, rubs or gallops noted. No JVD or BLE edema. No carotid bruits noted. Pulmonary/Chest: Normal effort and positive vesicular breath sounds. No respiratory distress. No wheezes, rales or ronchi noted.  Abdomen: Soft and nontender. Normal bowel sounds. No distention or masses noted. Liver, spleen and kidneys non  palpable. Musculoskeletal:  Strength 5/5 BUE/BLE. No signs of joint swelling.  Neurological: Alert and oriented. Cranial nerves II-XII grossly intact. Coordination normal.  Psychiatric: Mood and affect normal. Behavior is normal. Judgment and thought content normal.     BMET    Component Value Date/Time   NA 138 04/25/2019 1611   K 4.2 04/25/2019 1611   CL 103 04/25/2019 1611   CO2 28 04/25/2019 1611   GLUCOSE 104 (H) 04/25/2019 1611   BUN 14 04/25/2019 1611   CREATININE 1.43 04/25/2019 1611   CALCIUM 9.7 04/25/2019 1611   GFRNONAA 47 (L) 11/17/2013 1607   GFRAA 54 (L) 11/17/2013 1607    Lipid Panel     Component Value Date/Time   CHOL 169 04/25/2019 1611   TRIG 67.0 04/25/2019 1611   HDL 52.20 04/25/2019 1611   CHOLHDL 3 04/25/2019 1611   VLDL 13.4 04/25/2019 1611   LDLCALC 104 (  H) 04/25/2019 1611    CBC    Component Value Date/Time   WBC 5.5 04/25/2019 1611   RBC 4.36 04/25/2019 1611   HGB 13.3 04/25/2019 1611   HCT 38.4 (L) 04/25/2019 1611   PLT 221.0 04/25/2019 1611   MCV 88.0 04/25/2019 1611   MCH 30.0 11/17/2013 1715   MCHC 34.8 04/25/2019 1611   RDW 14.6 04/25/2019 1611   LYMPHSABS 1.8 10/29/2014 0942   MONOABS 0.4 10/29/2014 0942   EOSABS 0.1 10/29/2014 0942   BASOSABS 0.0 10/29/2014 0942    Hgb A1C No results found for: HGBA1C    Assessment and Plan:   Medicare Annual Wellness Visit:  Diet: He does eat meat. He consumes fruits and veggies. He tries to avoid fried foods. He drinks mostly water, Dt. Pepsi. Physical activity: None Depression/mood screen: Negative, PHQ 9 score of 0 Hearing: Intact to whispered voice Visual acuity: Grossly normal, performs annual eye exam  ADLs: Capable Fall risk: None Home safety: Good Cognitive evaluation: Intact to orientation, naming, recall and repetition EOL planning: No adv directives, full code/ I agree  Preventative Medicine: Flu shot UTD.  He will go to the pharmacy to get a tetanus injection.   Pneumovax, Prevnar, Zostavax, Shingrix and Covid UTD.  Referral to GI for screening colonoscopy.  Encouraged him to consume a balanced diet and exercise regimen.  Advised him to see an eye doctor and dentist annually.  Will check CBC, c-Met, lipid, A1c and PSA today.  Due dates for screening exam given to patient as part of his AVS.   Next appointment: 1 year, Medicare wellness exam   Webb Silversmith, NP This visit occurred during the SARS-CoV-2 public health emergency.  Safety protocols were in place, including screening questions prior to the visit, additional usage of staff PPE, and extensive cleaning of exam room while observing appropriate contact time as indicated for disinfecting solutions.

## 2020-07-18 NOTE — Assessment & Plan Note (Signed)
Controlled on Amlodipine CMET today Reinforced DASH diet

## 2020-07-19 ENCOUNTER — Encounter: Payer: Self-pay | Admitting: Internal Medicine

## 2020-07-23 NOTE — Telephone Encounter (Signed)
[facility] Night - Client Nonclinical Telephone Record AccessNurse Client [facility] Night - Client Client Site [place] Physician Webb Silversmith - NP Contact Type Call Who Is Calling Patient / Member / Family / Caregiver Caller Name Avery Phone Number [phone] Call Type Message Only Information Provided Reason for Call Returning a Call from the Office Initial [place] states he is returning a call to the office. Additional Comment Caller was provided office hours. Disp. Time Disposition Final User 07/22/2020 7:55:27 PM General Information Provided Yes Lovette Cliche Call Closed By: Lovette Cliche Transaction Date/Time: 07/22/2020 7:54:12 PM (ET)

## 2020-07-30 DIAGNOSIS — H2513 Age-related nuclear cataract, bilateral: Secondary | ICD-10-CM | POA: Diagnosis not present

## 2020-08-22 ENCOUNTER — Other Ambulatory Visit: Payer: Medicare HMO

## 2020-08-22 DIAGNOSIS — Z20822 Contact with and (suspected) exposure to covid-19: Secondary | ICD-10-CM | POA: Diagnosis not present

## 2020-08-26 ENCOUNTER — Telehealth: Payer: Self-pay | Admitting: *Deleted

## 2020-08-26 NOTE — Telephone Encounter (Signed)
Lattie Haw  CMA asked me to triage person that his wife had left my chart message for this pt in  Her chart. I spoke with pt and is basically feeling a lot better. Pt has not had coughing in 2 days and no fever since this morning.pt previously had temp of 99 2 but has not taken any fever reducing meds. Pt said symptoms started on 08/19/20 and pt tested positive on 08/24/[facility].  Pt said he is not having any chest tightness or SOB. Pt had pfizer vaccine on 06/18/19 and 07/13/19 and pfizer booster on 02/15/20. Pt also wants to know since he has had vaccines when should pt return to work and since pts wife is a pt of Dr Darnell Level will Dr Darnell Level take Mr Segundo as a pt also since Avie Echevaria NP left.

## 2020-08-26 NOTE — Telephone Encounter (Signed)
Ok to establish with me   Glad he had vaccines and he's recovering well. Continue supportive care, adding vit C, vit D, zinc may speed recovery.  Ok to Higher education careers adviser at home as long as fever free for 24 hours. So if feeling better, would be eligible to return to work - but up to patient. However needs to wear mask when around others for full 10 days from first day of symptoms (08/20/2019), through [facility] 08/29/2020.

## 2020-08-26 NOTE — Telephone Encounter (Signed)
Patient called stating that he started with symptoms of a sore throat, fever and cough on 08/20/20. Patient stated that [facility] to [facility] Thursday and was tested for [facility]. Patient stated that he got a call  Saturday that he was positive for covid. Patient stated that he had a fever until today. Patient stated that his temperature today is back to normal. Patient stated that he still has some head congestion but is feeling better and seems to be getting over covid at this time. Patient stated that he has been in quarantine. Patient stated that he needs some guidance on restrictions at this point since he had a fever until today. Patient has not had any type of visit since starting with covid symptoms. Patient denies SOB or difficulty breathing. Patient was seeing Webb Silversmith NP and has not established care with another provider at this time.

## 2020-08-27 NOTE — Telephone Encounter (Signed)
Spoke with pt relaying Dr. Synthia Innocent message.  Pt verbalizes understanding and will schedule TOC OV later.

## 2020-09-12 DIAGNOSIS — G4733 Obstructive sleep apnea (adult) (pediatric): Secondary | ICD-10-CM | POA: Diagnosis not present

## 2020-11-06 ENCOUNTER — Telehealth: Payer: Self-pay | Admitting: Internal Medicine

## 2020-11-06 NOTE — Telephone Encounter (Signed)
Spoke with patient's wife . Pt will call back to schedule with Dr. Danise Mina

## 2020-11-18 ENCOUNTER — Other Ambulatory Visit: Payer: Self-pay

## 2020-11-18 ENCOUNTER — Encounter: Payer: Self-pay | Admitting: Family Medicine

## 2020-11-18 ENCOUNTER — Ambulatory Visit (INDEPENDENT_AMBULATORY_CARE_PROVIDER_SITE_OTHER): Payer: Medicare HMO | Admitting: Family Medicine

## 2020-11-18 VITALS — BP 120/70 | HR 56 | Temp 97.4°F | Ht 70.5 in | Wt 198.3 lb

## 2020-11-18 DIAGNOSIS — I10 Essential (primary) hypertension: Secondary | ICD-10-CM

## 2020-11-18 DIAGNOSIS — G4733 Obstructive sleep apnea (adult) (pediatric): Secondary | ICD-10-CM

## 2020-11-18 DIAGNOSIS — N289 Disorder of kidney and ureter, unspecified: Secondary | ICD-10-CM

## 2020-11-18 LAB — RENAL FUNCTION PANEL
Albumin: 4.2 g/dL (ref 3.5–5.2)
BUN: 18 mg/dL (ref 6–23)
CO2: 24 mEq/L (ref 19–32)
Calcium: 9.3 mg/dL (ref 8.4–10.5)
Chloride: 105 mEq/L (ref 96–112)
Creatinine, Ser: 1.54 mg/dL — ABNORMAL HIGH (ref 0.40–1.50)
GFR: 44.28 mL/min — ABNORMAL LOW (ref >60.00)
Glucose, Bld: 87 mg/dL (ref 70–99)
Phosphorus: 2.8 mg/dL (ref 2.3–4.6)
Potassium: 4.2 mEq/L (ref 3.5–5.1)
Sodium: 138 mEq/L (ref 135–145)

## 2020-11-18 LAB — MICROALBUMIN / CREATININE URINE RATIO
Creatinine,U: 88.2 mg/dL
Microalb Creat Ratio: 0.8 mg/g (ref 0.0–30.0)
Microalb, Ur: <0.7 mg/dL (ref 0.0–1.9)

## 2020-11-18 LAB — VITAMIN D 25 HYDROXY (VIT D DEFICIENCY, FRACTURES): VITD: 41.24 ng/mL (ref 30.00–100.00)

## 2020-11-18 NOTE — Assessment & Plan Note (Addendum)
Chronic, stable. Continue current regimen.  Consider adding ACEI/ARB

## 2020-11-18 NOTE — Assessment & Plan Note (Signed)
Continue CPAP.  

## 2020-11-18 NOTE — Assessment & Plan Note (Signed)
Reviewed trend over the years with patient - GFR ranging 50-60s, however on last check down to 45. Will recheck today. Reviewed importance of good hydration status, avoiding NSAIDs and nephrotoxic agents, and good blood pressure control. Already limits salt in diet. Update vit D, Umicroalbumin

## 2020-11-18 NOTE — Progress Notes (Signed)
Patient ID: [name], male    DOB: [birth year], 75 y.o.   MRN: [id number]  This visit was conducted in person.  BP 120/70   Pulse (!) 56   Temp (!) 97.4 F (36.3 C) (Temporal)   Ht 5' 10.5" (1.791 m)   Wt 198 lb 5 oz (90 kg)   SpO2 97%   BMI 28.05 kg/m    CC: transfer care visit  Subjective:   HPI: [name] is a 75 y.o. male presenting on 11/18/2020 for Transitions Of Care (TOC from [place]. )   Prior saw Webb Silversmith NP. Last AMW 07/2020.  I see pt's wife Saletha.  Wears hearing aides.   HTN - Compliant with current antihypertensive regimen of amlodipine '10mg'$  daily. Does not routinely check blood pressures at home. No low blood pressure readings or symptoms of dizziness/syncope. Denies HA, vision changes, CP/tightness, SOB, leg swelling. Has increased walking recently 3 miles 4-5 d/wk. Fmhx HTN.   Recent labwork showed renal insufficiency with GFR 44 (07/2020) - prior GFR 58 (04/2019)  OSA on CPAP 7 hours sleep/night. Latest sleep study 10/2011.   Colon cancer screening q5 yrs last done 12/2015 Loletha Carrow). Fmhx colon cancer (brother).      Relevant past medical, surgical, family and social history reviewed and updated as indicated. Interim medical history since our last visit reviewed. Allergies and medications reviewed and updated. Outpatient Medications Prior to Visit  Medication Sig Dispense Refill   acetaminophen (TYLENOL) 500 MG tablet Take 500 mg by mouth every 6 (six) hours as needed for moderate pain.     amLODipine (NORVASC) 10 MG tablet Take 1 tablet (10 mg total) by mouth daily. 90 tablet 3   cholecalciferol (VITAMIN D) 1000 UNITS tablet Take 1,000 Units by mouth daily. Patient takes 3 times a week     sildenafil (REVATIO) 20 MG tablet Take 1-5 tabs as needed for sexual intercourse 50 tablet 2   No facility-administered medications prior to visit.     Per HPI unless specifically indicated in ROS section below Review of Systems  Objective:   BP 120/70   Pulse (!) 56   Temp (!) 97.4 F (36.3 C) (Temporal)   Ht 5' 10.5" (1.791 m)   Wt 198 lb 5 oz (90 kg)   SpO2 97%   BMI 28.05 kg/m   Wt Readings from Last 3 Encounters:  11/18/20 198 lb 5 oz (90 kg)  07/18/20 210 lb (95.3 kg)  07/26/19 209 lb (94.8 kg)      Physical Exam Vitals and nursing note reviewed.  Constitutional:      Appearance: Normal appearance. He is not ill-appearing.  Eyes:     Extraocular Movements: Extraocular movements intact.     Pupils: Pupils are equal, round, and reactive to light.  Neck:     Thyroid: No thyroid mass or thyromegaly.  Cardiovascular:     Rate and Rhythm: Normal rate and regular rhythm.     Pulses: Normal pulses.     Heart sounds: Normal heart sounds. No murmur heard. Pulmonary:     Effort: Pulmonary effort is normal. No respiratory distress.     Breath sounds: Normal breath sounds. No wheezing, rhonchi or rales.  Abdominal:     Tenderness: There is no right CVA tenderness or left CVA tenderness.  Musculoskeletal:     Cervical back: Normal range of motion and neck supple. No rigidity.     Right lower leg: No edema.  Left lower leg: No edema.  Lymphadenopathy:     Cervical: No cervical adenopathy.  Skin:    General: Skin is warm and dry.     Findings: No rash.  Neurological:     Mental Status: He is alert.  Psychiatric:        Mood and Affect: Mood normal.        Behavior: Behavior normal.      Results for orders placed or performed in visit on 07/18/20  CBC  Result Value Ref Range   WBC 4.7 4.0 - 10.5 K/uL   RBC 4.48 4.22 - 5.81 Mil/uL   Platelets 201.0 150.0 - 400.0 K/uL   Hemoglobin 13.6 13.0 - 17.0 g/dL   HCT 40.1 39.0 - 52.0 %   MCV 89.5 78.0 - 100.0 fl   MCHC 33.9 30.0 - 36.0 g/dL   RDW 14.3 11.5 - 15.5 %  Comprehensive metabolic panel  Result Value Ref Range   Sodium 140 135 - 145 mEq/L   Potassium 4.4 3.5 - 5.1 mEq/L   Chloride 105 96 - 112 mEq/L   CO2 31 19 - 32 mEq/L   Glucose, Bld 79 70 - 99  mg/dL   BUN 15 6 - 23 mg/dL   Creatinine, Ser 1.53 (H) 0.40 - 1.50 mg/dL   Total Bilirubin 1.0 0.2 - 1.2 mg/dL   Alkaline Phosphatase 48 39 - 117 U/L   AST 20 0 - 37 U/L   ALT 19 0 - 53 U/L   Total Protein 7.1 6.0 - 8.3 g/dL   Albumin 4.4 3.5 - 5.2 g/dL   GFR 44.74 (L) >60.00 mL/min   Calcium 9.6 8.4 - 10.5 mg/dL  Lipid panel  Result Value Ref Range   Cholesterol 160 0 - 200 mg/dL   Triglycerides 88.0 0.0 - 149.0 mg/dL   HDL 49.90 >39.00 mg/dL   VLDL 17.6 0.0 - 40.0 mg/dL   LDL Cholesterol 93 0 - 99 mg/dL   Total CHOL/HDL Ratio 3    NonHDL 110.13   Hemoglobin A1c  Result Value Ref Range   Hgb A1c MFr Bld 5.7 4.6 - 6.5 %  PSA, Medicare  Result Value Ref Range   PSA 1.39 0.10 - 4.00 ng/ml    Assessment & Plan:  This visit occurred during the SARS-CoV-2 public health emergency.  Safety protocols were in place, including screening questions prior to the visit, additional usage of staff PPE, and extensive cleaning of exam room while observing appropriate contact time as indicated for disinfecting solutions.   Problem List Items Addressed This Visit     Benign essential HTN - Primary    Chronic, stable. Continue current regimen.  Consider adding ACEI/ARB       Renal insufficiency    Reviewed trend over the years with patient - GFR ranging 50-60s, however on last check down to 45. Will recheck today. Reviewed importance of good hydration status, avoiding NSAIDs and nephrotoxic agents, and good blood pressure control. Already limits salt in diet. Update vit D, Umicroalbumin       Relevant Orders   VITAMIN D 25 Hydroxy (Vit-D Deficiency, Fractures)   Renal function panel   Microalbumin / creatinine urine ratio   OSA (obstructive sleep apnea)    Continue CPAP.          No orders of the defined types were placed in this encounter.  Orders Placed This Encounter  Procedures   VITAMIN D 25 Hydroxy (Vit-D Deficiency, Fractures)   Renal function panel  Microalbumin /  creatinine urine ratio     Patient Instructions  Call [facility] to schedule colonoscopy.  Urine test and labs today  We will continue to watch kidney function. Ensure good water intake, avoid ibuprofen/aleve/advil type medicines  Good to meet you today Return in 4 months for follow up visit.    Follow up plan: Return in about 4 months (around 03/20/2021), or if symptoms worsen or fail to improve, for follow up visit.  Ria Bush, MD

## 2020-11-18 NOTE — Patient Instructions (Addendum)
Call [facility] to schedule colonoscopy.  Urine test and labs today  We will continue to watch kidney function. Ensure good water intake, avoid ibuprofen/aleve/advil type medicines  Good to meet you today Return in 4 months for follow up visit.

## 2020-11-21 ENCOUNTER — Encounter: Payer: Self-pay | Admitting: Family Medicine

## 2020-11-22 ENCOUNTER — Encounter: Payer: Self-pay | Admitting: Family Medicine

## 2020-11-22 DIAGNOSIS — N281 Cyst of kidney, acquired: Secondary | ICD-10-CM

## 2020-11-22 DIAGNOSIS — N2889 Other specified disorders of kidney and ureter: Secondary | ICD-10-CM

## 2020-11-22 DIAGNOSIS — N1832 Chronic kidney disease, stage 3b: Secondary | ICD-10-CM

## 2020-11-28 MED ORDER — BENAZEPRIL HCL 10 MG PO TABS: 10.0000 mg | ORAL_TABLET | Freq: Every day | ORAL | 3 refills | Status: DC

## 2020-12-12 ENCOUNTER — Ambulatory Visit: Payer: Medicare HMO

## 2020-12-19 ENCOUNTER — Other Ambulatory Visit: Payer: Self-pay

## 2020-12-19 ENCOUNTER — Ambulatory Visit
Admission: RE | Admit: 2020-12-19 | Discharge: 2020-12-19 | Disposition: A | Discharge status: Home / Self Care | Payer: Medicare HMO | Source: Ambulatory Visit | Attending: Family Medicine | Admitting: Family Medicine

## 2020-12-19 DIAGNOSIS — N189 Chronic kidney disease, unspecified: Secondary | ICD-10-CM | POA: Diagnosis not present

## 2020-12-19 DIAGNOSIS — N1832 Chronic kidney disease, stage 3b: Secondary | ICD-10-CM | POA: Insufficient documentation

## 2020-12-19 DIAGNOSIS — N2889 Other specified disorders of kidney and ureter: Secondary | ICD-10-CM | POA: Diagnosis not present

## 2020-12-19 DIAGNOSIS — N281 Cyst of kidney, acquired: Secondary | ICD-10-CM | POA: Diagnosis not present

## 2020-12-20 MED ORDER — LOSARTAN POTASSIUM 50 MG PO TABS: 50.0000 mg | ORAL_TABLET | Freq: Every day | ORAL | 6 refills | Status: DC

## 2020-12-20 NOTE — Addendum Note (Signed)
Addended by: Ria Bush on: 12/20/2020 03:39 PM   Modules accepted: Orders

## 2020-12-26 NOTE — Telephone Encounter (Signed)
CT ordered for abnormal R renal lesion on [place]

## 2020-12-26 NOTE — Addendum Note (Signed)
Addended by: Ria Bush on: 12/26/2020 08:42 PM   Modules accepted: Orders

## 2021-01-01 IMAGING — US US THYROID
1 series · 13 of 25 positions shown · non-contrast
Comparison: 12/01/2016, 09/27/2015 and 09/17/2015.

CLINICAL DATA: Prior ultrasound follow-up. History of thyroid
goiter with bilateral nodules, some of which have been determined to
represent pseudo nodules.

EXAM:
THYROID ULTRASOUND
TECHNIQUE: Ultrasound examination of the thyroid gland and adjacent soft
tissues was performed.

[Series 1: us thyroid · 0.04mm/px · 13 of 69 slices shown]
[im 1/69]
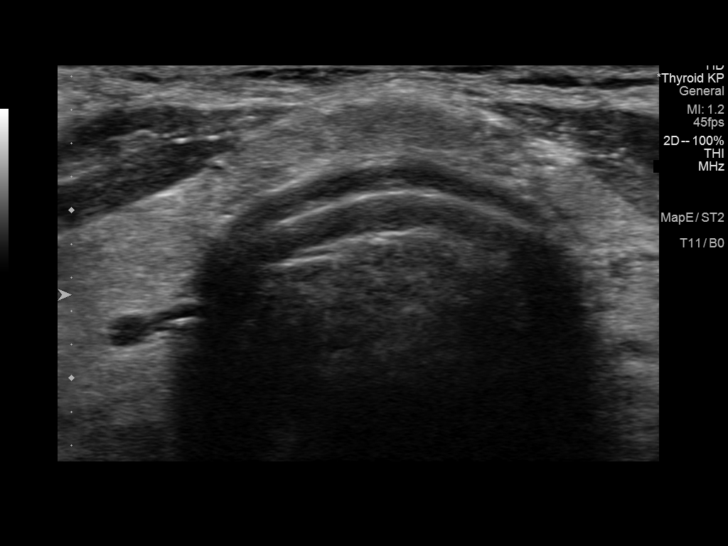
[im 6/69]
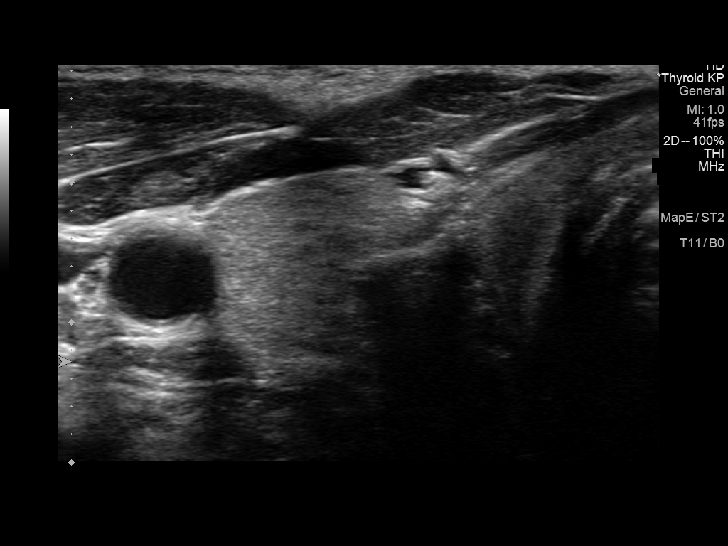
[im 12/69]
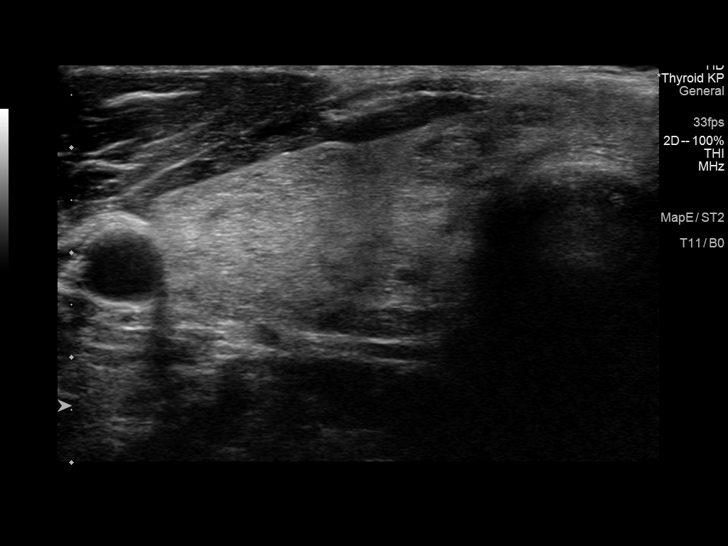
[im 18/69]
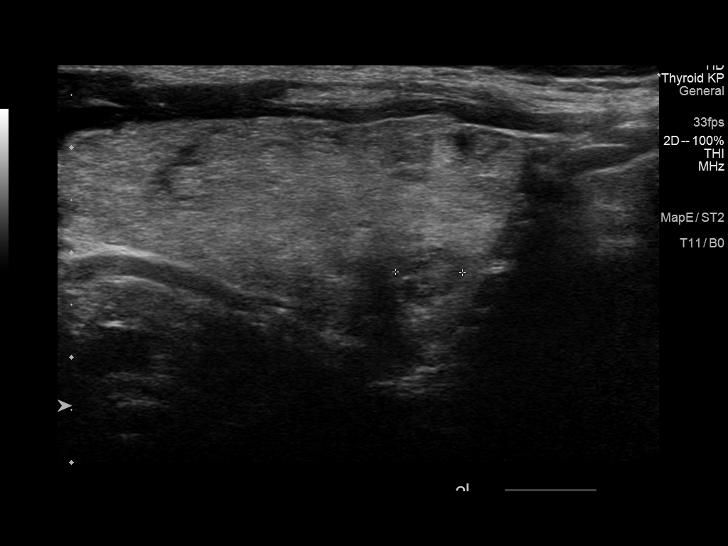
[im 23/69]
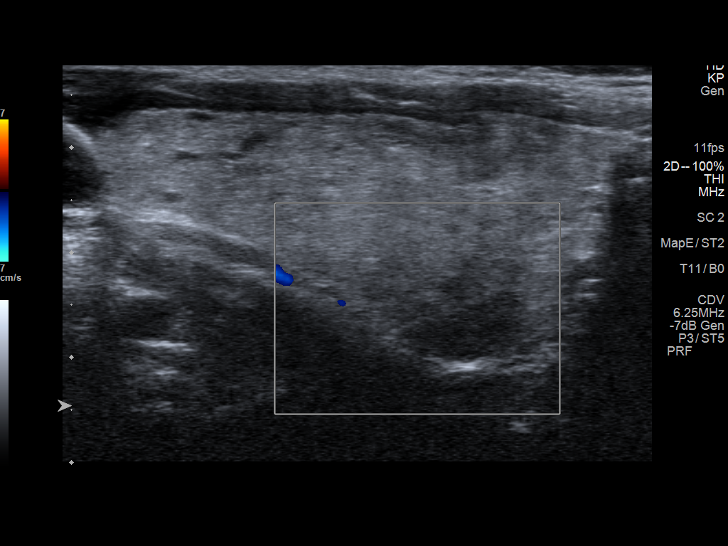
[im 29/69]
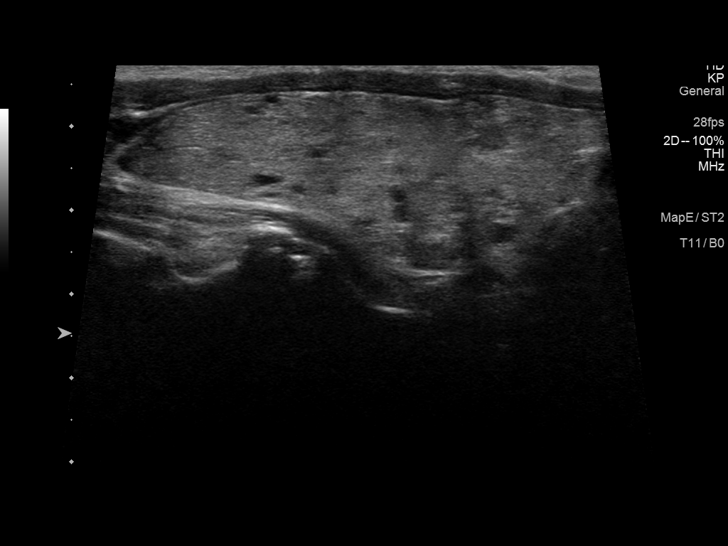
[im 35/69]
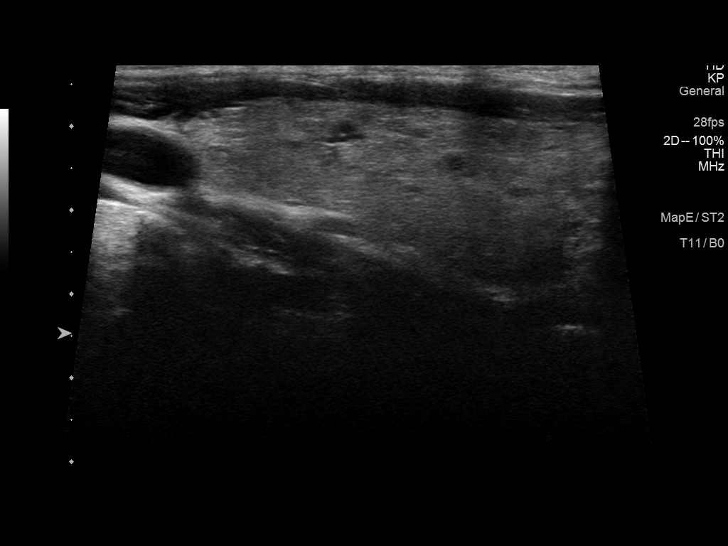
[im 40/69]
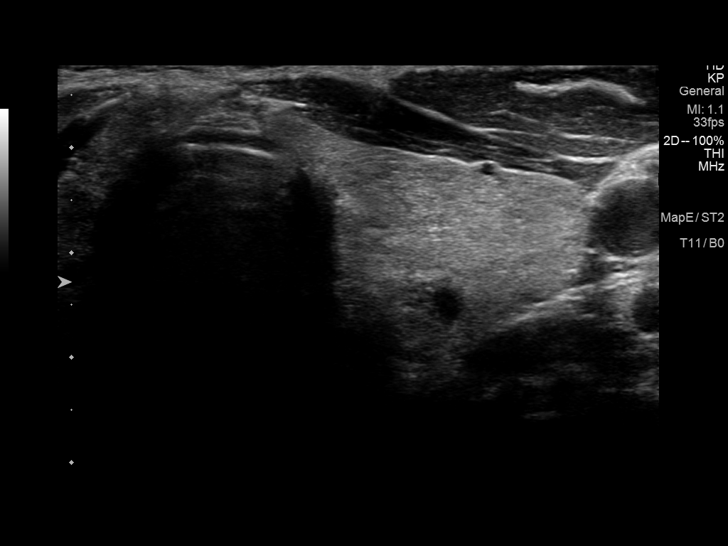
[im 46/69]
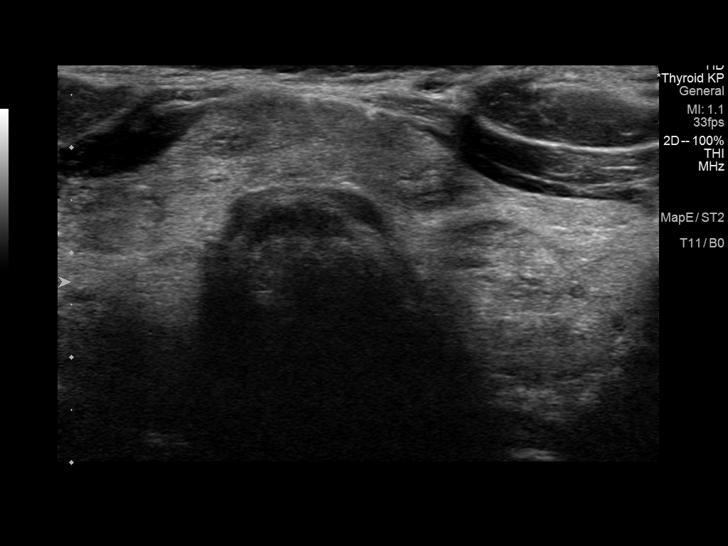
[im 52/69]
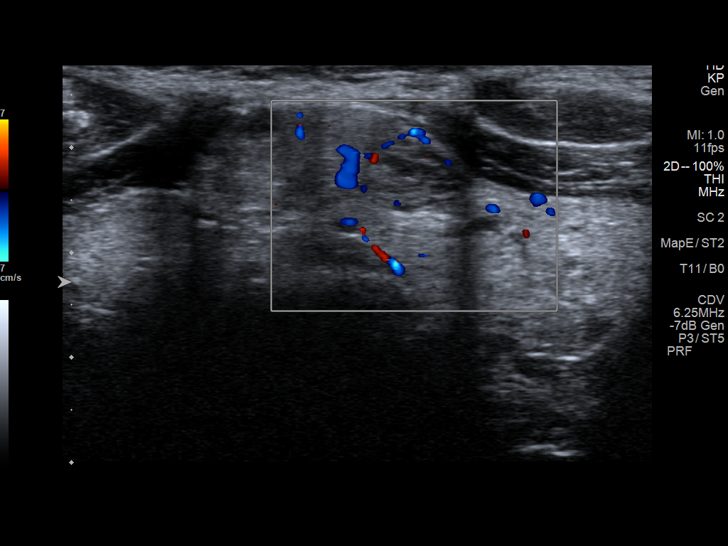
[im 57/69]
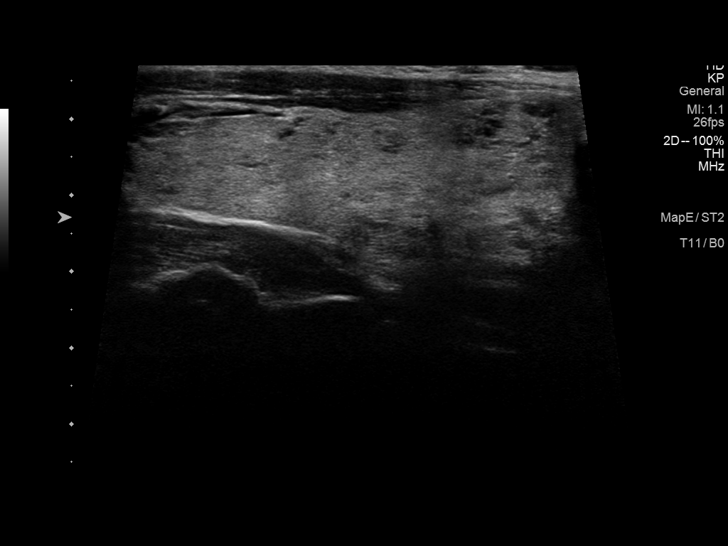
[im 63/69]
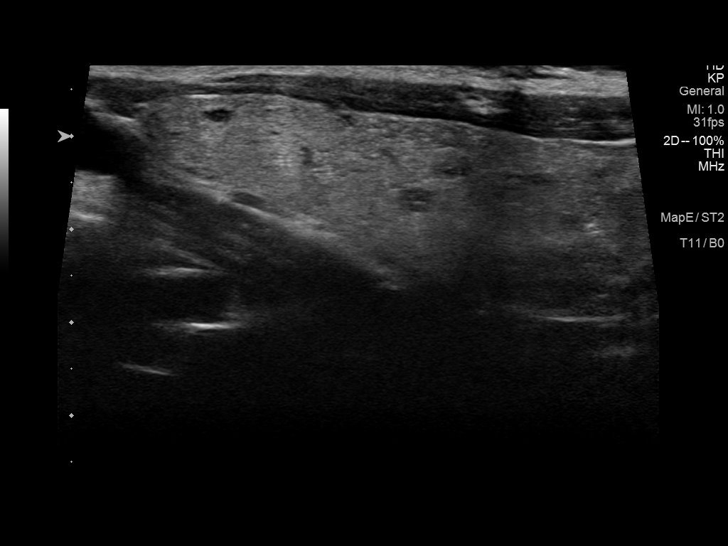
[im 69/69]
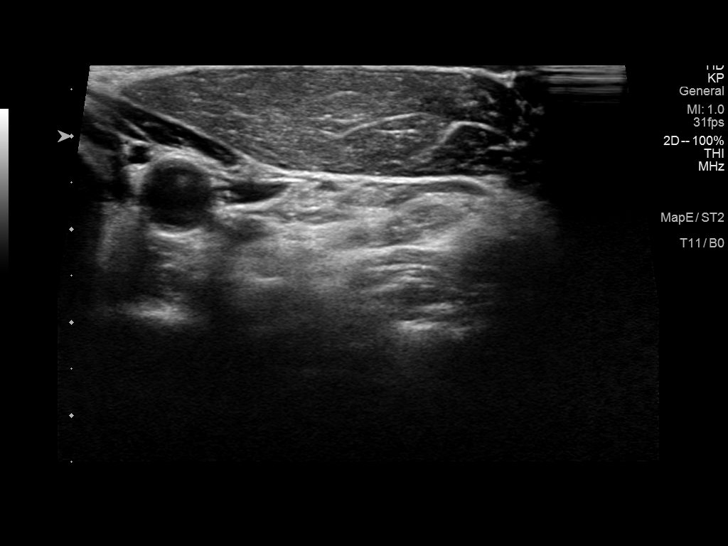

[13 of 25 positions shown; findings below may reference images not displayed]

FINDINGS: Parenchymal Echotexture: Moderately heterogenous

Isthmus: 0.8 cm

Right lobe: 5.8 x 2.1 x 2.4 cm

Left lobe: 5.9 x 2.1 x 2.5 cm

_________________________________________________________

Estimated total number of nodules >/= 1 cm: 1

Number of spongiform nodules >/=  2 cm not described below (TR1): 0

Number of mixed cystic and solid nodules >/= 1.5 cm not described
below (TR2): 0

_________________________________________________________

Nodule # 1:

Prior biopsy: No

Location: Left; Inferior

Maximum size: 1.3 cm; Other 2 dimensions: 1.0 x 0.7 cm, previously,
1.4 cm

Composition: spongiform (0)

Echogenicity: anechoic (0)

Shape: not taller-than-wide (0)

Margins: smooth (0)

Echogenic foci: none (0)

ACR TI-RADS total points: 0.

ACR TI-RADS risk category:  TR1 (0-1 points).

Significant change in size (>/= 20% in two dimensions and minimal
increase of 2 mm): No

Change in features: Yes

Change in ACR TI-RADS risk category: Yes

ACR TI-RADS recommendations:

This nodule does NOT meet TI-RADS criteria for biopsy or dedicated
follow-up. The nodule now appears clearly more spongiform and benign
in appearance.

_________________________________________________________

Previously noted 1.1 cm right mid thyroid nodule is no longer
reproducible. 0.8 cm nodule in the inferior right lobe does not meet
criteria for biopsy or follow-up.
IMPRESSION: The left inferior thyroid nodule previously measuring 1.4 cm shows
stable size of 1.3 cm but now appears clearly more spongiform in
nature and therefore is downgraded by classification into a nodule
not requiring dedicated follow-up. The previously noted 1.1 cm right
mid thyroid nodule is no longer reproducible or visualized by
ultrasound.

The above is in keeping with the ACR TI-RADS recommendations - [HOSPITAL] 1902;[DATE].

## 2021-01-09 NOTE — Telephone Encounter (Signed)
Being followed  CT is pending authorization with [employer]

## 2021-01-17 NOTE — Telephone Encounter (Signed)
Authorization Number: [id number] Case Number: [id number]                 Status: Approved          Approval Date:            01/06/2021 12:00:00 AM Service Code: 79810 Service Description:    CT ABDOMEN W/O & W/ CONTRAST Site Name:      [facility] Expiration Date:          07/05/2021   Pt has been given the scheduling depts # per referral message

## 2021-01-21 ENCOUNTER — Other Ambulatory Visit (INDEPENDENT_AMBULATORY_CARE_PROVIDER_SITE_OTHER): Payer: Medicare HMO

## 2021-01-21 ENCOUNTER — Encounter: Payer: Self-pay | Admitting: Gastroenterology

## 2021-01-21 ENCOUNTER — Other Ambulatory Visit: Payer: Self-pay

## 2021-01-21 DIAGNOSIS — N1832 Chronic kidney disease, stage 3b: Secondary | ICD-10-CM

## 2021-01-21 NOTE — Progress Notes (Unsigned)
Perthe orders of Dr.Gutierrez pt is here for labs pt tolerated draw well.

## 2021-01-22 LAB — BASIC METABOLIC PANEL
BUN: 13 mg/dL (ref 6–23)
CO2: 29 mEq/L (ref 19–32)
Calcium: 9.5 mg/dL (ref 8.4–10.5)
Chloride: 104 mEq/L (ref 96–112)
Creatinine, Ser: 1.52 mg/dL — ABNORMAL HIGH (ref 0.40–1.50)
GFR: 44.93 mL/min — ABNORMAL LOW (ref >60.00)
Glucose, Bld: 84 mg/dL (ref 70–99)
Potassium: 4.5 mEq/L (ref 3.5–5.1)
Sodium: 138 mEq/L (ref 135–145)

## 2021-02-05 ENCOUNTER — Ambulatory Visit
Admission: RE | Admit: 2021-02-05 | Discharge: 2021-02-05 | Disposition: A | Discharge status: Home / Self Care | Payer: Medicare HMO | Source: Ambulatory Visit | Attending: Family Medicine | Admitting: Family Medicine

## 2021-02-05 ENCOUNTER — Other Ambulatory Visit: Payer: Self-pay

## 2021-02-05 DIAGNOSIS — N281 Cyst of kidney, acquired: Secondary | ICD-10-CM | POA: Diagnosis not present

## 2021-02-05 DIAGNOSIS — N2889 Other specified disorders of kidney and ureter: Secondary | ICD-10-CM | POA: Insufficient documentation

## 2021-02-05 MED ORDER — IOHEXOL 300 MG/ML  SOLN
80.0000 mL | Freq: Once | INTRAMUSCULAR | Status: AC | PRN
  Administered 2021-02-05: 80 mL via INTRAVENOUS

## 2021-02-07 ENCOUNTER — Encounter: Payer: Self-pay | Admitting: Family Medicine

## 2021-02-07 DIAGNOSIS — N281 Cyst of kidney, acquired: Secondary | ICD-10-CM | POA: Insufficient documentation

## 2021-02-28 ENCOUNTER — Other Ambulatory Visit: Payer: Self-pay | Admitting: Family Medicine

## 2021-03-03 NOTE — Telephone Encounter (Signed)
Change in therapy.  Benazepril d/c, started on losartan 50 mg daily in it's place.  See Pt Msg, 12/20/20.

## 2021-03-17 ENCOUNTER — Ambulatory Visit: Payer: Medicare HMO | Admitting: Family Medicine

## 2021-03-17 DIAGNOSIS — G4733 Obstructive sleep apnea (adult) (pediatric): Secondary | ICD-10-CM | POA: Diagnosis not present

## 2021-03-18 NOTE — Progress Notes (Signed)
Andrew Soohoo T. Claxton Levitz, MD, [place] [facility] [place], [address]  Phone: [phone]  FAX: [place] - 75 y.o. male  MRN [id number]  Date of Birth: [birth year]  Date: 03/19/2021  PCP: Andrew Bush, MD  Referral: Andrew Bush, MD  Chief Complaint  Patient presents with   Hand Pain    Right Thumb    This visit occurred during the SARS-CoV-2 public health emergency.  Safety protocols were in place, including screening questions prior to the visit, additional usage of staff PPE, and extensive cleaning of exam room while observing appropriate contact time as indicated for disinfecting solutions.   Subjective:   [name] is a 75 y.o. very pleasant male patient with Body mass index is 28.87 kg/m. who presents with the following:  [name] is here for right thumb pain.  He flared it up over the summer, now he has some pain sometimes with intermittent movements of the hand such as when doing yard work.  Right now, he does not have any active pain.  He has not had any kind of trauma at all.  Does have a history of a significant right sided MCP fracture in high school that required surgery.  He points to the basal joint.  Review of Systems is noted in the HPI, as appropriate  Objective:   BP 112/70   Pulse 62   Temp 98.1 F (36.7 C) (Temporal)   Ht 5' 10.5" (1.791 m)   Wt 204 lb 2 oz (92.6 kg)   SpO2 98%   BMI 28.87 kg/m   GEN: No acute distress; alert,appropriate. PULM: Breathing comfortably in no respiratory distress PSYCH: Normally interactive.   Right-sided all digits are normal without pain.  Full range of motion at the wrist.  Nontender at the thumb IP joint as well as MCP joint.  Ulnar collateral as well as radial collateral ligaments are intact.  Finkelstein's is negative and the first dorsal compartment is nontender and without swelling.  He does have focal  tenderness at the basal joint.  Laboratory and Imaging Data:  Assessment and Plan:     ICD-10-CM   1. 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  This is likely going to bother him forever intermittently.  Right now, I think using some Voltaren gel if it is bothering him would be appropriate step, and then some oral NSAIDs or Tylenol is also okay.  I showed him some basal joint braces, but at this point it is so intermittent that I do not think he needs to do them at all.  If he is having ongoing daily pain, then we can always do a basal joint injection, but at this point I think that is definitely overkill.  Patient Instructions  Voltaren 1% gel, over the counter You can apply up to 4 times a day  This can be applied to any joint: knee, wrist, fingers, elbows, shoulders, feet and ankles. Can apply to any tendon: tennis elbow, achilles, tendon, rotator cuff or any other tendon.  Minimal is absorbed in the bloodstream: ok with oral anti-inflammatory or a blood thinner.  Cost is about 9 dollars    Follow-up: As needed  Dragon [employer] One speech-to-text software was used for transcription in this dictation.  Possible transcriptional errors can occur using Editor, commissioning.   Signed,  Maud Deed. [name], MD  Outpatient Encounter Medications as of 03/19/2021  Medication Sig   acetaminophen (TYLENOL) 500 MG tablet Take 500 mg by mouth every 6 (six) hours as needed for moderate pain.   cholecalciferol (VITAMIN D) 1000 UNITS tablet Take 1,000 Units by mouth daily. Patient takes 3 times a week   losartan (COZAAR) 50 MG tablet Take 1 tablet (50 mg total) by mouth daily.   [DISCONTINUED] amLODipine (NORVASC) 10 MG tablet Take 1 tablet (10 mg total) by mouth daily.   No facility-administered encounter medications on file as of 03/19/2021.

## 2021-03-19 ENCOUNTER — Ambulatory Visit (INDEPENDENT_AMBULATORY_CARE_PROVIDER_SITE_OTHER): Payer: Medicare HMO | Admitting: Family Medicine

## 2021-03-19 ENCOUNTER — Other Ambulatory Visit: Payer: Self-pay

## 2021-03-19 ENCOUNTER — Encounter: Payer: Self-pay | Admitting: Family Medicine

## 2021-03-19 VITALS — BP 112/70 | HR 62 | Temp 98.1°F | Ht 70.5 in | Wt 204.1 lb

## 2021-03-19 DIAGNOSIS — M1810 Unilateral primary osteoarthritis of first carpometacarpal joint, unspecified hand: Secondary | ICD-10-CM

## 2021-03-19 NOTE — Patient Instructions (Signed)
Voltaren 1% gel, over the counter ?You can apply up to 4 times a day ? ?This can be applied to any joint: knee, wrist, fingers, elbows, shoulders, feet and ankles. ?Can apply to any tendon: tennis elbow, achilles, tendon, rotator cuff or any other tendon. ? ?Minimal is absorbed in the bloodstream: ok with oral anti-inflammatory or a blood thinner. ? ?Cost is about 9 dollars  ?

## 2021-03-24 ENCOUNTER — Encounter: Payer: Self-pay | Admitting: Gastroenterology

## 2021-03-24 ENCOUNTER — Encounter: Payer: Self-pay | Admitting: Family Medicine

## 2021-03-24 ENCOUNTER — Ambulatory Visit (INDEPENDENT_AMBULATORY_CARE_PROVIDER_SITE_OTHER): Payer: Medicare HMO | Admitting: Family Medicine

## 2021-03-24 ENCOUNTER — Other Ambulatory Visit: Payer: Self-pay

## 2021-03-24 VITALS — BP 120/76 | HR 68 | Temp 98.2°F | Ht 70.5 in | Wt 204.2 lb

## 2021-03-24 DIAGNOSIS — I10 Essential (primary) hypertension: Secondary | ICD-10-CM

## 2021-03-24 DIAGNOSIS — N281 Cyst of kidney, acquired: Secondary | ICD-10-CM | POA: Diagnosis not present

## 2021-03-24 DIAGNOSIS — D1771 Benign lipomatous neoplasm of kidney: Secondary | ICD-10-CM | POA: Diagnosis not present

## 2021-03-24 DIAGNOSIS — M41124 Adolescent idiopathic scoliosis, thoracic region: Secondary | ICD-10-CM

## 2021-03-24 DIAGNOSIS — N1832 Chronic kidney disease, stage 3b: Secondary | ICD-10-CM

## 2021-03-24 DIAGNOSIS — I4510 Unspecified right bundle-branch block: Secondary | ICD-10-CM | POA: Insufficient documentation

## 2021-03-24 DIAGNOSIS — M419 Scoliosis, unspecified: Secondary | ICD-10-CM | POA: Insufficient documentation

## 2021-03-24 LAB — RENAL FUNCTION PANEL
Albumin: 4.2 g/dL (ref 3.5–5.2)
BUN: 19 mg/dL (ref 6–23)
CO2: 26 mEq/L (ref 19–32)
Calcium: 9.5 mg/dL (ref 8.4–10.5)
Chloride: 106 mEq/L (ref 96–112)
Creatinine, Ser: 1.53 mg/dL — ABNORMAL HIGH (ref 0.40–1.50)
GFR: 44.52 mL/min — ABNORMAL LOW (ref >60.00)
Glucose, Bld: 78 mg/dL (ref 70–99)
Phosphorus: 2.8 mg/dL (ref 2.3–4.6)
Potassium: 4.4 mEq/L (ref 3.5–5.1)
Sodium: 139 mEq/L (ref 135–145)

## 2021-03-24 NOTE — Assessment & Plan Note (Signed)
Reviewed recent kidney imaging.

## 2021-03-24 NOTE — Patient Instructions (Addendum)
EKG today  Labs today  Call GI to schedule colonoscopy [phone] (Dr Loletha Carrow) Blood pressures are looking good - continue losartan.  Return in 4 months for physical/wellness visit.   DASH Eating Plan DASH stands for Dietary Approaches to Stop Hypertension. The DASH eating plan is a healthy eating plan that has been shown to: Reduce high blood pressure (hypertension). Reduce your risk for type 2 diabetes, heart disease, and stroke. Help with weight loss. What are tips for following this plan? Reading food labels Check food labels for the amount of salt (sodium) per serving. Choose foods with less than 5 percent of the Daily Value of sodium. Generally, foods with less than 300 milligrams (mg) of sodium per serving fit into this eating plan. To find whole grains, look for the word "whole" as the first word in the ingredient list. Shopping Buy products labeled as "low-sodium" or "no salt added." Buy fresh foods. Avoid canned foods and pre-made or frozen meals. Cooking Avoid adding salt when cooking. Use salt-free seasonings or herbs instead of table salt or sea salt. Check with your health care provider or pharmacist before using salt substitutes. Do not fry foods. Cook foods using healthy methods such as baking, boiling, grilling, roasting, and broiling instead. Cook with heart-healthy oils, such as olive, canola, avocado, soybean, or sunflower oil. Meal planning  Eat a balanced diet that includes: 4 or more servings of fruits and 4 or more servings of vegetables each day. Try to fill one-half of your plate with fruits and vegetables. 6-8 servings of whole grains each day. Less than 6 oz (170 g) of lean meat, poultry, or fish each day. A 3-oz (85-g) serving of meat is about the same size as a deck of cards. One egg equals 1 oz (28 g). 2-3 servings of low-fat dairy each day. One serving is 1 cup (237 mL). 1 serving of nuts, seeds, or beans 5 times each week. 2-3 servings of heart-healthy  fats. Healthy fats called omega-3 fatty acids are found in foods such as walnuts, flaxseeds, fortified milks, and eggs. These fats are also found in cold-water fish, such as sardines, salmon, and mackerel. Limit how much you eat of: Canned or prepackaged foods. Food that is high in trans fat, such as some fried foods. Food that is high in saturated fat, such as fatty meat. Desserts and other sweets, sugary drinks, and other foods with added sugar. Full-fat dairy products. Do not salt foods before eating. Do not eat more than 4 egg yolks a week. Try to eat at least 2 vegetarian meals a week. Eat more home-cooked food and less restaurant, buffet, and fast food. Lifestyle When eating at a restaurant, ask that your food be prepared with less salt or no salt, if possible. If you drink alcohol: Limit how much you use to: 0-1 drink a day for women who are not pregnant. 0-2 drinks a day for men. Be aware of how much alcohol is in your drink. In the [place] one drink equals one 12 oz bottle of beer (355 mL), one 5 oz glass of wine (148 mL), or one 1 oz glass of hard liquor (44 mL). General information Avoid eating more than 2,300 mg of salt a day. If you have hypertension, you may need to reduce your sodium intake to 1,500 mg a day. Work with your health care provider to maintain a healthy body weight or to lose weight. Ask what an ideal weight is for you. Get at least 30  minutes of exercise that causes your heart to beat faster (aerobic exercise) most days of the week. Activities may include walking, swimming, or biking. Work with your health care provider or dietitian to adjust your eating plan to your individual calorie needs. What foods should I eat? Fruits All fresh, dried, or frozen fruit. Canned fruit in natural juice (without added sugar). Vegetables Fresh or frozen vegetables (raw, steamed, roasted, or grilled). Low-sodium or reduced-sodium tomato and vegetable juice. Low-sodium or  reduced-sodium tomato sauce and tomato paste. Low-sodium or reduced-sodium canned vegetables. Grains Whole-grain or whole-wheat bread. Whole-grain or whole-wheat pasta. Brown rice. [place]. Bulgur. Whole-grain and low-sodium cereals. Pita bread. Low-fat, low-sodium crackers. Whole-wheat flour tortillas. Meats and other proteins Skinless chicken or [place]. Ground chicken or [place]. Pork with fat trimmed off. Fish and seafood. Egg whites. Dried beans, peas, or lentils. Unsalted nuts, nut butters, and seeds. Unsalted canned beans. Lean cuts of beef with fat trimmed off. Low-sodium, lean precooked or cured meat, such as sausages or meat loaves. Dairy Low-fat (1%) or fat-free (skim) milk. Reduced-fat, low-fat, or fat-free cheeses. Nonfat, low-sodium ricotta or cottage cheese. Low-fat or nonfat yogurt. Low-fat, low-sodium cheese. Fats and oils Soft margarine without trans fats. Vegetable oil. Reduced-fat, low-fat, or light mayonnaise and salad dressings (reduced-sodium). Canola, safflower, olive, avocado, soybean, and sunflower oils. Avocado. Seasonings and condiments Herbs. Spices. Seasoning mixes without salt. Other foods Unsalted popcorn and pretzels. Fat-free sweets. The items listed above may not be a complete list of foods and beverages you can eat. Contact a dietitian for more information. What foods should I avoid? Fruits Canned fruit in a light or heavy syrup. Fried fruit. Fruit in cream or butter sauce. Vegetables Creamed or fried vegetables. Vegetables in a cheese sauce. Regular canned vegetables (not low-sodium or reduced-sodium). Regular canned tomato sauce and paste (not low-sodium or reduced-sodium). Regular tomato and vegetable juice (not low-sodium or reduced-sodium). Angie Fava. Olives. Grains Baked goods made with fat, such as croissants, muffins, or some breads. Dry pasta or rice meal packs. Meats and other proteins Fatty cuts of meat. Ribs. Fried meat. Berniece Salines. Bologna,  salami, and other precooked or cured meats, such as sausages or meat loaves. Fat from the back of a pig (fatback). Bratwurst. Salted nuts and seeds. Canned beans with added salt. Canned or smoked fish. Whole eggs or egg yolks. Chicken or [place] with skin. Dairy Whole or 2% milk, cream, and half-and-half. Whole or full-fat cream cheese. Whole-fat or sweetened yogurt. Full-fat cheese. Nondairy creamers. Whipped toppings. Processed cheese and cheese spreads. Fats and oils Butter. Stick margarine. Lard. Shortening. Ghee. Bacon fat. Tropical oils, such as coconut, palm kernel, or palm oil. Seasonings and condiments Onion salt, garlic salt, seasoned salt, table salt, and sea salt. Worcestershire sauce. Tartar sauce. Barbecue sauce. Teriyaki sauce. Soy sauce, including reduced-sodium. Steak sauce. Canned and packaged gravies. Fish sauce. Oyster sauce. Cocktail sauce. Store-bought horseradish. Ketchup. Mustard. Meat flavorings and tenderizers. Bouillon cubes. Hot sauces. Pre-made or packaged marinades. Pre-made or packaged taco seasonings. Relishes. Regular salad dressings. Other foods Salted popcorn and pretzels. The items listed above may not be a complete list of foods and beverages you should avoid. Contact a dietitian for more information. Where to find more information National Heart, Lung, and Blood Institute: https://wilson-eaton.com/ American Heart Association: www.heart.org Academy of Nutrition and Dietetics: www.eatright.[place]: www.kidney.org Summary The DASH eating plan is a healthy eating plan that has been shown to reduce high blood pressure (hypertension). It may also reduce your risk for  type 2 diabetes, heart disease, and stroke. When on the DASH eating plan, aim to eat more fresh fruits and vegetables, whole grains, lean proteins, low-fat dairy, and heart-healthy fats. With the DASH eating plan, you should limit salt (sodium) intake to 2,300 mg a day. If you have  hypertension, you may need to reduce your sodium intake to 1,500 mg a day. Work with your health care provider or dietitian to adjust your eating plan to your individual calorie needs. This information is not intended to replace advice given to you by your health care provider. Make sure you discuss any questions you have with your health care provider. Document Revised: 03/03/2019 Document Reviewed: 03/03/2019 Elsevier Patient Education  2022 [employer].

## 2021-03-24 NOTE — Assessment & Plan Note (Addendum)
Chronic, non-microalbuminuric, present over the past year.  Tolerating losartan 50mg  daily - continue. Update labs today.

## 2021-03-24 NOTE — Assessment & Plan Note (Signed)
The current medical regimen is effective;  continue present plan and medications (losartan 50mg  daily). Reviewed low sodium/salt diet and regular aerobic exercise routine. DASH diet handout provided.

## 2021-03-24 NOTE — Assessment & Plan Note (Signed)
Reviewed recent reassuring imaging.

## 2021-03-24 NOTE — Progress Notes (Signed)
Patient ID: [name], male    DOB: 1945-07-12, 75 y.o.   MRN: [id number]  This visit was conducted in person.  BP 120/76 (BP Location: Right Arm, Cuff Size: Large)   Pulse 68   Temp 98.2 F (36.8 C) (Temporal)   Ht 5' 10.5" (1.791 m)   Wt 204 lb 4 oz (92.6 kg)   SpO2 96%   BMI 28.89 kg/m    CC: 4 mo f/u visit  Subjective:   HPI: [name] is a 75 y.o. male presenting on 03/24/2021 for Follow-up (Here for 4 wik kidney f/u and labs. )   HTN - Compliant with current antihypertensive regimen of losartan 50mg  daily.  Does check blood pressures at home: last week 112/72. No low blood pressure readings or symptoms of dizziness/syncope. Denies HA, vision changes, CP/tightness, SOB, leg swelling.   Walks regularly 3-4 days/wk (3 miles at a time).   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      Relevant past medical, surgical, family and social history reviewed and updated as indicated. Interim medical history since our last visit reviewed. Allergies and medications reviewed and updated. Outpatient Medications Prior to Visit  Medication Sig Dispense Refill   acetaminophen (TYLENOL) 500 MG tablet Take 500 mg by mouth every 6 (six) hours as needed for moderate pain.     cholecalciferol (VITAMIN D) 1000 UNITS tablet Take 1,000 Units by mouth daily. Patient takes 3 times a week     losartan (COZAAR) 50 MG tablet Take 1 tablet (50 mg total) by mouth daily. 30 tablet 6   No facility-administered medications prior to visit.     Per HPI unless specifically indicated in ROS section below Review of Systems  Objective:  BP 120/76 (BP Location: Right Arm, Cuff Size: Large)   Pulse 68   Temp 98.2 F (36.8 C) (Temporal)   Ht 5' 10.5" (1.791 m)   Wt 204 lb 4 oz (92.6 kg)   SpO2 96%   BMI 28.89 kg/m   Wt Readings from Last 3  Encounters:  03/24/21 204 lb 4 oz (92.6 kg)  03/19/21 204 lb 2 oz (92.6 kg)  11/18/20 198 lb 5 oz (90 kg)      Physical Exam Vitals and nursing note reviewed.  Constitutional:      Appearance: Normal appearance. He is not ill-appearing.  Cardiovascular:     Rate and Rhythm: Normal rate and regular rhythm.     Pulses: Normal pulses.     Heart sounds: Normal heart sounds. No murmur heard. Pulmonary:     Effort: Pulmonary effort is normal. No respiratory distress.     Breath sounds: Normal breath sounds. No wheezing, rhonchi or rales.  Musculoskeletal:     Right lower leg: No edema.     Left lower leg: No edema.     Comments: Evident thoracic dextroscoliosis   Skin:    General: Skin is warm and dry.     Findings: No rash.  Neurological:     Mental Status: He is alert.  Psychiatric:        Mood and Affect: Mood normal.        Behavior: Behavior normal.      Results for orders placed or performed in visit on 93/23/55  Basic metabolic panel  Result Value Ref Range   Sodium 138 135 - 145 mEq/L  Potassium 4.5 3.5 - 5.1 mEq/L   Chloride 104 96 - 112 mEq/L   CO2 29 19 - 32 mEq/L   Glucose, Bld 84 70 - 99 mg/dL   BUN 13 6 - 23 mg/dL   Creatinine, Ser 1.52 (H) 0.40 - 1.50 mg/dL   GFR 44.93 (L) >60.00 mL/min   Calcium 9.5 8.4 - 10.5 mg/dL   EKG - sinus arrhythmia rate 60s, RBBB, LAD with LAFB, no hypertrophy or acute ST/T changes, possible new inverted T waves laterally from prior EKG in chart 2016 but largely unchanged from EKG 2012.   Assessment & Plan:  This visit occurred during the SARS-CoV-2 public health emergency.  Safety protocols were in place, including screening questions prior to the visit, additional usage of staff PPE, and extensive cleaning of exam room while observing appropriate contact time as indicated for disinfecting solutions.   Problem List Items Addressed This Visit     Primary hypertension    The current medical regimen is effective;  continue present  plan and medications (losartan 50mg  daily). Reviewed low sodium/salt diet and regular aerobic exercise routine. DASH diet handout provided.      Relevant Orders   EKG 12-Lead (Completed)   CKD (chronic kidney disease) stage 3, GFR 30-59 ml/min (HCC) - Primary    Chronic, non-microalbuminuric, present over the past year.  Tolerating losartan 50mg  daily - continue. Update labs today.      Relevant Orders   Renal function panel   Parathyroid hormone, intact (no Ca)   Multiple acquired cysts of kidney    Reviewed recent reassuring imaging.       Thoracic scoliosis    Chronic, pt endorses present since childhood.       Renal angiomyolipoma    Reviewed recent kidney imaging.       RBBB    With LAD and LAFB. Overall stable EKGs over the past 10 yrs, pt largely asxs.         No orders of the defined types were placed in this encounter.  Orders Placed This Encounter  Procedures   Renal function panel   Parathyroid hormone, intact (no Ca)   EKG 12-Lead     Patient instructions: EKG today  Labs today  Call GI to schedule colonoscopy [phone] (Dr Loletha Carrow) Blood pressures are looking good - continue losartan.  Return in 4 months for physical.   Follow up plan: Return in about 4 months (around 07/23/2021), or if symptoms worsen or fail to improve, for annual exam, prior fasting for blood work, medicare wellness visit.  Andrew Bush, MD

## 2021-03-24 NOTE — Assessment & Plan Note (Signed)
Chronic, pt endorses present since childhood.

## 2021-03-24 NOTE — Assessment & Plan Note (Signed)
With LAD and LAFB. Overall stable EKGs over the past 10 yrs, pt largely asxs.

## 2021-03-25 LAB — PARATHYROID HORMONE, INTACT (NO CA): PTH: 65 pg/mL (ref 16–77)

## 2021-04-16 ENCOUNTER — Telehealth: Payer: Self-pay | Admitting: *Deleted

## 2021-04-16 NOTE — Telephone Encounter (Signed)
Pt. Called back and rescheduled pre-visit 05/08/21.

## 2021-04-16 NOTE — Telephone Encounter (Signed)
Call placed  for pre-visit,number and message left to return call by 5 pm to reschedule or procedure will be canceled.

## 2021-05-08 ENCOUNTER — Encounter: Payer: Self-pay | Admitting: Gastroenterology

## 2021-05-08 ENCOUNTER — Ambulatory Visit (AMBULATORY_SURGERY_CENTER): Payer: Medicare HMO | Admitting: *Deleted

## 2021-05-08 ENCOUNTER — Other Ambulatory Visit: Payer: Self-pay

## 2021-05-08 VITALS — Ht 70.5 in | Wt 198.0 lb

## 2021-05-08 DIAGNOSIS — Z800 Family history of malignant neoplasm of digestive organs: Secondary | ICD-10-CM

## 2021-05-08 MED ORDER — PEG 3350-KCL-NA BICARB-NACL 420 G PO SOLR: 4000.0000 mL | Freq: Once | ORAL | 0 refills | Status: AC

## 2021-05-08 NOTE — Progress Notes (Signed)
No egg or soy allergy known to patient  No issues known to pt with past sedation with any surgeries or procedures Patient denies ever being told they had issues or difficulty with intubation  No FH of Malignant Hyperthermia Pt is not on diet pills Pt is not on  home 02  Pt is not on blood thinners  Pt denies issues with constipation  No A fib or A flutter  Pt is fully vaccinated  for Covid   Pt given the option in PV today for Golytely prep verses  alternative prep  ( Suprep/Plenvu)-  Pt is aware the Golytely has more volume but is more cost effective and the Suprep/Plenvu is less volume but may cost $60   Pt voiced understanding of this and choose Golytely  Prep.   Due to the COVID-19 pandemic we are asking patients to follow certain guidelines in PV and the [place]   Pt aware of COVID protocols and [facility] guidelines   PV completed over the phone. Pt verified name, DOB, address and insurance during PV today.   Pt encouraged to call with questions or issues.  If pt has My chart, procedure instructions sent via My Chart

## 2021-05-14 HISTORY — PX: COLONOSCOPY: SHX174

## 2021-05-15 ENCOUNTER — Encounter: Payer: Self-pay | Admitting: Gastroenterology

## 2021-05-15 ENCOUNTER — Ambulatory Visit (AMBULATORY_SURGERY_CENTER): Payer: Medicare HMO | Admitting: Gastroenterology

## 2021-05-15 ENCOUNTER — Other Ambulatory Visit: Payer: Self-pay

## 2021-05-15 VITALS — BP 111/82 | HR 56 | Temp 98.4°F | Resp 16 | Ht 70.5 in | Wt 198.0 lb

## 2021-05-15 DIAGNOSIS — Z86010 Personal history of colonic polyps: Secondary | ICD-10-CM

## 2021-05-15 DIAGNOSIS — Z800 Family history of malignant neoplasm of digestive organs: Secondary | ICD-10-CM | POA: Diagnosis not present

## 2021-05-15 DIAGNOSIS — G4733 Obstructive sleep apnea (adult) (pediatric): Secondary | ICD-10-CM | POA: Diagnosis not present

## 2021-05-15 DIAGNOSIS — I10 Essential (primary) hypertension: Secondary | ICD-10-CM | POA: Diagnosis not present

## 2021-05-15 MED ORDER — SODIUM CHLORIDE 0.9 % IV SOLN: 500.0000 mL | Freq: Once | INTRAVENOUS | Status: DC

## 2021-05-15 NOTE — Op Note (Signed)
[facility] Patient Name: Celestine Bougie Procedure Date: 05/15/2021 9:25 AM MRN: [id number] Endoscopist: Mallie Mussel L. Loletha Carrow , MD Age: 76 Referring MD:  Date of Birth: [birth year] Gender: Male Account #: [id number] Procedure:                Colonoscopy Indications:              Screening in patient at increased risk: Colorectal                            cancer in brother 80 or older, Surveillance:                            Personal history of adenomatous polyps on last                            colonoscopy > 5 years ago                           diminutive TA 12/2015                           no polyps 2012 Medicines:                Monitored Anesthesia Care Procedure:                Pre-Anesthesia Assessment:                           - Prior to the procedure, a History and Physical                            was performed, and patient medications and                            allergies were reviewed. The patient's tolerance of                            previous anesthesia was also reviewed. The risks                            and benefits of the procedure and the sedation                            options and risks were discussed with the patient.                            All questions were answered, and informed consent                            was obtained. Prior Anticoagulants: The patient has                            taken no previous anticoagulant or antiplatelet  agents. ASA Grade Assessment: II - A patient with                            mild systemic disease. After reviewing the risks                            and benefits, the patient was deemed in                            satisfactory condition to undergo the procedure.                           After obtaining informed consent, the colonoscope                            was passed under direct vision. Throughout the                            procedure, the patient's blood  pressure, pulse, and                            oxygen saturations were monitored continuously. The                            Olympus CF-HQ190L [id number]) Colonoscope was                            introduced through the anus and advanced to the the                            cecum, identified by appendiceal orifice and                            ileocecal valve. The colonoscopy was performed                            without difficulty. The patient tolerated the                            procedure well. The quality of the bowel                            preparation was excellent. The ileocecal valve,                            appendiceal orifice, and rectum were photographed. Scope In: 9:29:15 AM Scope Out: 9:41:51 AM Scope Withdrawal Time: 0 hours 8 minutes 37 seconds  Total Procedure Duration: 0 hours 12 minutes 36 seconds  Findings:                 The perianal and digital rectal examinations were                            normal.  Repeat examination of right colon under NBI                            performed.                           Multiple diverticula were found in the left colon                            and right colon.                           The exam was otherwise without abnormality on                            direct and retroflexion views. Complications:            No immediate complications. Estimated Blood Loss:     Estimated blood loss: none. Impression:               - Diverticulosis in the left colon and in the right                            colon.                           - The examination was otherwise normal on direct                            and retroflexion views.                           - No specimens collected. Recommendation:           - Patient has a contact number available for                            emergencies. The signs and symptoms of potential                            delayed complications were  discussed with the                            patient. Return to normal activities tomorrow.                            Written discharge instructions were provided to the                            patient.                           - Resume previous diet.                           - Continue present medications.                           -  No repeat surveillance colonoscopy recommended                            due to age, current guidelines and low risk polyp                            history. Jaymarion Trombly L. Loletha Carrow, MD 05/15/2021 9:47:17 AM This report has been signed electronically.

## 2021-05-15 NOTE — Progress Notes (Signed)
[facility]  Pt's states no medical or surgical changes since previsit or office visit.  

## 2021-05-15 NOTE — Progress Notes (Signed)
History and Physical:  This patient presents for endoscopic testing for: Encounter Diagnoses  Name Primary?   Family history of malignant neoplasm of gastrointestinal tract Yes   Personal history of colonic polyps     Patient denies chronic abdominal pain, rectal bleeding, constipation or diarrhea. Last colonoscopy sept 2017, diminutive TA, no polyps 2012, brother CRC > age 75  ROS: Patient denies chest pain or cough   Past Medical History: Past Medical History:  Diagnosis Date   Chicken pox    Chronic kidney disease    CKD   ED (erectile dysfunction)    HTN (hypertension)    a. 11/2010 Echo: EF 55-65%.   Sleep apnea    wears cpap     Past Surgical History: Past Surgical History:  Procedure Laterality Date   COLONOSCOPY  2017   KNEE SURGERY Left    1970   SHOULDER SURGERY Left    hx dislocatino with temp  pin age 58  from [place]    Allergies: Allergies  Allergen Reactions   Naproxen Itching    Outpatient Meds: Current Outpatient Medications  Medication Sig Dispense Refill   acetaminophen (TYLENOL) 500 MG tablet Take 500 mg by mouth every 6 (six) hours as needed for moderate pain.     cholecalciferol (VITAMIN D) 1000 UNITS tablet Take 1,000 Units by mouth daily. Patient takes 3 times a week     losartan (COZAAR) 50 MG tablet Take 1 tablet (50 mg total) by mouth daily. 30 tablet 6   Current Facility-Administered Medications  Medication Dose Route Frequency Provider Last Rate Last Admin   0.9 %  sodium chloride infusion  500 mL Intravenous Once Doran Stabler, MD          ___________________________________________________________________ Objective   Exam:  BP (!) 141/83    Pulse (!) 49    Temp 98.4 F (36.9 C)    Resp 12    Ht 5' 10.5" (1.791 m)    Wt 198 lb (89.8 kg)    SpO2 99%    BMI 28.01 kg/m   CV: RRR without murmur, S1/S2 Resp: clear to auscultation bilaterally, normal RR and effort noted GI: soft, no tenderness, with active bowel  sounds.   Assessment: Encounter Diagnoses  Name Primary?   Family history of malignant neoplasm of gastrointestinal tract Yes   Personal history of colonic polyps      Plan: Colonoscopy  The benefits and risks of the planned procedure were described in detail with the patient or (when appropriate) their health care proxy.  Risks were outlined as including, but not limited to, bleeding, infection, perforation, adverse medication reaction leading to cardiac or pulmonary decompensation, pancreatitis (if ERCP).  The limitation of incomplete mucosal visualization was also discussed.  No guarantees or warranties were given.    The patient is appropriate for an endoscopic procedure in the ambulatory setting.   - Wilfrid Lund, MD

## 2021-05-15 NOTE — Patient Instructions (Signed)
Handout given for diverticulosis.  YOU HAD AN ENDOSCOPIC PROCEDURE TODAY AT [facility]:   Refer to the procedure report that was given to you for any specific questions about what was found during the examination.  If the procedure report does not answer your questions, please call your gastroenterologist to clarify.  If you requested that your care partner not be given the details of your procedure findings, then the procedure report has been included in a sealed envelope for you to review at your convenience later.  YOU SHOULD EXPECT: Some feelings of bloating in the abdomen. Passage of more gas than usual.  Walking can help get rid of the air that was put into your GI tract during the procedure and reduce the bloating. If you had a lower endoscopy (such as a colonoscopy or flexible sigmoidoscopy) you may notice spotting of blood in your stool or on the toilet paper. If you underwent a bowel prep for your procedure, you may not have a normal bowel movement for a few days.  Please Note:  You might notice some irritation and congestion in your nose or some drainage.  This is from the oxygen used during your procedure.  There is no need for concern and it should clear up in a day or so.  SYMPTOMS TO REPORT IMMEDIATELY:  Following lower endoscopy (colonoscopy or flexible sigmoidoscopy):  Excessive amounts of blood in the stool  Significant tenderness or worsening of abdominal pains  Swelling of the abdomen that is new, acute  Fever of 100F or higher  For urgent or emergent issues, a gastroenterologist can be reached at any hour by calling [phone]. Do not use MyChart messaging for urgent concerns.    DIET:  We do recommend a small meal at first, but then you may proceed to your regular diet.  Drink plenty of fluids but you should avoid alcoholic beverages for 24 hours.  ACTIVITY:  You should plan to take it easy for the rest of today and you should NOT DRIVE or use heavy  machinery until tomorrow (because of the sedation medicines used during the test).    FOLLOW UP: Our staff will call the number listed on your records 48-72 hours following your procedure to check on you and address any questions or concerns that you may have regarding the information given to you following your procedure. If we do not reach you, we will leave a message.  We will attempt to reach you two times.  During this call, we will ask if you have developed any symptoms of COVID 19. If you develop any symptoms (ie: fever, flu-like symptoms, shortness of breath, cough etc.) before then, please call [phone].  If you test positive for Covid 19 in the 2 weeks post procedure, please call and report this information to [place].    There were no polyps seen today!  You will not need another screening colonoscopy as these tend to stop around age 92.  Please call us at [phone] if you have a change in bowel habits, change in family history of colo-rectal cancer, rectal bleeding or other GI concern before that time.     SIGNATURES/CONFIDENTIALITY: You and/or your care partner have signed paperwork which will be entered into your electronic medical record.  These signatures attest to the fact that that the information above on your After Visit Summary has been reviewed and is understood.  Full responsibility of the confidentiality of this discharge information lies with you and/or  your care-partner.

## 2021-05-15 NOTE — Progress Notes (Signed)
Report given to PACU, vss 

## 2021-05-17 ENCOUNTER — Encounter: Payer: Self-pay | Admitting: Family Medicine

## 2021-05-19 ENCOUNTER — Telehealth: Payer: Self-pay | Admitting: *Deleted

## 2021-05-19 ENCOUNTER — Telehealth: Payer: Self-pay

## 2021-05-19 NOTE — Telephone Encounter (Signed)
Follow up call placed, VM obtained and message left. ?SChaplin, RN,BSN ? ?

## 2021-05-19 NOTE — Telephone Encounter (Signed)
Left message on f/u call 

## 2021-06-30 ENCOUNTER — Other Ambulatory Visit: Payer: Self-pay | Admitting: Family Medicine

## 2021-06-30 NOTE — Telephone Encounter (Signed)
Last apt with PCP 03/24/21. ?Next apt 07/21/21. ?Sent in refill ?

## 2021-07-21 ENCOUNTER — Encounter: Payer: Self-pay | Admitting: Family Medicine

## 2021-07-21 ENCOUNTER — Ambulatory Visit (INDEPENDENT_AMBULATORY_CARE_PROVIDER_SITE_OTHER): Payer: Medicare HMO | Admitting: Family Medicine

## 2021-07-21 VITALS — BP 130/80 | HR 58 | Temp 97.8°F | Ht 70.5 in | Wt 204.0 lb

## 2021-07-21 DIAGNOSIS — N1832 Chronic kidney disease, stage 3b: Secondary | ICD-10-CM

## 2021-07-21 DIAGNOSIS — D1771 Benign lipomatous neoplasm of kidney: Secondary | ICD-10-CM

## 2021-07-21 DIAGNOSIS — Z0000 Encounter for general adult medical examination without abnormal findings: Secondary | ICD-10-CM

## 2021-07-21 DIAGNOSIS — Z125 Encounter for screening for malignant neoplasm of prostate: Secondary | ICD-10-CM

## 2021-07-21 DIAGNOSIS — E042 Nontoxic multinodular goiter: Secondary | ICD-10-CM

## 2021-07-21 DIAGNOSIS — I10 Essential (primary) hypertension: Secondary | ICD-10-CM

## 2021-07-21 DIAGNOSIS — Z7189 Other specified counseling: Secondary | ICD-10-CM

## 2021-07-21 DIAGNOSIS — G4733 Obstructive sleep apnea (adult) (pediatric): Secondary | ICD-10-CM

## 2021-07-21 MED ORDER — LOSARTAN POTASSIUM 50 MG PO TABS: 50.0000 mg | ORAL_TABLET | Freq: Every day | ORAL | 3 refills | Status: DC

## 2021-07-21 NOTE — Assessment & Plan Note (Signed)
Will need to verify he continues nightly CPAP.  ?

## 2021-07-21 NOTE — Assessment & Plan Note (Signed)
Preventative protocols reviewed and updated unless pt declined. Discussed healthy diet and lifestyle.  

## 2021-07-21 NOTE — Assessment & Plan Note (Signed)
Chronic, stable. Continue losartan 50mg daily.  

## 2021-07-21 NOTE — Assessment & Plan Note (Signed)
Stable on latest imaging.  ?

## 2021-07-21 NOTE — Progress Notes (Signed)
? ? Patient ID: [name], male    DOB: 21-Sep-1945, 76 y.o.   MRN: [id number] ? ?This visit was conducted in person. ? ?BP 130/80   Pulse (!) 58   Temp 97.8 ?F (36.6 ?C) (Temporal)   Ht 5' 10.5" (1.791 m)   Wt 204 lb (92.5 kg)   SpO2 98%   BMI 28.86 kg/m?   ? ?CC: AMW/CPE ?Subjective:  ? ?HPI: ?[name] is a 76 y.o. male presenting on 07/21/2021 for Medicare Wellness ? ? ?Did not see health advisor this year.  ?Not fasting today.  ? ?Hearing Screening - Comments:: Wears bilateral hearing aids.  Wearing at today's OV.  ?Vision Screening - Comments:: Last eye exam, 07/2020.  ?[facility] Visit from 07/21/2021 in [facility]  ?PHQ-2 Total Score 1  ? ?  ?  ? ?  07/21/2021  ? 10:43 AM 07/18/2020  ? 10:59 AM 11/26/2016  ?  2:41 PM 09/12/2015  ?  9:35 AM 10/29/2014  ?  8:58 AM  ?Fall Risk   ?Falls in the past year? 0 1 No No No  ?Number falls in past yr:  0     ?Injury with Fall?  0     ? ?Sister with Al[facility] recently passed away  ? ?Preventative: ?COLONOSCOPY 05/2021 - diverticulosis o/w normal ([facility]), no f/u planned ?Prostate cancer screening - yearly, nocturia x1-2  ?Lung cancer screening - not eligible  ?Flu shot -yearly  ?COVID shot [employer] 06/2019, 07/2019, booster 02/2020, bivalent 12/2020 ?Tdap 2011 ?Pneumovax 2014, [id number] 2017 ?Shingrix - 04/2019, 08/2019  ?Advanced directive discussion - does not have at home. Packet provided today. Wife then children would be HCPOA. Full code.  ?Seat belt use discussed ?Sunscreen use discussed. No changing moles on skin. ?Ex smoker quit 1978 ?Alcohol  - none ?Dentist - q6 mo - has had several root canals ?Eye exam - yearly  ?Bowel - no constipation ?Bladder - no incontinence  ? 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 ?Collegiate football player, [employer] ?Lived many years in [place] ?Lives with wife.  ?Occ: retired, was Research officer, trade union ?Diet: good water, fruits/vegetables daily  ?Activity: regular walking  ?   ? ?Relevant past  medical, surgical, family and social history reviewed and updated as indicated. Interim medical history since our last visit reviewed. ?Allergies and medications reviewed and updated. ?Outpatient Medications Prior to Visit  ?Medication Sig Dispense Refill  ? acetaminophen (TYLENOL) 500 MG tablet Take 500 mg by mouth every 6 (six) hours as needed for moderate pain.    ? cholecalciferol (VITAMIN D) 1000 UNITS tablet Take 1,000 Units by mouth daily. Patient takes 3 times a week    ? losartan (COZAAR) 50 MG tablet TAKE 1 TABLET BY MOUTH EVERY DAY 90 tablet 1  ? ?No facility-administered medications prior to visit.  ?  ? ?Per HPI unless specifically indicated in ROS section below ?Review of Systems  ?Constitutional:  Negative for activity change, appetite change, chills, fatigue, fever and unexpected weight change.  ?HENT:  Negative for hearing loss.   ?Eyes:  Negative for visual disturbance.  ?Respiratory:  Negative for cough, chest tightness, shortness of breath and wheezing.   ?Cardiovascular:  Negative for chest pain, palpitations and leg swelling.  ?Gastrointestinal:  Negative for abdominal distention, abdominal pain, blood in stool, constipation, diarrhea, nausea and vomiting.  ?Genitourinary:  Negative for difficulty urinating and hematuria.  ?Musculoskeletal:  Negative for arthralgias, myalgias and neck pain.  ?Skin:  Negative for rash.  ?Neurological:  Negative for dizziness, seizures, syncope and headaches.  ?Hematological:  Negative for adenopathy. Does not bruise/bleed easily.  ?Psychiatric/Behavioral:  Negative for dysphoric mood. The patient is not nervous/anxious.   ? ?Objective:  ?BP 130/80   Pulse (!) 58   Temp 97.8 ?F (36.6 ?C) (Temporal)   Ht 5' 10.5" (1.791 m)   Wt 204 lb (92.5 kg)   SpO2 98%   BMI 28.86 kg/m?   ?Wt Readings from Last 3 Encounters:  ?07/21/21 204 lb (92.5 kg)  ?05/15/21 198 lb (89.8 kg)  ?05/08/21 198 lb (89.8 kg)  ?  ?  ?Physical Exam ?Vitals and nursing note reviewed.   ?Constitutional:   ?   General: He is not in acute distress. ?   Appearance: Normal appearance. He is well-developed. He is not ill-appearing.  ?HENT:  ?   Head: Normocephalic and atraumatic.  ?   Right Ear: Hearing, tympanic membrane, ear canal and external ear normal.  ?   Left Ear: Hearing, tympanic membrane, ear canal and external ear normal.  ?Eyes:  ?   General: No scleral icterus. ?   Extraocular Movements: Extraocular movements intact.  ?   Conjunctiva/sclera: Conjunctivae normal.  ?   Pupils: Pupils are equal, round, and reactive to light.  ?Neck:  ?   Thyroid: No thyroid mass or thyromegaly.  ?   Vascular: No carotid bruit.  ?Cardiovascular:  ?   Rate and Rhythm: Normal rate and regular rhythm.  ?   Pulses: Normal pulses.     ?     Radial pulses are 2+ on the right side and 2+ on the left side.  ?   Heart sounds: Normal heart sounds. No murmur heard. ?Pulmonary:  ?   Effort: Pulmonary effort is normal. No respiratory distress.  ?   Breath sounds: Normal breath sounds. No wheezing, rhonchi or rales.  ?Abdominal:  ?   General: Bowel sounds are normal. There is no distension.  ?   Palpations: Abdomen is soft. There is no mass.  ?   Tenderness: There is no abdominal tenderness. There is no guarding or rebound.  ?   Hernia: No hernia is present.  ?Musculoskeletal:     ?   General: Normal range of motion.  ?   Cervical back: Normal range of motion and neck supple.  ?   Right lower leg: No edema.  ?   Left lower leg: No edema.  ?Lymphadenopathy:  ?   Cervical: No cervical adenopathy.  ?Skin: ?   General: Skin is warm and dry.  ?   Findings: No rash.  ?Neurological:  ?   General: No focal deficit present.  ?   Mental Status: He is alert and oriented to person, place, and time.  ?   Comments:  ?Recall 3/3 ?Calculation 5/5 DLROW  ?Psychiatric:     ?   Mood and Affect: Mood normal.     ?   Behavior: Behavior normal.     ?   Thought Content: Thought content normal.     ?   Judgment: Judgment normal.  ? ?   ?Results  for orders placed or performed in visit on 03/24/21  ?Renal function panel  ?Result Value Ref Range  ? Sodium 139 135 - 145 mEq/L  ? Potassium 4.4 3.5 - 5.1 mEq/L  ? Chloride 106 96 - 112 mEq/L  ? CO2 26 19 - 32 mEq/L  ? Albumin 4.2 3.5 - 5.2 g/dL  ?  BUN 19 6 - 23 mg/dL  ? Creatinine, Ser 1.53 (H) 0.40 - 1.50 mg/dL  ? Glucose, Bld 78 70 - 99 mg/dL  ? Phosphorus 2.8 2.3 - 4.6 mg/dL  ? GFR 44.52 (L) >60.00 mL/min  ? Calcium 9.5 8.4 - 10.5 mg/dL  ?Parathyroid hormone, intact (no Ca)  ?Result Value Ref Range  ? PTH 65 16 - 77 pg/mL  ? ? ?Assessment & Plan:  ? ?Problem List Items Addressed This Visit   ? ? Medicare annual wellness visit, subsequent - Primary (Chronic)  ?  I have personally reviewed the Medicare Annual Wellness questionnaire and have noted ?1. The patient's medical and social history ?2. Their use of alcohol, tobacco or illicit drugs ?3. Their current medications and supplements ?4. The patient's functional ability including ADL's, fall risks, home safety risks and hearing or visual impairment. Cognitive function has been assessed and addressed as indicated.  ?5. Diet and physical activity ?6. Evidence for depression or mood disorders ?The patients weight, height, BMI have been recorded in the chart. ?I have made referrals, counseling and provided education to the patient based on review of the above and I have provided the pt with a written personalized care plan for preventive services. ?Provider list updated.Marland Kitchen ?See scanned questionairre as needed for further documentation. ?Reviewed preventative protocols and updated unless pt declined.  ?  ?  ? Health maintenance examination (Chronic)  ?  Preventative protocols reviewed and updated unless pt declined. ?Discussed healthy diet and lifestyle.  ?  ?  ? Advanced directives, counseling/discussion (Chronic)  ?  Advanced directive discussion - does not have at home. Packet provided today. Wife then children would be HCPOA. Full code.  ?  ?  ? Primary  hypertension  ?  Chronic, stable. Continue losartan '50mg'$  daily.  ?  ?  ? Relevant Medications  ? losartan (COZAAR) 50 MG tablet  ? Other Relevant Orders  ? Lipid panel  ? CKD (chronic kidney disease) stage 3, GFR 30-59 ml/min (HCC)  ?

## 2021-07-21 NOTE — Assessment & Plan Note (Signed)

## 2021-07-21 NOTE — Assessment & Plan Note (Signed)
Thyroid US 2021 reassuring.  ?Update TSH.  ?

## 2021-07-21 NOTE — Patient Instructions (Addendum)
Return at your convenience fasting for blood work.  ?Advanced directive packet provided today.  ?Good to see you today ?Return as needed or in 1 year for next physical/wellness visit.  ? ?Health Maintenance After Age 75 ?After age 15, you are at a higher risk for certain long-term diseases and infections as well as injuries from falls. Falls are a major cause of broken bones and head injuries in people who are older than age 82. Getting regular preventive care can help to keep you healthy and well. Preventive care includes getting regular testing and making lifestyle changes as recommended by your health care provider. Talk with your health care provider about: ?Which screenings and tests you should have. A screening is a test that checks for a disease when you have no symptoms. ?A diet and exercise plan that is right for you. ?What should I know about screenings and tests to prevent falls? ?Screening and testing are the best ways to find a health problem early. Early diagnosis and treatment give you the best chance of managing medical conditions that are common after age 80. Certain conditions and lifestyle choices may make you more likely to have a fall. Your health care provider may recommend: ?Regular vision checks. Poor vision and conditions such as cataracts can make you more likely to have a fall. If you wear glasses, make sure to get your prescription updated if your vision changes. ?Medicine review. Work with your health care provider to regularly review all of the medicines you are taking, including over-the-counter medicines. Ask your health care provider about any side effects that may make you more likely to have a fall. Tell your health care provider if any medicines that you take make you feel dizzy or sleepy. ?Strength and balance checks. Your health care provider may recommend certain tests to check your strength and balance while standing, walking, or changing positions. ?Foot health exam. Foot  pain and numbness, as well as not wearing proper footwear, can make you more likely to have a fall. ?Screenings, including: ?Osteoporosis screening. Osteoporosis is a condition that causes the bones to get weaker and break more easily. ?Blood pressure screening. Blood pressure changes and medicines to control blood pressure can make you feel dizzy. ?Depression screening. You may be more likely to have a fall if you have a fear of falling, feel depressed, or feel unable to do activities that you used to do. ?Alcohol use screening. Using too much alcohol can affect your balance and may make you more likely to have a fall. ?Follow these instructions at home: ?Lifestyle ?Do not drink alcohol if: ?Your health care provider tells you not to drink. ?If you drink alcohol: ?Limit how much you have to: ?0-1 drink a day for women. ?0-2 drinks a day for men. ?Know how much alcohol is in your drink. In the [place] one drink equals one 12 oz bottle of beer (355 mL), one 5 oz glass of wine (148 mL), or one 1? oz glass of hard liquor (44 mL). ?Do not use any products that contain nicotine or tobacco. These products include cigarettes, chewing tobacco, and vaping devices, such as e-cigarettes. If you need help quitting, ask your health care provider. ?Activity ? ?Follow a regular exercise program to stay fit. This will help you maintain your balance. Ask your health care provider what types of exercise are appropriate for you. ?If you need a cane or walker, use it as recommended by your health care provider. ?Wear supportive shoes that  have nonskid soles. ?Safety ? ?Remove any tripping hazards, such as rugs, cords, and clutter. ?Install safety equipment such as grab bars in bathrooms and safety rails on stairs. ?Keep rooms and walkways well-lit. ?General instructions ?Talk with your health care provider about your risks for falling. Tell your health care provider if: ?You fall. Be sure to tell your health care provider about all  falls, even ones that seem minor. ?You feel dizzy, tiredness (fatigue), or off-balance. ?Take over-the-counter and prescription medicines only as told by your health care provider. These include supplements. ?Eat a healthy diet and maintain a healthy weight. A healthy diet includes low-fat dairy products, low-fat (lean) meats, and fiber from whole grains, beans, and lots of fruits and vegetables. ?Stay current with your vaccines. ?Schedule regular health, dental, and eye exams. ?Summary ?Having a healthy lifestyle and getting preventive care can help to protect your health and wellness after age 23. ?Screening and testing are the best way to find a health problem early and help you avoid having a fall. Early diagnosis and treatment give you the best chance for managing medical conditions that are more common for people who are older than age 64. ?Falls are a major cause of broken bones and head injuries in people who are older than age 9. Take precautions to prevent a fall at home. ?Work with your health care provider to learn what changes you can make to improve your health and wellness and to prevent falls. ?This information is not intended to replace advice given to you by your health care provider. Make sure you discuss any questions you have with your health care provider. ?Document Revised: 08/19/2020 Document Reviewed: 08/19/2020 ?Elsevier Patient Education ? [place]. ? ?

## 2021-07-21 NOTE — Assessment & Plan Note (Addendum)
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 Microalbumin normal. Update labs when he returns fasting.  ?

## 2021-07-21 NOTE — Assessment & Plan Note (Signed)
Advanced directive discussion - does not have at home. Packet provided today. Wife then children would be HCPOA. Full code.  ?

## 2021-07-23 ENCOUNTER — Other Ambulatory Visit (INDEPENDENT_AMBULATORY_CARE_PROVIDER_SITE_OTHER): Payer: Medicare HMO

## 2021-07-23 DIAGNOSIS — E042 Nontoxic multinodular goiter: Secondary | ICD-10-CM

## 2021-07-23 DIAGNOSIS — I10 Essential (primary) hypertension: Secondary | ICD-10-CM

## 2021-07-23 DIAGNOSIS — Z125 Encounter for screening for malignant neoplasm of prostate: Secondary | ICD-10-CM | POA: Diagnosis not present

## 2021-07-23 DIAGNOSIS — N1832 Chronic kidney disease, stage 3b: Secondary | ICD-10-CM | POA: Diagnosis not present

## 2021-07-23 LAB — COMPREHENSIVE METABOLIC PANEL
ALT: 18 U/L (ref 0–53)
AST: 19 U/L (ref 0–37)
Albumin: 4.2 g/dL (ref 3.5–5.2)
Alkaline Phosphatase: 39 U/L (ref 39–117)
BUN: 20 mg/dL (ref 6–23)
CO2: 30 mEq/L (ref 19–32)
Calcium: 9.1 mg/dL (ref 8.4–10.5)
Chloride: 105 mEq/L (ref 96–112)
Creatinine, Ser: 1.54 mg/dL — ABNORMAL HIGH (ref 0.40–1.50)
GFR: 44.07 mL/min — ABNORMAL LOW (ref >60.00)
Glucose, Bld: 90 mg/dL (ref 70–99)
Potassium: 4.5 mEq/L (ref 3.5–5.1)
Sodium: 140 mEq/L (ref 135–145)
Total Bilirubin: 1.1 mg/dL (ref 0.2–1.2)
Total Protein: 6.6 g/dL (ref 6.0–8.3)

## 2021-07-23 LAB — CBC WITH DIFFERENTIAL/PLATELET
Basophils Absolute: 0 10*3/uL (ref 0.0–0.1)
Basophils Relative: 0.2 % (ref 0.0–3.0)
Eosinophils Absolute: 0.1 10*3/uL (ref 0.0–0.7)
Eosinophils Relative: 2 % (ref 0.0–5.0)
HCT: 38 % — ABNORMAL LOW (ref 39.0–52.0)
Hemoglobin: 13.2 g/dL (ref 13.0–17.0)
Lymphocytes Relative: 39.9 % (ref 12.0–46.0)
Lymphs Abs: 1.7 10*3/uL (ref 0.7–4.0)
MCHC: 34.7 g/dL (ref 30.0–36.0)
MCV: 89.7 fl (ref 78.0–100.0)
Monocytes Absolute: 0.3 10*3/uL (ref 0.1–1.0)
Monocytes Relative: 7.8 % (ref 3.0–12.0)
Neutro Abs: 2.2 10*3/uL (ref 1.4–7.7)
Neutrophils Relative %: 50.1 % (ref 43.0–77.0)
Platelets: 192 10*3/uL (ref 150.0–400.0)
RBC: 4.24 Mil/uL (ref 4.22–5.81)
RDW: 14.1 % (ref 11.5–15.5)
WBC: 4.3 10*3/uL (ref 4.0–10.5)

## 2021-07-23 LAB — TSH: TSH: 1.61 u[IU]/mL (ref 0.35–5.50)

## 2021-07-23 LAB — LIPID PANEL
Cholesterol: 167 mg/dL (ref 0–200)
HDL: 53.9 mg/dL (ref >39.00)
LDL Cholesterol: 98 mg/dL (ref 0–99)
NonHDL: 112.74
Total CHOL/HDL Ratio: 3
Triglycerides: 73 mg/dL (ref 0.0–149.0)
VLDL: 14.6 mg/dL (ref 0.0–40.0)

## 2021-07-23 LAB — VITAMIN D 25 HYDROXY (VIT D DEFICIENCY, FRACTURES): VITD: 32.94 ng/mL (ref 30.00–100.00)

## 2021-07-23 LAB — PSA, MEDICARE: PSA: 1.27 ng/ml (ref 0.10–4.00)

## 2021-07-24 ENCOUNTER — Encounter: Payer: Self-pay | Admitting: Family Medicine

## 2021-08-11 ENCOUNTER — Encounter: Payer: Medicare HMO | Admitting: Family Medicine

## 2021-09-17 DIAGNOSIS — G4733 Obstructive sleep apnea (adult) (pediatric): Secondary | ICD-10-CM | POA: Diagnosis not present

## 2021-10-28 ENCOUNTER — Other Ambulatory Visit: Payer: Self-pay | Admitting: Family Medicine

## 2021-10-28 NOTE — Telephone Encounter (Signed)
Change in therapy.  D/c benazepril, started losartan 50 mg daily.  (See 02/28/21 Refill note)

## 2022-03-20 DIAGNOSIS — G4733 Obstructive sleep apnea (adult) (pediatric): Secondary | ICD-10-CM | POA: Diagnosis not present

## 2022-04-02 DIAGNOSIS — H40003 Preglaucoma, unspecified, bilateral: Secondary | ICD-10-CM | POA: Diagnosis not present

## 2022-04-08 DIAGNOSIS — H40003 Preglaucoma, unspecified, bilateral: Secondary | ICD-10-CM | POA: Diagnosis not present

## 2022-04-16 ENCOUNTER — Encounter: Payer: Self-pay | Admitting: Ophthalmology

## 2022-04-24 NOTE — Discharge Instructions (Signed)
   Cataract Surgery, Care After ? ?This sheet gives you information about how to care for yourself after your surgery.  Your ophthalmologist may also give you more specific instructions.  If you have problems or questions, contact your doctor at [facility], [phone]. ? ?What can I expect after the surgery? ?It is common to have: ?Itching ?Foreign body sensation (feels like a grain of sand in the eye) ?Watery discharge (excess tearing) ?Sensitivity to light and touch ?Bruising in or around the eye ?Mild blurred vision ? ?Follow these instructions at home: ?Do not touch or rub your eyes. ?You may be told to wear a protective shield or sunglasses to protect your eyes. ?Do not put a contact lens in the operative eye unless your doctor approves. ?Keep the lids and face clean and dry. ?Do not allow water to hit you directly in the face while showering. ?Keep soap and shampoo out of your eyes. ?Do not use eye makeup for 1 week. ? ?Check your eye every day for signs of infection.  Watch for: ?Redness, swelling, or pain. ?Fluid, blood or pus. ?Worsening vision. ?Worsening sensitivity to light or touch. ? ?Activity: ?During the first day, avoid bending over and reading.  You may resume reading and bending the next day. ?Do not drive or use heavy machinery for at least 24 hours. ?Avoid strenuous activities for 1 week.  Activities such as walking, treadmill, exercise bike, and climbing stairs are okay. ?Do not lift heavy (>20 pound) objects for 1 week. ?Do not do yardwork, gardening, or dirty housework (mopping, cleaning bathrooms, vacuuming, etc.) for 1 week. ?Do not swim or use a hot tub for 2 weeks. ?Ask your doctor when you can return to work. ? ?General Instructions: ?Take or apply prescription and over-the-counter medicines as directed by your doctor, including eyedrops and ointments. ?Resume medications discontinued prior to surgery, unless told otherwise by your doctor. ?Keep all follow up appointments as  scheduled. ? ?Contact a health care provider if: ?You have increased bruising around your eye. ?You have pain that is not helped with medication. ?You have a fever. ?You have fluid, pus, or blood coming from your eye or incision. ?Your sensitivity to light gets worse. ?You have spots (floaters) of flashing lights in your vision. ?You have nausea or vomiting. ? ?Go to the nearest emergency room or call 911 if: ?You have sudden loss of vision. ?You have severe, worsening eye pain. ? ?

## 2022-04-24 NOTE — Anesthesia Preprocedure Evaluation (Signed)
Anesthesia Evaluation  Patient identified by MRN, date of birth, ID band Patient awake    Reviewed: Allergy & Precautions, H&P , NPO status , Patient's Chart, lab work & pertinent test results  Airway Mallampati: II  TM Distance: >3 FB Neck ROM: full    Dental no notable dental hx.    Pulmonary sleep apnea , former smoker   Pulmonary exam normal        Cardiovascular hypertension, Normal cardiovascular exam+ dysrhythmias (RBBB)      Neuro/Psych negative neurological ROS  negative psych ROS   GI/Hepatic negative GI ROS, Neg liver ROS,,,  Endo/Other  negative endocrine ROS    Renal/GU Renal InsufficiencyRenal disease     Musculoskeletal   Abdominal   Peds  Hematology negative hematology ROS (+)   Anesthesia Other Findings Past Medical History: No date: Chicken pox No date: Chronic kidney disease     Comment:  CKD No date: ED (erectile dysfunction) No date: HTN (hypertension)     Comment:  a. 11/2010 Echo: EF 55-65%. No date: Sleep apnea     Comment:  wears cpap No date: Wears hearing aid in both ears  Past Surgical History: 2017: COLONOSCOPY 05/2021: COLONOSCOPY     Comment:  diverticulosis o/w normal (Danis) No date: KNEE SURGERY; Left     Comment:  1970 No date: SHOULDER SURGERY; Left     Comment:  hx dislocatino with temp  pin age 36  from [place]  BMI    Body Mass Index: 28.29 kg/m      Reproductive/Obstetrics negative OB ROS                             Anesthesia Physical Anesthesia Plan  ASA: 2  Anesthesia Plan: MAC   Post-op Pain Management:    Induction:   PONV Risk Score and Plan:   Airway Management Planned: Natural Airway  Additional Equipment:   Intra-op Plan:   Post-operative Plan:   Informed Consent: I have reviewed the patients History and Physical, chart, labs and discussed the procedure including the risks, benefits and alternatives for the  proposed anesthesia with the patient or authorized representative who has indicated his/her understanding and acceptance.       Plan Discussed with: Anesthesiologist, CRNA and Surgeon  Anesthesia Plan Comments:        Anesthesia Quick Evaluation

## 2022-04-27 ENCOUNTER — Encounter: Payer: Self-pay | Admitting: Ophthalmology

## 2022-04-27 ENCOUNTER — Ambulatory Visit: Payer: Medicare HMO | Admitting: Anesthesiology

## 2022-04-27 ENCOUNTER — Other Ambulatory Visit: Payer: Self-pay

## 2022-04-27 ENCOUNTER — Ambulatory Visit
Admission: RE | Admit: 2022-04-27 | Discharge: 2022-04-27 | Disposition: A | Discharge status: Home / Self Care | Payer: Medicare HMO | Attending: Ophthalmology | Admitting: Ophthalmology

## 2022-04-27 ENCOUNTER — Encounter
Admission: RE | Disposition: A | Discharge status: Home / Self Care | Payer: Self-pay | Source: Home / Self Care | Attending: Ophthalmology

## 2022-04-27 DIAGNOSIS — G4730 Sleep apnea, unspecified: Secondary | ICD-10-CM | POA: Diagnosis not present

## 2022-04-27 DIAGNOSIS — N189 Chronic kidney disease, unspecified: Secondary | ICD-10-CM | POA: Insufficient documentation

## 2022-04-27 DIAGNOSIS — Z87891 Personal history of nicotine dependence: Secondary | ICD-10-CM | POA: Insufficient documentation

## 2022-04-27 DIAGNOSIS — Z79899 Other long term (current) drug therapy: Secondary | ICD-10-CM | POA: Diagnosis not present

## 2022-04-27 DIAGNOSIS — I129 Hypertensive chronic kidney disease with stage 1 through stage 4 chronic kidney disease, or unspecified chronic kidney disease: Secondary | ICD-10-CM | POA: Diagnosis not present

## 2022-04-27 DIAGNOSIS — H2511 Age-related nuclear cataract, right eye: Secondary | ICD-10-CM | POA: Insufficient documentation

## 2022-04-27 HISTORY — DX: Presence of external hearing-aid: Z97.4

## 2022-04-27 HISTORY — PX: CATARACT EXTRACTION W/PHACO: SHX586

## 2022-04-27 SURGERY — PHACOEMULSIFICATION, CATARACT, WITH IOL INSERTION
Anesthesia: Monitor Anesthesia Care | Site: Eye | Laterality: Right

## 2022-04-27 MED ORDER — LIDOCAINE HCL (PF) 2 % IJ SOLN
INTRAOCULAR | Status: DC | PRN
  Administered 2022-04-27: 1 mL via INTRAOCULAR

## 2022-04-27 MED ORDER — SIGHTPATH DOSE#1 BSS IO SOLN
INTRAOCULAR | Status: DC | PRN
  Administered 2022-04-27: 103 mL via OPHTHALMIC

## 2022-04-27 MED ORDER — SIGHTPATH DOSE#1 BSS IO SOLN
INTRAOCULAR | Status: DC | PRN
  Administered 2022-04-27: 15 mL

## 2022-04-27 MED ORDER — TETRACAINE HCL 0.5 % OP SOLN
1.0000 [drp] | OPHTHALMIC | Status: DC | PRN
  Administered 2022-04-27 (×3): 1 [drp] via OPHTHALMIC

## 2022-04-27 MED ORDER — MIDAZOLAM HCL 2 MG/2ML IJ SOLN
INTRAMUSCULAR | Status: DC | PRN
  Administered 2022-04-27: 2 mg via INTRAVENOUS

## 2022-04-27 MED ORDER — SIGHTPATH DOSE#1 NA HYALUR & NA CHOND-NA HYALUR IO KIT
PACK | INTRAOCULAR | Status: DC | PRN
  Administered 2022-04-27: 1 via OPHTHALMIC

## 2022-04-27 MED ORDER — ARMC OPHTHALMIC DILATING DROPS
1.0000 | OPHTHALMIC | Status: DC | PRN
Start: 2022-04-27 — End: 2022-04-27
  Administered 2022-04-27 (×3): 1 via OPHTHALMIC

## 2022-04-27 MED ORDER — FENTANYL CITRATE (PF) 100 MCG/2ML IJ SOLN
INTRAMUSCULAR | Status: DC | PRN
  Administered 2022-04-27: 50 ug via INTRAVENOUS

## 2022-04-27 MED ORDER — MOXIFLOXACIN HCL 0.5 % OP SOLN
OPHTHALMIC | Status: DC | PRN
  Administered 2022-04-27: .2 mL via OPHTHALMIC

## 2022-04-27 SURGICAL SUPPLY — 19 items
CANNULA ANT/CHMB 27G (MISCELLANEOUS) IMPLANT
CANNULA ANT/CHMB 27GA (MISCELLANEOUS) IMPLANT
CATARACT SUITE SIGHTPATH (MISCELLANEOUS) ×1 IMPLANT
DISSECTOR HYDRO NUCLEUS 50X22 (MISCELLANEOUS) ×1 IMPLANT
FEE CATARACT SUITE SIGHTPATH (MISCELLANEOUS) ×1 IMPLANT
GLOVE SURG GAMMEX PI TX LF 7.5 (GLOVE) ×1 IMPLANT
GLOVE SURG SYN 8.5  E (GLOVE) ×1
GLOVE SURG SYN 8.5 E (GLOVE) ×1 IMPLANT
GLOVE SURG SYN 8.5 PF PI (GLOVE) ×1 IMPLANT
LENS IOL TECNIS EYHANCE 13.5 (Intraocular Lens) IMPLANT
NDL FILTER BLUNT 18X1 1/2 (NEEDLE) ×1 IMPLANT
NEEDLE FILTER BLUNT 18X1 1/2 (NEEDLE) ×1 IMPLANT
PACK VIT ANT 23G (MISCELLANEOUS) IMPLANT
RING MALYGIN (MISCELLANEOUS) IMPLANT
SUT ETHILON 10-0 CS-B-6CS-B-6 (SUTURE)
SUTURE EHLN 10-0 CS-B-6CS-B-6 (SUTURE) IMPLANT
SYR 3ML LL SCALE MARK (SYRINGE) ×1 IMPLANT
SYR 5ML LL (SYRINGE) ×1 IMPLANT
WATER STERILE IRR 250ML POUR (IV SOLUTION) ×1 IMPLANT

## 2022-04-27 NOTE — H&P (Signed)
[facility]   Primary Care Physician:  Ria Bush, MD Ophthalmologist: Dr. Benay Pillow  Pre-Procedure History & Physical: HPI:  [name] is a 77 y.o. male here for cataract surgery.   Past Medical History:  Diagnosis Date   Chicken pox    Chronic kidney disease    CKD   ED (erectile dysfunction)    HTN (hypertension)    a. 11/2010 Echo: EF 55-65%.   Sleep apnea    wears cpap   Wears hearing aid in both ears     Past Surgical History:  Procedure Laterality Date   COLONOSCOPY  2017   COLONOSCOPY  05/2021   diverticulosis o/w normal (Danis)   KNEE SURGERY Left    1970   SHOULDER SURGERY Left    hx dislocatino with temp  pin age 89  from [place]    Prior to Admission medications   Medication Sig Start Date End Date Taking? Authorizing Provider  amLODipine (NORVASC) 10 MG tablet Take 10 mg by mouth daily.   Yes [provider]  cholecalciferol (VITAMIN D) 1000 UNITS tablet Take 1,000 Units by mouth daily. Patient takes 3 times a week   Yes [provider]  acetaminophen (TYLENOL) 500 MG tablet Take 500 mg by mouth every 6 (six) hours as needed for moderate pain.    [provider]    Allergies as of 04/09/2022 - Review Complete 07/21/2021  Allergen Reaction Noted   Naproxen Itching 06/21/2017    Family History  Problem Relation Age of Onset   Arthritis Mother    Cancer Mother        cancer growing close to pancreas- unsure of type of cancer   Hypertension Mother    Lung disease Father 42       black lung disease - Horticulturist, commercial   Alzheimer's disease Sister    Single kidney Sister        s/p kidney transplant   Colon cancer Brother        metastasized - unclear primary - died age 43   Esophageal cancer Neg Hx    Rectal cancer Neg Hx    Stomach cancer Neg Hx     Social History   Socioeconomic History   Marital status: Married    Spouse name: Not on file   Number of children: Not on file   Years of  education: Not on file   Highest education level: Not on file  Occupational History   Occupation: retired    Comment: clergy  Tobacco Use   Smoking status: Former    Packs/day: 0.50    Years: 10.00    Total pack years: 5.00    Types: Cigarettes    Quit date: 07/12/1976    Years since quitting: 45.8   Smokeless tobacco: Never   Tobacco comments:    Quit 1978  Vaping Use   Vaping Use: Never used  Substance and Sexual Activity   Alcohol use: No    Alcohol/week: 0.0 standard drinks of alcohol   Drug use: No   Sexual activity: Yes  Other Topics Concern   Not on file  Social History Narrative   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    Occ: retired, was Research officer, trade union   Diet: good water, fruits/vegetables daily    Activity: regular walking    Social Determinants of Health  Financial Resource Strain: Not on file  Food Insecurity: Not on file  Transportation Needs: Not on file  Physical Activity: Not on file  Stress: Not on file  Social Connections: Not on file  Intimate Partner Violence: Not on file    Review of Systems: See HPI, otherwise negative ROS  Physical Exam: BP (!) 145/87   Temp (!) 97.3 F (36.3 C) (Tympanic)   Resp 12   Ht 5' 10.51" (1.791 m)   Wt 93.3 kg   SpO2 98%   BMI 29.07 kg/m  General:   Alert, cooperative in NAD Head:  Normocephalic and atraumatic. Respiratory:  Normal work of breathing. Cardiovascular:  RRR  Impression/Plan: [name] is here for cataract surgery.  Risks, benefits, limitations, and alternatives regarding cataract surgery have been reviewed with the patient.  Questions have been answered.  All parties agreeable.   Benay Pillow, MD  04/27/2022, 10:45 AM

## 2022-04-27 NOTE — Op Note (Signed)
OPERATIVE NOTE  THEOPHIL THIVIERGE [id number] 04/27/2022   PREOPERATIVE DIAGNOSIS:  Nuclear sclerotic cataract right eye.  H25.11   POSTOPERATIVE DIAGNOSIS:    Nuclear sclerotic cataract right eye.     PROCEDURE:  Phacoemusification with posterior chamber intraocular lens placement of the right eye   LENS:   Implant Name Type Inv. Item Serial No. Manufacturer Lot No. LRB No. Used Action  LENS IOL TECNIS EYHANCE 13.5 - [id number] Intraocular Lens LENS IOL TECNIS EYHANCE 13.5 [id number] [employer]  Right 1 Implanted       Procedure(s) with comments: CATARACT EXTRACTION PHACO AND INTRAOCULAR LENS PLACEMENT (IOC) RIGHT (Right) - 7.65 0:53.2  DIB00 +13.5   ULTRASOUND TIME: 0 minutes 53 seconds.  CDE 7.65   SURGEON:  Benay Pillow, MD, MPH  ANESTHESIOLOGIST: Anesthesiologist: [place] Ouch, MD CRNA: Londell Moh, CRNA   ANESTHESIA:  Topical with tetracaine drops augmented with 1% preservative-free intracameral lidocaine.  ESTIMATED BLOOD LOSS: less than 1 mL.   COMPLICATIONS:  None.   DESCRIPTION OF PROCEDURE:  The patient was identified in the holding room and transported to the operating room and placed in the supine position under the operating microscope.  The right eye was identified as the operative eye and it was prepped and draped in the usual sterile ophthalmic fashion.   A 1.0 millimeter clear-corneal paracentesis was made at the 10:30 position. 0.5 ml of preservative-free 1% lidocaine with epinephrine was injected into the anterior chamber.  The anterior chamber was filled with viscoelastic.  A 2.4 millimeter keratome was used to make a near-clear corneal incision at the 8:00 position.  A curvilinear capsulorrhexis was made with a cystotome and capsulorrhexis forceps.  Balanced salt solution was used to hydrodissect and hydrodelineate the nucleus.   Phacoemulsification was then used in stop and chop fashion to remove the lens nucleus and epinucleus.  The remaining  cortex was then removed using the irrigation and aspiration handpiece. Viscoelastic was then placed into the capsular bag to distend it for lens placement.  A lens was then injected into the capsular bag.  The remaining viscoelastic was aspirated.   Wounds were hydrated with balanced salt solution.  The anterior chamber was inflated to a physiologic pressure with balanced salt solution.   Intracameral vigamox 0.1 mL undiluted was injected into the eye and a drop placed onto the ocular surface.  No wound leaks were noted.  The patient was taken to the recovery room in stable condition without complications of anesthesia or surgery  Benay Pillow 04/27/2022, 11:15 AM

## 2022-04-27 NOTE — Anesthesia Postprocedure Evaluation (Signed)
Anesthesia Post Note  Patient: Juliene Pina  Procedure(s) Performed: CATARACT EXTRACTION PHACO AND INTRAOCULAR LENS PLACEMENT (IOC) RIGHT (Right: Eye)  Patient location during evaluation: PACU Anesthesia Type: MAC Level of consciousness: awake and alert Pain management: pain level controlled Vital Signs Assessment: post-procedure vital signs reviewed and stable Respiratory status: spontaneous breathing, nonlabored ventilation and respiratory function stable Cardiovascular status: blood pressure returned to baseline and stable Postop Assessment: no apparent nausea or vomiting Anesthetic complications: no   No notable events documented.   Last Vitals:  Vitals:   04/27/22 1118 04/27/22 1123  BP: 114/83 128/80  Pulse: (!) 55 (!) 58  Resp: 18 13  Temp: 36.5 C   SpO2: 98% 97%    Last Pain:  Vitals:   04/27/22 1123  TempSrc:   PainSc: 0-No pain                 Iran Ouch

## 2022-04-27 NOTE — Transfer of Care (Signed)
Immediate Anesthesia Transfer of Care Note  Patient: [name]  Procedure(s) Performed: CATARACT EXTRACTION PHACO AND INTRAOCULAR LENS PLACEMENT (IOC) RIGHT (Right: Eye)  Patient Location: PACU  Anesthesia Type: MAC  Level of Consciousness: awake, alert  and patient cooperative  Airway and Oxygen Therapy: Patient Spontanous Breathing and Patient connected to supplemental oxygen  Post-op Assessment: Post-op Vital signs reviewed, Patient's Cardiovascular Status Stable, Respiratory Function Stable, Patent Airway and No signs of Nausea or vomiting  Post-op Vital Signs: Reviewed and stable  Complications: No notable events documented.

## 2022-04-28 ENCOUNTER — Encounter: Payer: Self-pay | Admitting: Ophthalmology

## 2022-05-07 NOTE — Discharge Instructions (Signed)
   Cataract Surgery, Care After ? ?This sheet gives you information about how to care for yourself after your surgery.  Your ophthalmologist may also give you more specific instructions.  If you have problems or questions, contact your doctor at [facility], [phone]. ? ?What can I expect after the surgery? ?It is common to have: ?Itching ?Foreign body sensation (feels like a grain of sand in the eye) ?Watery discharge (excess tearing) ?Sensitivity to light and touch ?Bruising in or around the eye ?Mild blurred vision ? ?Follow these instructions at home: ?Do not touch or rub your eyes. ?You may be told to wear a protective shield or sunglasses to protect your eyes. ?Do not put a contact lens in the operative eye unless your doctor approves. ?Keep the lids and face clean and dry. ?Do not allow water to hit you directly in the face while showering. ?Keep soap and shampoo out of your eyes. ?Do not use eye makeup for 1 week. ? ?Check your eye every day for signs of infection.  Watch for: ?Redness, swelling, or pain. ?Fluid, blood or pus. ?Worsening vision. ?Worsening sensitivity to light or touch. ? ?Activity: ?During the first day, avoid bending over and reading.  You may resume reading and bending the next day. ?Do not drive or use heavy machinery for at least 24 hours. ?Avoid strenuous activities for 1 week.  Activities such as walking, treadmill, exercise bike, and climbing stairs are okay. ?Do not lift heavy (>20 pound) objects for 1 week. ?Do not do yardwork, gardening, or dirty housework (mopping, cleaning bathrooms, vacuuming, etc.) for 1 week. ?Do not swim or use a hot tub for 2 weeks. ?Ask your doctor when you can return to work. ? ?General Instructions: ?Take or apply prescription and over-the-counter medicines as directed by your doctor, including eyedrops and ointments. ?Resume medications discontinued prior to surgery, unless told otherwise by your doctor. ?Keep all follow up appointments as  scheduled. ? ?Contact a health care provider if: ?You have increased bruising around your eye. ?You have pain that is not helped with medication. ?You have a fever. ?You have fluid, pus, or blood coming from your eye or incision. ?Your sensitivity to light gets worse. ?You have spots (floaters) of flashing lights in your vision. ?You have nausea or vomiting. ? ?Go to the nearest emergency room or call 911 if: ?You have sudden loss of vision. ?You have severe, worsening eye pain. ? ?

## 2022-05-11 ENCOUNTER — Ambulatory Visit: Payer: Medicare HMO | Admitting: Anesthesiology

## 2022-05-11 ENCOUNTER — Encounter: Payer: Self-pay | Admitting: Ophthalmology

## 2022-05-11 ENCOUNTER — Other Ambulatory Visit: Payer: Self-pay

## 2022-05-11 ENCOUNTER — Ambulatory Visit
Admission: RE | Admit: 2022-05-11 | Discharge: 2022-05-11 | Disposition: A | Discharge status: Home / Self Care | Payer: Medicare HMO | Attending: Ophthalmology | Admitting: Ophthalmology

## 2022-05-11 ENCOUNTER — Encounter
Admission: RE | Disposition: A | Discharge status: Home / Self Care | Payer: Self-pay | Source: Home / Self Care | Attending: Ophthalmology

## 2022-05-11 DIAGNOSIS — Z87891 Personal history of nicotine dependence: Secondary | ICD-10-CM | POA: Insufficient documentation

## 2022-05-11 DIAGNOSIS — I129 Hypertensive chronic kidney disease with stage 1 through stage 4 chronic kidney disease, or unspecified chronic kidney disease: Secondary | ICD-10-CM | POA: Diagnosis not present

## 2022-05-11 DIAGNOSIS — H2512 Age-related nuclear cataract, left eye: Secondary | ICD-10-CM | POA: Diagnosis not present

## 2022-05-11 DIAGNOSIS — N189 Chronic kidney disease, unspecified: Secondary | ICD-10-CM | POA: Insufficient documentation

## 2022-05-11 DIAGNOSIS — G4730 Sleep apnea, unspecified: Secondary | ICD-10-CM | POA: Diagnosis not present

## 2022-05-11 HISTORY — PX: CATARACT EXTRACTION W/PHACO: SHX586

## 2022-05-11 SURGERY — PHACOEMULSIFICATION, CATARACT, WITH IOL INSERTION
Anesthesia: Monitor Anesthesia Care | Site: Eye | Laterality: Left

## 2022-05-11 MED ORDER — TETRACAINE HCL 0.5 % OP SOLN
1.0000 [drp] | OPHTHALMIC | Status: DC | PRN
  Administered 2022-05-11 (×3): 1 [drp] via OPHTHALMIC

## 2022-05-11 MED ORDER — LACTATED RINGERS IV SOLN: INTRAVENOUS | Status: DC

## 2022-05-11 MED ORDER — LIDOCAINE HCL (PF) 2 % IJ SOLN
INTRAOCULAR | Status: DC | PRN
  Administered 2022-05-11: 1 mL via INTRAOCULAR

## 2022-05-11 MED ORDER — MIDAZOLAM HCL 2 MG/2ML IJ SOLN
INTRAMUSCULAR | Status: DC | PRN
  Administered 2022-05-11: 2 mg via INTRAVENOUS

## 2022-05-11 MED ORDER — SIGHTPATH DOSE#1 NA HYALUR & NA CHOND-NA HYALUR IO KIT
PACK | INTRAOCULAR | Status: DC | PRN
  Administered 2022-05-11: 1 via OPHTHALMIC

## 2022-05-11 MED ORDER — SIGHTPATH DOSE#1 BSS IO SOLN
INTRAOCULAR | Status: DC | PRN
  Administered 2022-05-11: 15 mL

## 2022-05-11 MED ORDER — FENTANYL CITRATE (PF) 100 MCG/2ML IJ SOLN
INTRAMUSCULAR | Status: DC | PRN
  Administered 2022-05-11: 50 ug via INTRAVENOUS

## 2022-05-11 MED ORDER — MOXIFLOXACIN HCL 0.5 % OP SOLN
OPHTHALMIC | Status: DC | PRN
  Administered 2022-05-11: .2 mL via OPHTHALMIC

## 2022-05-11 MED ORDER — SIGHTPATH DOSE#1 BSS IO SOLN
INTRAOCULAR | Status: DC | PRN
  Administered 2022-05-11: 75 mL via OPHTHALMIC

## 2022-05-11 MED ORDER — ARMC OPHTHALMIC DILATING DROPS
1.0000 | OPHTHALMIC | Status: DC | PRN
  Administered 2022-05-11 (×3): 1 via OPHTHALMIC

## 2022-05-11 SURGICAL SUPPLY — 13 items
CATARACT SUITE SIGHTPATH (MISCELLANEOUS) ×1 IMPLANT
DISSECTOR HYDRO NUCLEUS 50X22 (MISCELLANEOUS) ×1 IMPLANT
FEE CATARACT SUITE SIGHTPATH (MISCELLANEOUS) ×1 IMPLANT
GLOVE SURG GAMMEX PI TX LF 7.5 (GLOVE) ×1 IMPLANT
GLOVE SURG SYN 8.5  E (GLOVE) ×1
GLOVE SURG SYN 8.5 E (GLOVE) ×1 IMPLANT
GLOVE SURG SYN 8.5 PF PI (GLOVE) ×1 IMPLANT
LENS IOL TECNIS EYHANCE 13.0 (Intraocular Lens) IMPLANT
NDL FILTER BLUNT 18X1 1/2 (NEEDLE) ×1 IMPLANT
NEEDLE FILTER BLUNT 18X1 1/2 (NEEDLE) ×1 IMPLANT
SYR 3ML LL SCALE MARK (SYRINGE) ×1 IMPLANT
SYR 5ML LL (SYRINGE) ×1 IMPLANT
WATER STERILE IRR 250ML POUR (IV SOLUTION) ×1 IMPLANT

## 2022-05-11 NOTE — Anesthesia Preprocedure Evaluation (Signed)
Anesthesia Evaluation  Patient identified by MRN, date of birth, ID band Patient awake    Reviewed: Allergy & Precautions, H&P , NPO status , Patient's Chart, lab work & pertinent test results  Airway Mallampati: II  TM Distance: >3 FB Neck ROM: full    Dental no notable dental hx.    Pulmonary sleep apnea , former smoker   Pulmonary exam normal        Cardiovascular hypertension, Normal cardiovascular exam+ dysrhythmias (RBBB)      Neuro/Psych negative neurological ROS  negative psych ROS   GI/Hepatic negative GI ROS, Neg liver ROS,,,  Endo/Other  negative endocrine ROS    Renal/GU Renal InsufficiencyRenal disease     Musculoskeletal   Abdominal   Peds  Hematology negative hematology ROS (+)   Anesthesia Other Findings Past Medical History: No date: Chicken pox No date: Chronic kidney disease     Comment:  CKD No date: ED (erectile dysfunction) No date: HTN (hypertension)     Comment:  a. 11/2010 Echo: EF 55-65%. No date: Sleep apnea     Comment:  wears cpap No date: Wears hearing aid in both ears  Past Surgical History: 2017: COLONOSCOPY 05/2021: COLONOSCOPY     Comment:  diverticulosis o/w normal (Danis) No date: KNEE SURGERY; Left     Comment:  1970 No date: SHOULDER SURGERY; Left     Comment:  hx dislocatino with temp  pin age 2  from [place]  BMI    Body Mass Index: 28.29 kg/m      Reproductive/Obstetrics negative OB ROS                              Anesthesia Physical Anesthesia Plan  ASA: 2  Anesthesia Plan: MAC   Post-op Pain Management:    Induction:   PONV Risk Score and Plan:   Airway Management Planned: Natural Airway  Additional Equipment:   Intra-op Plan:   Post-operative Plan:   Informed Consent: I have reviewed the patients History and Physical, chart, labs and discussed the procedure including the risks, benefits and alternatives for the  proposed anesthesia with the patient or authorized representative who has indicated his/her understanding and acceptance.       Plan Discussed with: Anesthesiologist, CRNA and Surgeon  Anesthesia Plan Comments:         Anesthesia Quick Evaluation

## 2022-05-11 NOTE — Anesthesia Postprocedure Evaluation (Signed)
Anesthesia Post Note  Patient: [name]  Procedure(s) Performed: CATARACT EXTRACTION PHACO AND INTRAOCULAR LENS PLACEMENT (IOC) LEFT  3.98  00:31.0 (Left: Eye)  Patient location during evaluation: PACU Anesthesia Type: MAC Level of consciousness: awake and alert Pain management: pain level controlled Vital Signs Assessment: post-procedure vital signs reviewed and stable Respiratory status: spontaneous breathing, nonlabored ventilation, respiratory function stable and patient connected to nasal cannula oxygen Cardiovascular status: stable and blood pressure returned to baseline Postop Assessment: no apparent nausea or vomiting Anesthetic complications: no   No notable events documented.   Last Vitals:  Vitals:   05/11/22 1303 05/11/22 1309  BP: 112/82 117/81  Pulse: 79 (!) 53  Resp: 10 12  Temp: (!) 36.1 C (!) 36.1 C  SpO2: 98% 98%    Last Pain:  Vitals:   05/11/22 1309  TempSrc:   PainSc: 0-No pain                 Martha Clan

## 2022-05-11 NOTE — Transfer of Care (Signed)
Immediate Anesthesia Transfer of Care Note  Patient: [name]  Procedure(s) Performed: CATARACT EXTRACTION PHACO AND INTRAOCULAR LENS PLACEMENT (IOC) LEFT  3.98  00:31.0 (Left: Eye)  Patient Location: PACU  Anesthesia Type:MAC  Level of Consciousness: awake, alert , and oriented  Airway & Oxygen Therapy: Patient Spontanous Breathing  Post-op Assessment: Report given to RN and Post -op Vital signs reviewed and stable  Post vital signs: Reviewed and stable  Last Vitals:  Vitals Value Taken Time  BP    Temp 36.1 C 05/11/22 1303  Pulse 86 05/11/22 1304  Resp 16 05/11/22 1304  SpO2 96 % 05/11/22 1304  Vitals shown include unvalidated device data.  Last Pain:  Vitals:   05/11/22 1134  TempSrc: Temporal  PainSc: 0-No pain         Complications: No notable events documented.

## 2022-05-11 NOTE — Op Note (Signed)
OPERATIVE NOTE  [name] [id number] 05/11/2022   PREOPERATIVE DIAGNOSIS:  Nuclear sclerotic cataract left eye.  H25.12   POSTOPERATIVE DIAGNOSIS:    Nuclear sclerotic cataract left eye.     PROCEDURE:  Phacoemusification with posterior chamber intraocular lens placement of the left eye   LENS:   Implant Name Type Inv. Item Serial No. Manufacturer Lot No. LRB No. Used Action  LENS IOL TECNIS EYHANCE 13.0 - [id number] Intraocular Lens LENS IOL TECNIS EYHANCE 13.0 [id number] [employer]  Left 1 Implanted      Procedure(s) with comments: CATARACT EXTRACTION PHACO AND INTRAOCULAR LENS PLACEMENT (IOC) LEFT  3.98  00:31.0 (Left) - Sleep apnea  DIB00 +13.0   ULTRASOUND TIME: 0 minutes 31 seconds.  CDE 3.98   SURGEON:  Benay Pillow, MD, MPH   ANESTHESIA:  Topical with tetracaine drops augmented with 1% preservative-free intracameral lidocaine.  ESTIMATED BLOOD LOSS: <1 mL   COMPLICATIONS:  None.   DESCRIPTION OF PROCEDURE:  The patient was identified in the holding room and transported to the operating room and placed in the supine position under the operating microscope.  The left eye was identified as the operative eye and it was prepped and draped in the usual sterile ophthalmic fashion.   A 1.0 millimeter clear-corneal paracentesis was made at the 5:00 position. 0.5 ml of preservative-free 1% lidocaine with epinephrine was injected into the anterior chamber.  The anterior chamber was filled with viscoelastic.  A 2.4 millimeter keratome was used to make a near-clear corneal incision at the 2:00 position.  A curvilinear capsulorrhexis was made with a cystotome and capsulorrhexis forceps.  Balanced salt solution was used to hydrodissect and hydrodelineate the nucleus.   Phacoemulsification was then used in stop and chop fashion to remove the lens nucleus and epinucleus.  The remaining cortex was then removed using the irrigation and aspiration handpiece. Viscoelastic was then  placed into the capsular bag to distend it for lens placement.  A lens was then injected into the capsular bag.  The remaining viscoelastic was aspirated.   Wounds were hydrated with balanced salt solution.  The anterior chamber was inflated to a physiologic pressure with balanced salt solution.  Intracameral vigamox 0.1 mL undiltued was injected into the eye and a drop placed onto the ocular surface.  No wound leaks were noted.  The patient was taken to the recovery room in stable condition without complications of anesthesia or surgery  Benay Pillow 05/11/2022, 1:00 PM

## 2022-05-11 NOTE — H&P (Signed)
[facility]   Primary Care Physician:  Ria Bush, MD Ophthalmologist: Dr. Benay Pillow  Pre-Procedure History & Physical: HPI:  [name] is a 77 y.o. male here for cataract surgery.   Past Medical History:  Diagnosis Date   Chicken pox    Chronic kidney disease    CKD   ED (erectile dysfunction)    HTN (hypertension)    a. 11/2010 Echo: EF 55-65%.   Sleep apnea    wears cpap   Wears hearing aid in both ears     Past Surgical History:  Procedure Laterality Date   CATARACT EXTRACTION W/PHACO Right 04/27/2022   Procedure: CATARACT EXTRACTION PHACO AND INTRAOCULAR LENS PLACEMENT (IOC) RIGHT;  Surgeon: Eulogio Bear, MD;  Location: [place];  Service: Ophthalmology;  Laterality: Right;  7.65 0:53.2   COLONOSCOPY  2017   COLONOSCOPY  05/2021   diverticulosis o/w normal (Danis)   KNEE SURGERY Left    1970   SHOULDER SURGERY Left    hx dislocatino with temp  pin age 43  from [place]    Prior to Admission medications   Medication Sig Start Date End Date Taking? Authorizing Provider  acetaminophen (TYLENOL) 500 MG tablet Take 500 mg by mouth every 6 (six) hours as needed for moderate pain.   Yes [provider]  amLODipine (NORVASC) 10 MG tablet Take 10 mg by mouth daily.   Yes [provider]  cholecalciferol (VITAMIN D) 1000 UNITS tablet Take 1,000 Units by mouth daily. Patient takes 3 times a week   Yes [provider]    Allergies as of 04/09/2022 - Review Complete 07/21/2021  Allergen Reaction Noted   Naproxen Itching 06/21/2017    Family History  Problem Relation Age of Onset   Arthritis Mother    Cancer Mother        cancer growing close to pancreas- unsure of type of cancer   Hypertension Mother    Lung disease Father 67       black lung disease - Horticulturist, commercial   Alzheimer's disease Sister    Single kidney Sister        s/p kidney transplant   Colon cancer Brother        metastasized -  unclear primary - died age 40   Esophageal cancer Neg Hx    Rectal cancer Neg Hx    Stomach cancer Neg Hx     Social History   Socioeconomic History   Marital status: Married    Spouse name: Not on file   Number of children: Not on file   Years of education: Not on file   Highest education level: Not on file  Occupational History   Occupation: retired    Comment: clergy  Tobacco Use   Smoking status: Former    Packs/day: 0.50    Years: 10.00    Total pack years: 5.00    Types: Cigarettes    Quit date: 07/12/1976    Years since quitting: 45.8   Smokeless tobacco: Never   Tobacco comments:    Quit 1978  Vaping Use   Vaping Use: Never used  Substance and Sexual Activity   Alcohol use: No    Alcohol/week: 0.0 standard drinks of alcohol   Drug use: No   Sexual activity: Yes  Other Topics Concern   Not on file  Social History Narrative   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  Lived many years in [place] with wife.    Occ: retired, was Research officer, trade union   Diet: good water, fruits/vegetables daily    Activity: regular walking    Social Determinants of Radio broadcast assistant Strain: Not on file  Food Insecurity: Not on file  Transportation Needs: Not on file  Physical Activity: Not on file  Stress: Not on file  Social Connections: Not on file  Intimate Partner Violence: Not on file    Review of Systems: See HPI, otherwise negative ROS  Physical Exam: BP 138/86   Pulse (!) 58   Temp 98.1 F (36.7 C) (Temporal)   Resp 15   Ht 5' 10.5" (1.791 m)   Wt 93 kg   SpO2 100%   BMI 29.00 kg/m  General:   Alert, cooperative in NAD Head:  Normocephalic and atraumatic. Respiratory:  Normal work of breathing. Cardiovascular:  RRR  Impression/Plan: [name] is here for cataract surgery.  Risks, benefits, limitations, and alternatives regarding cataract surgery have been reviewed with the patient.   Questions have been answered.  All parties agreeable.   Benay Pillow, MD  05/11/2022, 12:36 PM

## 2022-05-12 ENCOUNTER — Encounter: Payer: Self-pay | Admitting: Ophthalmology

## 2022-06-22 ENCOUNTER — Encounter: Payer: Self-pay | Admitting: Family Medicine

## 2022-06-22 ENCOUNTER — Ambulatory Visit (INDEPENDENT_AMBULATORY_CARE_PROVIDER_SITE_OTHER): Payer: Medicare HMO | Admitting: Family Medicine

## 2022-06-22 VITALS — BP 130/72 | HR 68 | Temp 98.2°F | Resp 20 | Ht 70.5 in | Wt 206.0 lb

## 2022-06-22 DIAGNOSIS — A084 Viral intestinal infection, unspecified: Secondary | ICD-10-CM | POA: Diagnosis not present

## 2022-06-22 MED ORDER — FAMOTIDINE 20 MG PO TABS: 20.0000 mg | ORAL_TABLET | Freq: Two times a day (BID) | ORAL | 0 refills | Status: DC

## 2022-06-22 NOTE — Progress Notes (Signed)
   Assessment & Plan:  1. Viral gastroenteritis Encouraged to eat more of a bland diet, education provided on this.  Start famotidine twice daily until his stomach starts to feel better. - famotidine (PEPCID) 20 MG tablet; Take 1 tablet (20 mg total) by mouth 2 (two) times daily.  Dispense: 30 tablet; Refill: 0   Follow up plan: Return if symptoms worsen or fail to improve.  Hendricks Limes, MSN, APRN, FNP-C  Subjective:  HPI: [name] is a 77 y.o. male presenting on 06/22/2022 for gi upset (Upset for a few days - Diarrhea and nausea (no vomiting) - diarrhea is better, but stomach is still "queezy" /Decreased appetite )  Patient reports diarrhea and nausea that started last week. The diarrhea resolved last Thursday, but his stomach is still queezy. His appetite had decreased, but he is now eating more.    ROS: Negative unless specifically indicated above in HPI.   Relevant past medical history reviewed and updated as indicated.   Allergies and medications reviewed and updated.   Current Outpatient Medications:    acetaminophen (TYLENOL) 500 MG tablet, Take 500 mg by mouth every 6 (six) hours as needed for moderate pain., Disp: , Rfl:    amLODipine (NORVASC) 10 MG tablet, Take 10 mg by mouth daily., Disp: , Rfl:    cholecalciferol (VITAMIN D) 1000 UNITS tablet, Take 1,000 Units by mouth daily. Patient takes 3 times a week, Disp: , Rfl:   Allergies  Allergen Reactions   Naproxen Itching    Objective:   BP 130/72   Pulse 68   Temp 98.2 F (36.8 C)   Resp 20   Ht 5' 10.5" (1.791 m)   Wt 206 lb (93.4 kg)   BMI 29.14 kg/m    Physical Exam Vitals reviewed.  Constitutional:      General: He is not in acute distress.    Appearance: Normal appearance. He is not ill-appearing, toxic-appearing or diaphoretic.  HENT:     Head: Normocephalic and atraumatic.  Eyes:     General: No scleral icterus.       Right eye: No discharge.        Left eye: No discharge.      Conjunctiva/sclera: Conjunctivae normal.  Cardiovascular:     Rate and Rhythm: Normal rate.  Pulmonary:     Effort: Pulmonary effort is normal. No respiratory distress.  Abdominal:     General: Abdomen is flat. Bowel sounds are normal. There is no distension.     Palpations: There is no mass.     Tenderness: There is no abdominal tenderness. There is no guarding or rebound.     Hernia: No hernia is present.  Musculoskeletal:        General: Normal range of motion.     Cervical back: Normal range of motion.  Skin:    General: Skin is warm and dry.  Neurological:     Mental Status: He is alert and oriented to person, place, and time. Mental status is at baseline.  Psychiatric:        Mood and Affect: Mood normal.        Behavior: Behavior normal.        Thought Content: Thought content normal.        Judgment: Judgment normal.

## 2022-07-27 ENCOUNTER — Ambulatory Visit (INDEPENDENT_AMBULATORY_CARE_PROVIDER_SITE_OTHER): Payer: Medicare HMO | Admitting: Family Medicine

## 2022-07-27 ENCOUNTER — Encounter: Payer: Self-pay | Admitting: Family Medicine

## 2022-07-27 VITALS — BP 126/66 | HR 66 | Temp 97.5°F | Ht 70.25 in | Wt 205.5 lb

## 2022-07-27 DIAGNOSIS — Z7189 Other specified counseling: Secondary | ICD-10-CM

## 2022-07-27 DIAGNOSIS — H9193 Unspecified hearing loss, bilateral: Secondary | ICD-10-CM

## 2022-07-27 DIAGNOSIS — N1832 Chronic kidney disease, stage 3b: Secondary | ICD-10-CM

## 2022-07-27 DIAGNOSIS — Z0000 Encounter for general adult medical examination without abnormal findings: Secondary | ICD-10-CM | POA: Diagnosis not present

## 2022-07-27 DIAGNOSIS — E042 Nontoxic multinodular goiter: Secondary | ICD-10-CM

## 2022-07-27 DIAGNOSIS — G4733 Obstructive sleep apnea (adult) (pediatric): Secondary | ICD-10-CM

## 2022-07-27 DIAGNOSIS — H9190 Unspecified hearing loss, unspecified ear: Secondary | ICD-10-CM | POA: Insufficient documentation

## 2022-07-27 DIAGNOSIS — Z125 Encounter for screening for malignant neoplasm of prostate: Secondary | ICD-10-CM | POA: Diagnosis not present

## 2022-07-27 DIAGNOSIS — I10 Essential (primary) hypertension: Secondary | ICD-10-CM

## 2022-07-27 LAB — COMPREHENSIVE METABOLIC PANEL
ALT: 19 U/L (ref 0–53)
AST: 20 U/L (ref 0–37)
Albumin: 4.2 g/dL (ref 3.5–5.2)
Alkaline Phosphatase: 43 U/L (ref 39–117)
BUN: 16 mg/dL (ref 6–23)
CO2: 28 mEq/L (ref 19–32)
Calcium: 9.2 mg/dL (ref 8.4–10.5)
Chloride: 105 mEq/L (ref 96–112)
Creatinine, Ser: 1.53 mg/dL — ABNORMAL HIGH (ref 0.40–1.50)
GFR: 44.11 mL/min — ABNORMAL LOW (ref >60.00)
Glucose, Bld: 92 mg/dL (ref 70–99)
Potassium: 4.7 mEq/L (ref 3.5–5.1)
Sodium: 140 mEq/L (ref 135–145)
Total Bilirubin: 1.1 mg/dL (ref 0.2–1.2)
Total Protein: 6.8 g/dL (ref 6.0–8.3)

## 2022-07-27 LAB — CBC WITH DIFFERENTIAL/PLATELET
Basophils Absolute: 0 10*3/uL (ref 0.0–0.1)
Basophils Relative: 0.5 % (ref 0.0–3.0)
Eosinophils Absolute: 0 10*3/uL (ref 0.0–0.7)
Eosinophils Relative: 1 % (ref 0.0–5.0)
HCT: 38.4 % — ABNORMAL LOW (ref 39.0–52.0)
Hemoglobin: 13.1 g/dL (ref 13.0–17.0)
Lymphocytes Relative: 33.2 % (ref 12.0–46.0)
Lymphs Abs: 1.4 10*3/uL (ref 0.7–4.0)
MCHC: 34.2 g/dL (ref 30.0–36.0)
MCV: 89.2 fl (ref 78.0–100.0)
Monocytes Absolute: 0.3 10*3/uL (ref 0.1–1.0)
Monocytes Relative: 6.8 % (ref 3.0–12.0)
Neutro Abs: 2.5 10*3/uL (ref 1.4–7.7)
Neutrophils Relative %: 58.5 % (ref 43.0–77.0)
Platelets: 222 10*3/uL (ref 150.0–400.0)
RBC: 4.31 Mil/uL (ref 4.22–5.81)
RDW: 14.9 % (ref 11.5–15.5)
WBC: 4.3 10*3/uL (ref 4.0–10.5)

## 2022-07-27 LAB — VITAMIN D 25 HYDROXY (VIT D DEFICIENCY, FRACTURES): VITD: 29.05 ng/mL — ABNORMAL LOW (ref 30.00–100.00)

## 2022-07-27 LAB — LIPID PANEL
Cholesterol: 163 mg/dL (ref 0–200)
HDL: 53.4 mg/dL (ref >39.00)
LDL Cholesterol: 97 mg/dL (ref 0–99)
NonHDL: 109.56
Total CHOL/HDL Ratio: 3
Triglycerides: 62 mg/dL (ref 0.0–149.0)
VLDL: 12.4 mg/dL (ref 0.0–40.0)

## 2022-07-27 LAB — PHOSPHORUS: Phosphorus: 2.9 mg/dL (ref 2.3–4.6)

## 2022-07-27 LAB — MICROALBUMIN / CREATININE URINE RATIO
Creatinine,U: 118.2 mg/dL
Microalb Creat Ratio: 0.6 mg/g (ref 0.0–30.0)
Microalb, Ur: <0.7 mg/dL (ref 0.0–1.9)

## 2022-07-27 LAB — PSA: PSA: 1.55 ng/mL (ref 0.10–4.00)

## 2022-07-27 LAB — TSH: TSH: 1.06 u[IU]/mL (ref 0.35–5.50)

## 2022-07-27 MED ORDER — LOSARTAN POTASSIUM 50 MG PO TABS: 50.0000 mg | ORAL_TABLET | Freq: Every day | ORAL | 4 refills | Status: DC

## 2022-07-27 NOTE — Assessment & Plan Note (Signed)
Update TSH today. 

## 2022-07-27 NOTE — Assessment & Plan Note (Signed)
Bilateral, wears hearing aide PRN.  Planning to return to see audiology this year for recheck.

## 2022-07-27 NOTE — Assessment & Plan Note (Signed)
Chronic, stable on current regimen.  

## 2022-07-27 NOTE — Assessment & Plan Note (Signed)
Advanced directive discussion - does not have at home. Packet previously provided. Wife then children would be HCPOA. Full code.

## 2022-07-27 NOTE — Assessment & Plan Note (Signed)

## 2022-07-27 NOTE — Progress Notes (Signed)
Ph: [phone]       Fax: [phone]   Patient ID: [name], male    DOB: 1945/06/20, 77 y.o.   MRN: [id number]  This visit was conducted in person.  BP 126/66   Pulse 66   Temp (!) 97.5 F (36.4 C) (Temporal)   Ht 5' 10.25" (1.784 m)   Wt 205 lb 8 oz (93.2 kg)   SpO2 99%   BMI 29.28 kg/m    CC: AMW Subjective:   HPI: [name] is a 77 y.o. male presenting on 07/27/2022 for Medicare Wellness   Did not see health advisor this year.   Hearing Screening        Right ear 0 0 0 0  Left ear 40 40 25 0  Comments: Pt wears B hearing aids. Not wearing at today's OV. Also, c/o h/o tinnitus.   Vision Screening - Comments:: Last eye exam, 04/2022.  Flowsheet Row Office Visit from 07/27/2022 in [facility]  PHQ-2 Total Score 0     Wears bilateral hearing aides for hearing loss R>L - notes chronic tinnitus. Uses PRN, not wearing today. Followed at [facility].     07/27/2022   10:46 AM 06/22/2022    3:06 PM 07/21/2021   10:43 AM 07/18/2020   10:59 AM 11/26/2016    2:41 PM  Fall Risk   Falls in the past year? 0 0 0 1 No  Number falls in past yr:  0  0   Injury with Fall?  0  0   Risk for fall due to :  No Fall Risks     Follow up  Falls evaluation completed      S/p cataract extraction bilaterally 04/2022.  Sister with Alz passed away last year 2021-08-13.  OSA - on CPAP.    Preventative: COLONOSCOPY 05/2021 - diverticulosis o/w normal (Danis), no f/u planned Prostate cancer screening - yearly PSA, nocturia x1-2. No fmhx prostate cancer Lung cancer screening - not eligible  Flu shot - yearly  COVID shot - Pfizer 06/2019, 08-14-2019, booster 02/2020, bivalent 12/2020, 02/2022 Tdap 13-Aug-2009 Pneumovax 08/13/2012, prevnar-13 2015-08-14 RSV - discussed  Shingrix - 04/2019, 08/2019  Advanced directive discussion - does not have at home. Packet previously provided. Wife then children would be HCPOA. Full code.  Seat belt  use discussed.  Sunscreen use discussed. No changing moles on skin. 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 [address] many years in [place] Lives with wife.  Occ: retired, was Civil Service fast streamer Diet: good water, fruits/vegetables daily  Activity: regular walking daily      Relevant past medical, surgical, family and social history reviewed and updated as indicated. Interim medical history since our last visit reviewed. Allergies and medications reviewed and updated. Outpatient Medications Prior to Visit  Medication Sig Dispense Refill   acetaminophen (TYLENOL) 500 MG tablet Take 500 mg by mouth every 6 (six) hours as needed for moderate pain.     cholecalciferol (VITAMIN D) 1000 UNITS tablet Take 1,000 Units by mouth daily. Patient takes 3 times a week     famotidine (PEPCID) 20 MG tablet Take 1 tablet (20 mg total) by mouth 2 (two) times daily. 30 tablet  0   losartan (COZAAR) 50 MG tablet Take 50 mg by mouth daily.     amLODipine (NORVASC) 10 MG tablet Take 10 mg by mouth daily.     No facility-administered medications prior to visit.     Per HPI unless specifically indicated in ROS section below Review of Systems  Constitutional:  Negative for activity change, appetite change, chills, fatigue, fever and unexpected weight change.  HENT:  Negative for hearing loss.   Eyes:  Negative for visual disturbance.  Respiratory:  Negative for cough, chest tightness, shortness of breath and wheezing.   Cardiovascular:  Negative for chest pain, palpitations and leg swelling.  Gastrointestinal:  Negative for abdominal distention, abdominal pain, blood in stool, constipation, diarrhea, nausea and vomiting.  Genitourinary:  Negative for difficulty urinating and hematuria.   Musculoskeletal:  Negative for arthralgias, myalgias and neck pain.  Skin:  Negative for rash.  Neurological:  Negative for dizziness, seizures, syncope and headaches.  Hematological:  Negative for adenopathy. Does not bruise/bleed easily.  Psychiatric/Behavioral:  Negative for dysphoric mood. The patient is not nervous/anxious.     Objective:  BP 126/66   Pulse 66   Temp (!) 97.5 F (36.4 C) (Temporal)   Ht 5' 10.25" (1.784 m)   Wt 205 lb 8 oz (93.2 kg)   SpO2 99%   BMI 29.28 kg/m   Wt Readings from Last 3 Encounters:  07/27/22 205 lb 8 oz (93.2 kg)  06/22/22 206 lb (93.4 kg)  05/11/22 205 lb (93 kg)      Physical Exam Vitals and nursing note reviewed.  Constitutional:      General: He is not in acute distress.    Appearance: Normal appearance. He is well-developed. He is not ill-appearing.  HENT:     Head: Normocephalic and atraumatic.     Right Ear: Hearing, tympanic membrane, ear canal and external ear normal.     Left Ear: Hearing, tympanic membrane, ear canal and external ear normal.     Mouth/Throat:     Mouth: Mucous membranes are moist.     Pharynx: Oropharynx is clear. No oropharyngeal exudate or posterior oropharyngeal erythema.  Eyes:     General: No scleral icterus.    Extraocular Movements: Extraocular movements intact.     Conjunctiva/sclera: Conjunctivae normal.     Pupils: Pupils are equal, round, and reactive to light.  Neck:     Thyroid: No thyroid mass or thyromegaly.     Vascular: No carotid bruit.  Cardiovascular:     Rate and Rhythm: Normal rate and regular rhythm.     Pulses: Normal pulses.          Radial pulses are 2+ on the right side and 2+ on the left side.     Heart sounds: Normal heart sounds. No murmur heard. Pulmonary:     Effort: Pulmonary effort is normal. No respiratory distress.     Breath sounds: Normal breath sounds. No wheezing, rhonchi or rales.  Abdominal:     General: Bowel sounds are normal. There is no distension.      Palpations: Abdomen is soft. There is no mass.     Tenderness: There is no abdominal tenderness. There is no guarding or rebound.     Hernia: No hernia is present.  Musculoskeletal:        General: Normal range of motion.     Cervical back: Normal range of motion and neck supple.     Right lower leg: No edema.  Left lower leg: No edema.  Lymphadenopathy:     Cervical: No cervical adenopathy.  Skin:    General: Skin is warm and dry.     Findings: No rash.  Neurological:     General: No focal deficit present.     Mental Status: He is alert and oriented to person, place, and time.     Comments:  Recall 3/3 Calculation 5/5 DLROW  Psychiatric:        Mood and Affect: Mood normal.        Behavior: Behavior normal.        Thought Content: Thought content normal.        Judgment: Judgment normal.       Results for orders placed or performed in visit on 07/23/21  CBC with Differential/Platelet  Result Value Ref Range   WBC 4.3 4.0 - 10.5 K/uL   RBC 4.24 4.22 - 5.81 Mil/uL   Hemoglobin 13.2 13.0 - 17.0 g/dL   HCT 46.9 (L) 62.9 - 52.8 %   MCV 89.7 78.0 - 100.0 fl   MCHC 34.7 30.0 - 36.0 g/dL   RDW 41.3 24.4 - 01.0 %   Platelets 192.0 150.0 - 400.0 K/uL   Neutrophils Relative % 50.1 43.0 - 77.0 %   Lymphocytes Relative 39.9 12.0 - 46.0 %   Monocytes Relative 7.8 3.0 - 12.0 %   Eosinophils Relative 2.0 0.0 - 5.0 %   Basophils Relative 0.2 0.0 - 3.0 %   Neutro Abs 2.2 1.4 - 7.7 K/uL   Lymphs Abs 1.7 0.7 - 4.0 K/uL   Monocytes Absolute 0.3 0.1 - 1.0 K/uL   Eosinophils Absolute 0.1 0.0 - 0.7 K/uL   Basophils Absolute 0.0 0.0 - 0.1 K/uL  VITAMIN D 25 Hydroxy (Vit-D Deficiency, Fractures)  Result Value Ref Range   VITD 32.94 30.00 - 100.00 ng/mL  TSH  Result Value Ref Range   TSH 1.61 0.35 - 5.50 uIU/mL  PSA, Medicare  Result Value Ref Range   PSA 1.27 0.10 - 4.00 ng/ml  Comprehensive metabolic panel  Result Value Ref Range   Sodium 140 135 - 145 mEq/L   Potassium 4.5 3.5  - 5.1 mEq/L   Chloride 105 96 - 112 mEq/L   CO2 30 19 - 32 mEq/L   Glucose, Bld 90 70 - 99 mg/dL   BUN 20 6 - 23 mg/dL   Creatinine, Ser 2.72 (H) 0.40 - 1.50 mg/dL   Total Bilirubin 1.1 0.2 - 1.2 mg/dL   Alkaline Phosphatase 39 39 - 117 U/L   AST 19 0 - 37 U/L   ALT 18 0 - 53 U/L   Total Protein 6.6 6.0 - 8.3 g/dL   Albumin 4.2 3.5 - 5.2 g/dL   GFR 53.66 (L) >44.03 mL/min   Calcium 9.1 8.4 - 10.5 mg/dL  Lipid panel  Result Value Ref Range   Cholesterol 167 0 - 200 mg/dL   Triglycerides 47.4 0.0 - 149.0 mg/dL   HDL 25.95 >63.87 mg/dL   VLDL 56.4 0.0 - 33.2 mg/dL   LDL Cholesterol 98 0 - 99 mg/dL   Total CHOL/HDL Ratio 3    NonHDL 112.74     Assessment & Plan:   Problem List Items Addressed This Visit     Medicare annual wellness visit, subsequent - Primary (Chronic)    I have personally reviewed the Medicare Annual Wellness questionnaire and have noted 1. The patient's medical and social history 2. Their use of alcohol, tobacco or illicit  drugs 3. Their current medications and supplements 4. The patient's functional ability including ADL's, fall risks, home safety risks and hearing or visual impairment. Cognitive function has been assessed and addressed as indicated.  5. Diet and physical activity 6. Evidence for depression or mood disorders The patients weight, height, BMI have been recorded in the chart. I have made referrals, counseling and provided education to the patient based on review of the above and I have provided the pt with a written personalized care plan for preventive services. Provider list updated.. See scanned questionairre as needed for further documentation. Reviewed preventative protocols and updated unless pt declined.       Health maintenance examination (Chronic)    Preventative protocols reviewed and updated unless pt declined. Discussed healthy diet and lifestyle.       Advanced directives, counseling/discussion (Chronic)    Advanced  directive discussion - does not have at home. Packet previously provided. Wife then children would be HCPOA. Full code.       Primary hypertension    Chronic, stable on current regimen.       Relevant Medications   losartan (COZAAR) 50 MG tablet   CKD (chronic kidney disease) stage 3, GFR 30-59 ml/min    Update labs - continues losartan  daily       Relevant Orders   Lipid panel   Comprehensive metabolic panel   Phosphorus   VITAMIN D 25 Hydroxy (Vit-D Deficiency, Fractures)   Microalbumin / creatinine urine ratio   Parathyroid hormone, intact (no Ca)   CBC with Differential/Platelet   OSA on CPAP    Continues CPAP nightly.       Multiple thyroid nodules    Update TSH today.      Relevant Orders   TSH   Hearing loss    Bilateral, wears hearing aide PRN.  Planning to return to see audiology this year for recheck.       Other Visit Diagnoses     Special screening for malignant neoplasm of prostate       Relevant Orders   PSA        Meds ordered this encounter  Medications   losartan (COZAAR) 50 MG tablet    Sig: Take 1 tablet (50 mg total) by mouth daily.    Dispense:  90 tablet    Refill:  4    Orders Placed This Encounter  Procedures   Lipid panel   Comprehensive metabolic panel   Phosphorus   VITAMIN D 25 Hydroxy (Vit-D Deficiency, Fractures)   Microalbumin / creatinine urine ratio   Parathyroid hormone, intact (no Ca)   CBC with Differential/Platelet   PSA   TSH    Patient Instructions  Labs today  Continue losartan - refilled today.  You are doing well today  Return as needed or in 1 year for next physical.   Follow up plan: Return in about 1 year (around 07/27/2023) for annual exam, prior fasting for blood work, medicare wellness visit.  Eustaquio Boyden, MD

## 2022-07-27 NOTE — Assessment & Plan Note (Signed)
Preventative protocols reviewed and updated unless pt declined. Discussed healthy diet and lifestyle.  

## 2022-07-27 NOTE — Assessment & Plan Note (Addendum)
Update labs - continues losartan 50mg  daily

## 2022-07-27 NOTE — Assessment & Plan Note (Signed)
Continues CPAP nightly.  

## 2022-07-27 NOTE — Patient Instructions (Addendum)
Labs today  Continue losartan - refilled today.  You are doing well today  Return as needed or in 1 year for next physical.

## 2022-07-28 ENCOUNTER — Encounter: Payer: Self-pay | Admitting: Family Medicine

## 2022-07-28 ENCOUNTER — Other Ambulatory Visit: Payer: Self-pay | Admitting: Family Medicine

## 2022-07-28 DIAGNOSIS — E559 Vitamin D deficiency, unspecified: Secondary | ICD-10-CM | POA: Insufficient documentation

## 2022-07-28 LAB — PARATHYROID HORMONE, INTACT (NO CA): PTH: 72 pg/mL (ref 16–77)

## 2022-07-28 MED ORDER — VITAMIN D3 25 MCG (1000 UT) PO CAPS: 1.0000 | ORAL_CAPSULE | Freq: Every day | ORAL | Status: AC

## 2022-08-07 IMAGING — US US RENAL
1 series · 13 of 25 positions shown · non-contrast
Comparison: Hip radiographs, 06/02/2017

CLINICAL DATA: CKD

EXAM:
RENAL / URINARY TRACT ULTRASOUND COMPLETE

[Series 1: us renal · 0.22mm/px · 13 of 44 slices shown]
[im 1/44]
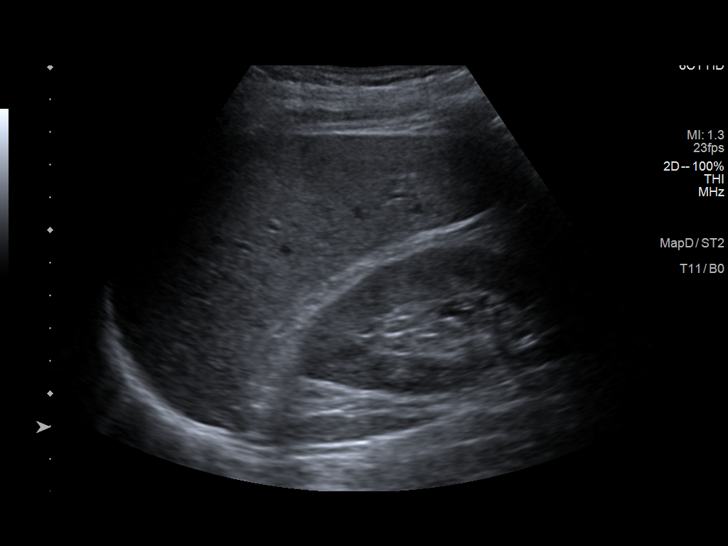
[im 4/44]
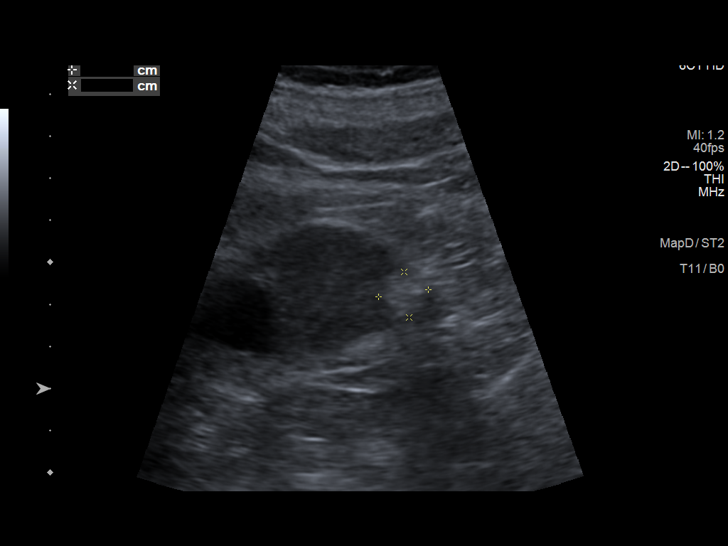
[im 8/44]
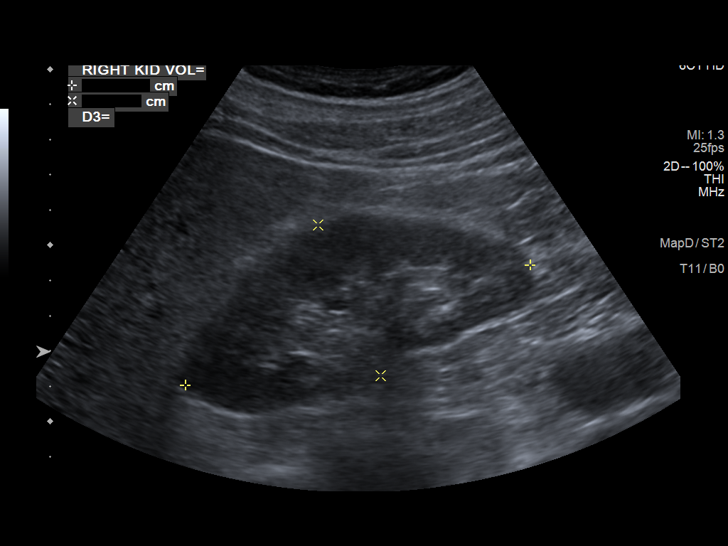
[im 11/44]
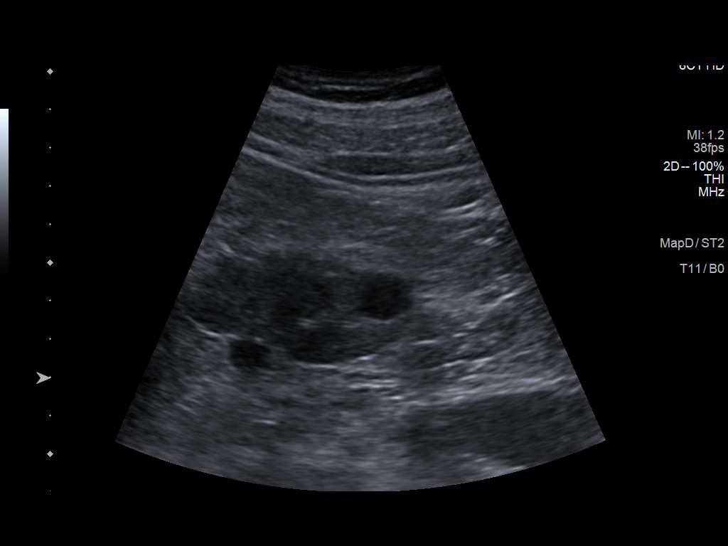
[im 15/44]
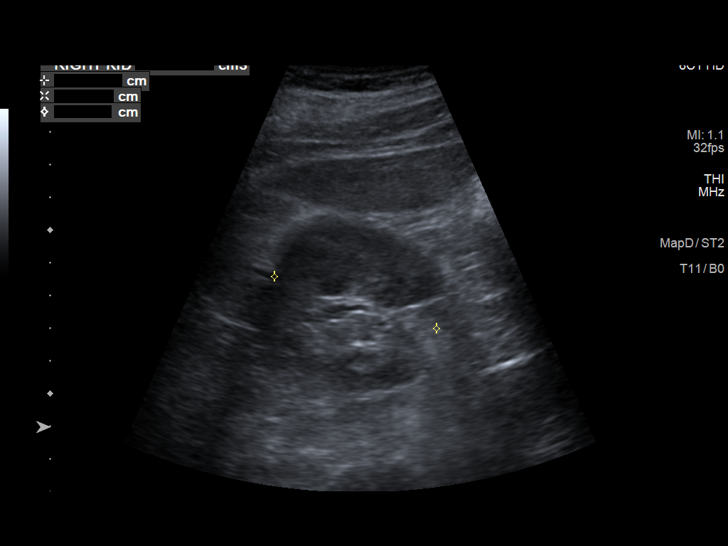
[im 18/44]
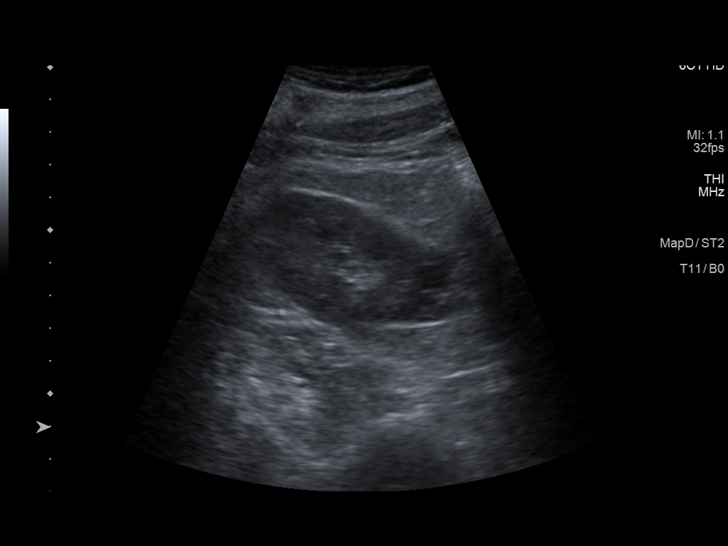
[im 22/44]
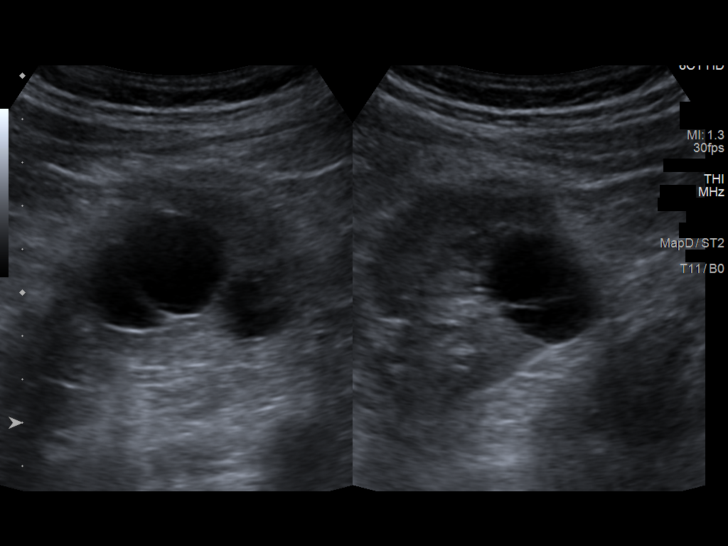
[im 26/44]
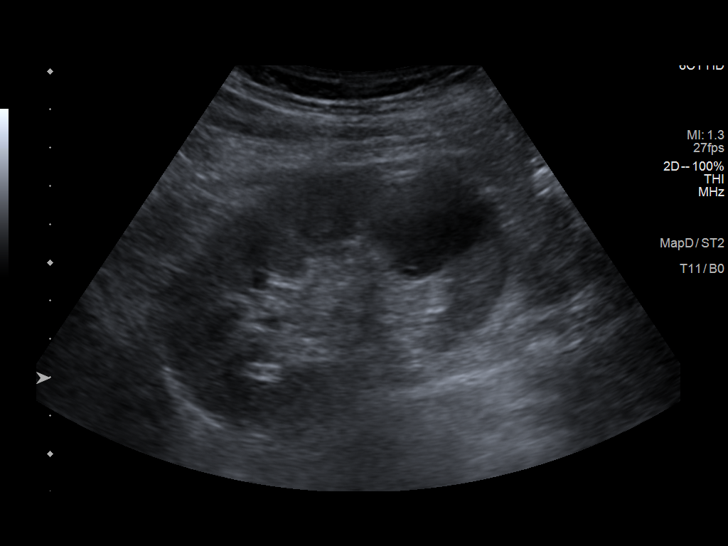
[im 29/44]
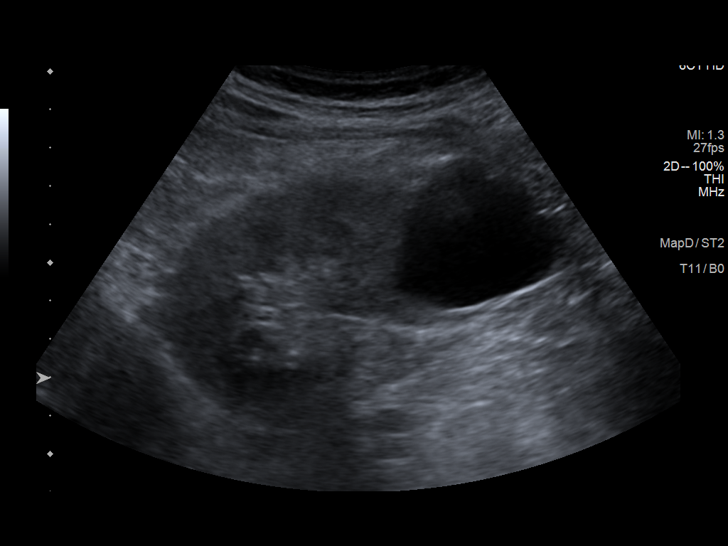
[im 33/44]
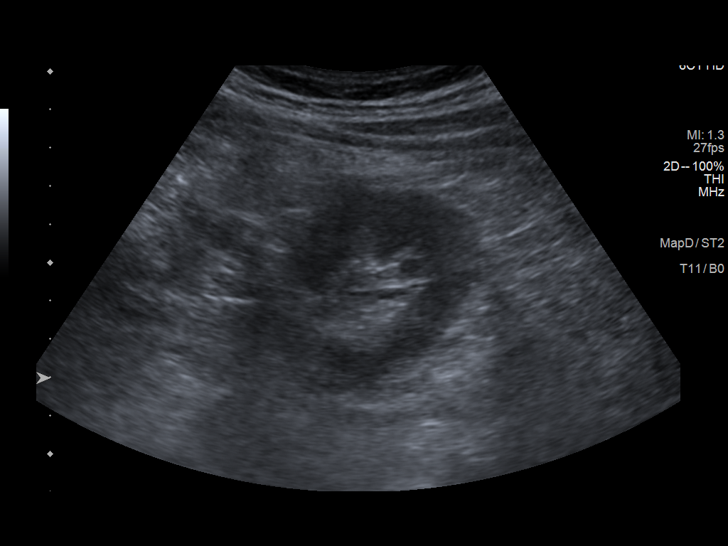
[im 36/44]
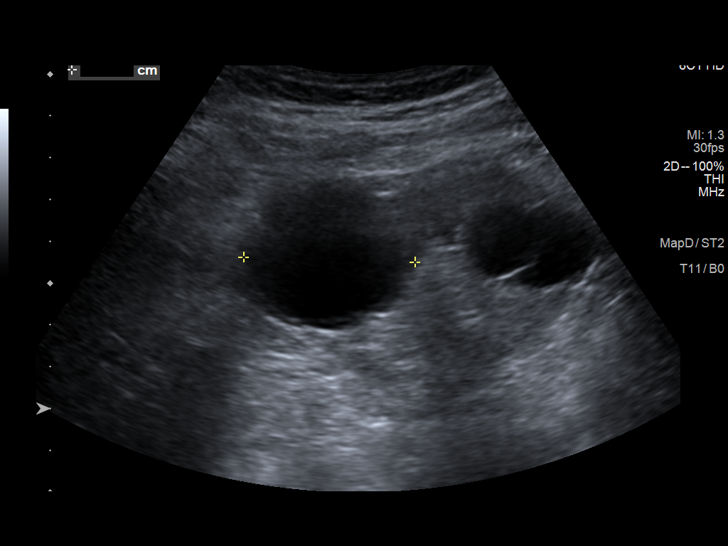
[im 40/44]
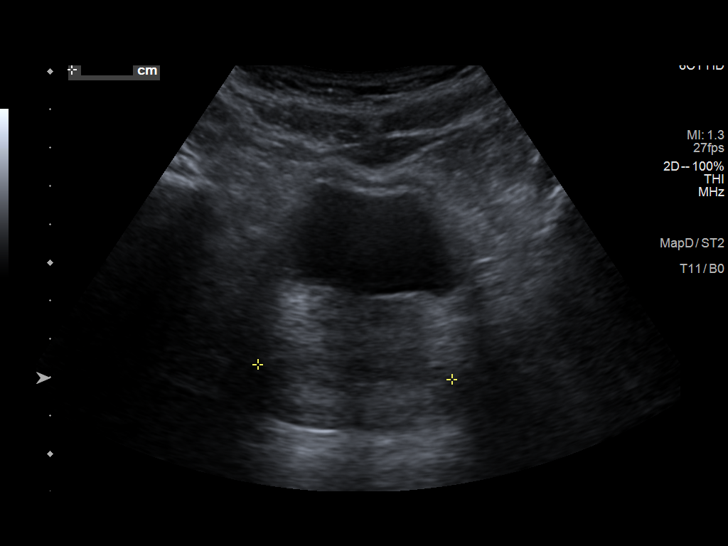
[im 44/44]
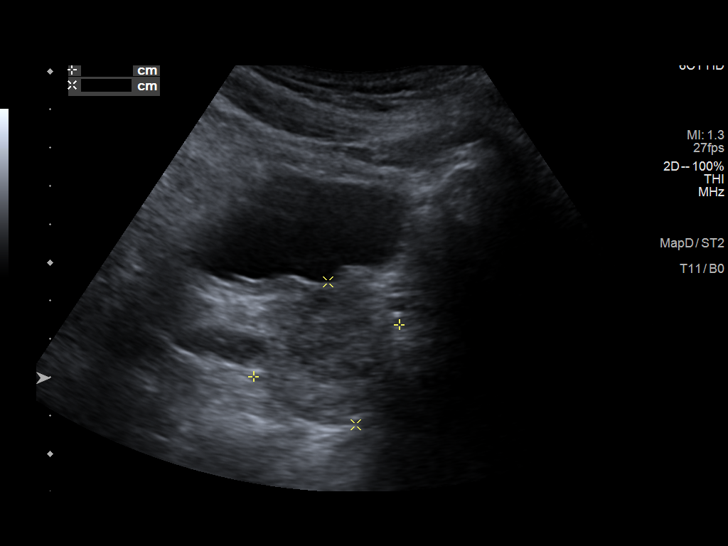

[13 of 25 positions shown; findings below may reference images not displayed]

FINDINGS: Right Kidney:

Renal measurements: 10.4 x 4.6 x 5.2 = volume: 131 mL. Echogenicity
within normal limits. No hydronephrosis.

Round, anechoic midpole lesion with increase through transmission
and without Doppler flow, measuring proximally 1.9 x 1.8 cm,
consistent with a simple renal cyst.

Additional round homogeneously dense inferior pole lesion without
demonstrable flow, measuring proximally 1.2 x 0.9 cm is
indeterminate.

Left Kidney:

Renal measurements: 9.6 x 5.6 x 5.0 = volume: 139 mL. Echogenicity
within normal limits. No hydronephrosis .

Multiple, round anechoic endophytic and exophytic lesions with
increased through transmission and without demonstrated both Doppler
flow, measuring 3.5 x 2.5 cm, 4.2 x 4.1 cm are consistent with
simple and minimally complex renal cysts.

Bladder:

Appears normal for degree of bladder distention.

Other:

No intra-abdominal ascites.
IMPRESSION: 1. No hydronephrosis.
2. Bilateral renal cysts, largest measuring up to 4.2 cm on the
LEFT.
3. 1.2 cm indeterminate RIGHT renal lesion (image 5/47). Further
evaluation with nonemergent, outpatient multiphasic CT or MRI should
be considered.

## 2022-09-21 DIAGNOSIS — G4733 Obstructive sleep apnea (adult) (pediatric): Secondary | ICD-10-CM | POA: Diagnosis not present

## 2022-12-10 DIAGNOSIS — Z87891 Personal history of nicotine dependence: Secondary | ICD-10-CM | POA: Diagnosis not present

## 2022-12-10 DIAGNOSIS — G4733 Obstructive sleep apnea (adult) (pediatric): Secondary | ICD-10-CM | POA: Diagnosis not present

## 2022-12-10 DIAGNOSIS — Z8249 Family history of ischemic heart disease and other diseases of the circulatory system: Secondary | ICD-10-CM | POA: Diagnosis not present

## 2022-12-10 DIAGNOSIS — I129 Hypertensive chronic kidney disease with stage 1 through stage 4 chronic kidney disease, or unspecified chronic kidney disease: Secondary | ICD-10-CM | POA: Diagnosis not present

## 2022-12-10 DIAGNOSIS — Z818 Family history of other mental and behavioral disorders: Secondary | ICD-10-CM | POA: Diagnosis not present

## 2022-12-10 DIAGNOSIS — Z823 Family history of stroke: Secondary | ICD-10-CM | POA: Diagnosis not present

## 2022-12-10 DIAGNOSIS — H269 Unspecified cataract: Secondary | ICD-10-CM | POA: Diagnosis not present

## 2022-12-10 DIAGNOSIS — Z809 Family history of malignant neoplasm, unspecified: Secondary | ICD-10-CM | POA: Diagnosis not present

## 2022-12-10 DIAGNOSIS — Z886 Allergy status to analgesic agent status: Secondary | ICD-10-CM | POA: Diagnosis not present

## 2022-12-10 DIAGNOSIS — N189 Chronic kidney disease, unspecified: Secondary | ICD-10-CM | POA: Diagnosis not present

## 2023-03-25 DIAGNOSIS — G4733 Obstructive sleep apnea (adult) (pediatric): Secondary | ICD-10-CM | POA: Diagnosis not present

## 2023-07-13 ENCOUNTER — Telehealth: Payer: Self-pay | Admitting: Pulmonary Disease

## 2023-07-13 NOTE — Telephone Encounter (Signed)
 Rec'd fax from [facility] for new CPAP. PT not seen for over 3 years. Will fax back to [facility] and shred.

## 2023-07-17 ENCOUNTER — Other Ambulatory Visit: Payer: Self-pay | Admitting: Family Medicine

## 2023-07-17 DIAGNOSIS — E042 Nontoxic multinodular goiter: Secondary | ICD-10-CM

## 2023-07-17 DIAGNOSIS — Z125 Encounter for screening for malignant neoplasm of prostate: Secondary | ICD-10-CM

## 2023-07-17 DIAGNOSIS — E559 Vitamin D deficiency, unspecified: Secondary | ICD-10-CM

## 2023-07-17 DIAGNOSIS — N1832 Chronic kidney disease, stage 3b: Secondary | ICD-10-CM

## 2023-07-21 ENCOUNTER — Other Ambulatory Visit: Payer: Self-pay | Admitting: Family Medicine

## 2023-07-21 ENCOUNTER — Other Ambulatory Visit (INDEPENDENT_AMBULATORY_CARE_PROVIDER_SITE_OTHER): Payer: Medicare HMO

## 2023-07-21 DIAGNOSIS — N1832 Chronic kidney disease, stage 3b: Secondary | ICD-10-CM | POA: Diagnosis not present

## 2023-07-21 DIAGNOSIS — Z125 Encounter for screening for malignant neoplasm of prostate: Secondary | ICD-10-CM | POA: Diagnosis not present

## 2023-07-21 DIAGNOSIS — Z7689 Persons encountering health services in other specified circumstances: Secondary | ICD-10-CM | POA: Diagnosis not present

## 2023-07-21 DIAGNOSIS — E042 Nontoxic multinodular goiter: Secondary | ICD-10-CM

## 2023-07-21 DIAGNOSIS — E559 Vitamin D deficiency, unspecified: Secondary | ICD-10-CM | POA: Diagnosis not present

## 2023-07-21 LAB — LIPID PANEL
Cholesterol: 154 mg/dL (ref 0–200)
HDL: 47.5 mg/dL (ref >39.00)
LDL Cholesterol: 94 mg/dL (ref 0–99)
NonHDL: 106.85
Total CHOL/HDL Ratio: 3
Triglycerides: 62 mg/dL (ref 0.0–149.0)
VLDL: 12.4 mg/dL (ref 0.0–40.0)

## 2023-07-21 LAB — CBC WITH DIFFERENTIAL/PLATELET
Basophils Absolute: 0 10*3/uL (ref 0.0–0.1)
Basophils Relative: 0.5 % (ref 0.0–3.0)
Eosinophils Absolute: 0.1 10*3/uL (ref 0.0–0.7)
Eosinophils Relative: 1.7 % (ref 0.0–5.0)
HCT: 38.1 % — ABNORMAL LOW (ref 39.0–52.0)
Hemoglobin: 13.1 g/dL (ref 13.0–17.0)
Lymphocytes Relative: 33.1 % (ref 12.0–46.0)
Lymphs Abs: 1.6 10*3/uL (ref 0.7–4.0)
MCHC: 34.5 g/dL (ref 30.0–36.0)
MCV: 89.6 fl (ref 78.0–100.0)
Monocytes Absolute: 0.4 10*3/uL (ref 0.1–1.0)
Monocytes Relative: 7.5 % (ref 3.0–12.0)
Neutro Abs: 2.7 10*3/uL (ref 1.4–7.7)
Neutrophils Relative %: 57.2 % (ref 43.0–77.0)
Platelets: 204 10*3/uL (ref 150.0–400.0)
RBC: 4.25 Mil/uL (ref 4.22–5.81)
RDW: 14.1 % (ref 11.5–15.5)
WBC: 4.8 10*3/uL (ref 4.0–10.5)

## 2023-07-21 LAB — COMPREHENSIVE METABOLIC PANEL WITH GFR
ALT: 16 U/L (ref 0–53)
AST: 19 U/L (ref 0–37)
Albumin: 4.2 g/dL (ref 3.5–5.2)
Alkaline Phosphatase: 43 U/L (ref 39–117)
BUN: 13 mg/dL (ref 6–23)
CO2: 27 meq/L (ref 19–32)
Calcium: 8.8 mg/dL (ref 8.4–10.5)
Chloride: 108 meq/L (ref 96–112)
Creatinine, Ser: 1.5 mg/dL (ref 0.40–1.50)
GFR: 44.86 mL/min — ABNORMAL LOW (ref >60.00)
Glucose, Bld: 96 mg/dL (ref 70–99)
Potassium: 4.3 meq/L (ref 3.5–5.1)
Sodium: 141 meq/L (ref 135–145)
Total Bilirubin: 0.9 mg/dL (ref 0.2–1.2)
Total Protein: 6.6 g/dL (ref 6.0–8.3)

## 2023-07-21 LAB — MICROALBUMIN / CREATININE URINE RATIO
Creatinine,U: 168.3 mg/dL
Microalb Creat Ratio: 6.4 mg/g (ref 0.0–30.0)
Microalb, Ur: 1.1 mg/dL (ref 0.0–1.9)

## 2023-07-21 LAB — TSH: TSH: 1.43 u[IU]/mL (ref 0.35–5.50)

## 2023-07-21 LAB — PHOSPHORUS: Phosphorus: 2.5 mg/dL (ref 2.3–4.6)

## 2023-07-21 LAB — VITAMIN D 25 HYDROXY (VIT D DEFICIENCY, FRACTURES): VITD: 27.39 ng/mL — ABNORMAL LOW (ref 30.00–100.00)

## 2023-07-21 LAB — PSA: PSA: 1.47 ng/mL (ref 0.10–4.00)

## 2023-07-24 LAB — PARATHYROID HORMONE, INTACT (NO CA): PTH: 84 pg/mL — ABNORMAL HIGH (ref 15–65)

## 2023-07-24 LAB — SPECIMEN STATUS REPORT

## 2023-07-27 NOTE — Telephone Encounter (Signed)
 NFN

## 2023-07-28 ENCOUNTER — Ambulatory Visit: Payer: Medicare HMO | Admitting: Family Medicine

## 2023-07-28 ENCOUNTER — Encounter: Payer: Self-pay | Admitting: Family Medicine

## 2023-07-28 VITALS — BP 138/82 | HR 57 | Temp 98.2°F | Ht 70.5 in | Wt 209.2 lb

## 2023-07-28 DIAGNOSIS — N2581 Secondary hyperparathyroidism of renal origin: Secondary | ICD-10-CM | POA: Diagnosis not present

## 2023-07-28 DIAGNOSIS — G4733 Obstructive sleep apnea (adult) (pediatric): Secondary | ICD-10-CM | POA: Diagnosis not present

## 2023-07-28 DIAGNOSIS — D1771 Benign lipomatous neoplasm of kidney: Secondary | ICD-10-CM

## 2023-07-28 DIAGNOSIS — E042 Nontoxic multinodular goiter: Secondary | ICD-10-CM

## 2023-07-28 DIAGNOSIS — I10 Essential (primary) hypertension: Secondary | ICD-10-CM | POA: Diagnosis not present

## 2023-07-28 DIAGNOSIS — Z0000 Encounter for general adult medical examination without abnormal findings: Secondary | ICD-10-CM

## 2023-07-28 DIAGNOSIS — H9313 Tinnitus, bilateral: Secondary | ICD-10-CM

## 2023-07-28 DIAGNOSIS — E559 Vitamin D deficiency, unspecified: Secondary | ICD-10-CM | POA: Diagnosis not present

## 2023-07-28 DIAGNOSIS — N281 Cyst of kidney, acquired: Secondary | ICD-10-CM

## 2023-07-28 DIAGNOSIS — N1832 Chronic kidney disease, stage 3b: Secondary | ICD-10-CM | POA: Diagnosis not present

## 2023-07-28 DIAGNOSIS — Z7189 Other specified counseling: Secondary | ICD-10-CM | POA: Diagnosis not present

## 2023-07-28 DIAGNOSIS — H9193 Unspecified hearing loss, bilateral: Secondary | ICD-10-CM

## 2023-07-28 LAB — POC URINALSYSI DIPSTICK (AUTOMATED)
Bilirubin, UA: NEGATIVE
Blood, UA: NEGATIVE
Glucose, UA: NEGATIVE
Ketones, UA: NEGATIVE
Leukocytes, UA: NEGATIVE
Nitrite, UA: NEGATIVE
Protein, UA: NEGATIVE
Spec Grav, UA: 1.015 (ref 1.010–1.025)
Urobilinogen, UA: 0.2 U/dL
pH, UA: 6 (ref 5.0–8.0)

## 2023-07-28 MED ORDER — LOSARTAN POTASSIUM 50 MG PO TABS: 50.0000 mg | ORAL_TABLET | Freq: Every day | ORAL | 4 refills | Status: AC

## 2023-07-28 NOTE — Assessment & Plan Note (Signed)
 Preventative protocols reviewed and updated unless pt declined. Discussed healthy diet and lifestyle.

## 2023-07-28 NOTE — Assessment & Plan Note (Addendum)
 Advanced directive discussion - does not have at home. Packet previously provided. Wife then children would be HCPOA. Full code. Planning to meet with attorney.

## 2023-07-28 NOTE — Patient Instructions (Addendum)
 Urinalysis today  Let us  know if you need new referral to see sleep doctors [name]).  Bring me records of shots done at pharmacy from last year.  Bring me a copy of your advanced directive.  Good to see you today Return as needed or in 1 year for next physical.

## 2023-07-28 NOTE — Progress Notes (Unsigned)
 Ph: [phone] Fax: [phone]   Patient ID: [name], male    DOB: 11-02-1945, 78 y.o.   MRN: [id number]  This visit was conducted in person.  BP 138/82   Pulse (!) 57   Temp 98.2 F (36.8 C) (Oral)   Ht 5' 10.5" (1.791 m)   Wt 209 lb 4 oz (94.9 kg)   SpO2 97%   BMI 29.60 kg/m    CC: AMW/CPE Subjective:   HPI: [name] is a 78 y.o. male presenting on 07/28/2023 for Medicare Wellness (C/o ringing in B ears. )   Did not see health advisor this year.   Hearing Screening   500Hz  1000Hz  2000Hz  4000Hz   Right ear 0 25 0 40  Left ear 20 0 25 0  Comments: Wears B hearing aids. Not wearing at today's OV.   Vision Screening   Right eye Left eye Both eyes  Without correction 20/25 20/25 20/20   With correction      Wears hearing aides. Notes constant bilateral tinnitus.  Flowsheet Row Office Visit from 07/28/2023 in [facility]  PHQ-2 Total Score 0          07/28/2023   10:51 AM 07/27/2022   10:46 AM 06/22/2022    3:06 PM 07/21/2021   10:43 AM 07/18/2020   10:59 AM  Fall Risk   Falls in the past year? 0 0 0 0 1  Number falls in past yr:   0  0  Injury with Fall?   0  0  Risk for fall due to :   No Fall Risks    Follow up   Falls evaluation completed     Discussed recently discovered [facility] laboratory miscalculation - the UACR calculation in the software of the system was incorrect but the absolute levels of microalbumin and creatinine were correct.   S/p cataract extraction bilaterally 04/2022.  Sister with Alz passed away last year 2021-08-14.  OSA - on CPAP unsure pressure settings. Last saw [facility] pulmonology 15-Aug-2018.   Not regular with daily vitamin D  CKD stage 3 GFR 40s.  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  No h/o kidney stones Sister was born with 1 kidney Kidney function worsened 2020-08-14 - at that time was taking significant advil (for 25 yrs) for  migraines.    Preventative: COLONOSCOPY 05/2021 - diverticulosis o/w normal (Danis), no f/u planned Prostate cancer screening - yearly PSA, nocturia x1-2. No fmhx prostate cancer Lung cancer screening - not eligible  Flu shot - yearly  COVID shot - Pfizer 06/2019, 15-Aug-2019, booster 02/2020, bivalent 12/2020, 02/2022 Tdap 08-14-09 Pneumovax 08-14-12, prevnar-13 08/15/15, repeat pneumococcal 08-15-2022 at pharmacy will bring me records.  RSV - discussed  Shingrix - 04/2019, 08/2019  Advanced directive discussion - does not have at home. Packet previously provided. Wife then children would be HCPOA. [name]. Planning to meet with attorney.  Seat belt use discussed.  Sunscreen use discussed. No changing moles on skin. 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  Occ: retired, was [employer] fast streamer Diet: good water, fruits/vegetables daily  Activity: regular walking daily  Relevant past medical, surgical, family and social history reviewed and updated as indicated. Interim medical history since our last visit reviewed. Allergies and medications reviewed and updated. Outpatient Medications Prior to Visit  Medication Sig Dispense Refill   acetaminophen (TYLENOL) 500 MG tablet Take 500 mg by mouth every 6 (six) hours as needed for moderate pain.     Cholecalciferol (VITAMIN D3) 25 MCG (1000 UT) CAPS Take 1 capsule (1,000 Units total) by mouth daily. 30 capsule    losartan (COZAAR) 50 MG tablet Take 1 tablet (50 mg total) by mouth daily. 90 tablet 4   No facility-administered medications prior to visit.     Per HPI unless specifically indicated in ROS section below Review of Systems  Constitutional:  Negative for activity change, appetite  change, chills, diaphoresis, fatigue, fever and unexpected weight change.       No bone pain, night sweats, swollen glands  HENT:  Negative for hearing loss.   Eyes:  Negative for visual disturbance.  Respiratory:  Negative for cough, chest tightness, shortness of breath and wheezing.   Cardiovascular:  Negative for chest pain, palpitations and leg swelling.  Gastrointestinal:  Negative for abdominal distention, abdominal pain, blood in stool, constipation, diarrhea, nausea and vomiting.  Genitourinary:  Negative for difficulty urinating and hematuria.  Musculoskeletal:  Negative for arthralgias, myalgias and neck pain.  Skin:  Negative for rash.  Neurological:  Negative for dizziness, seizures, syncope and headaches.  Hematological:  Negative for adenopathy. Does not bruise/bleed easily.  Psychiatric/Behavioral:  Negative for dysphoric mood. The patient is not nervous/anxious.     Objective:  BP 138/82   Pulse (!) 57   Temp 98.2 F (36.8 C) (Oral)   Ht 5' 10.5" (1.791 m)   Wt 209 lb 4 oz (94.9 kg)   SpO2 97%   BMI 29.60 kg/m   Wt Readings from Last 3 Encounters:  07/28/23 209 lb 4 oz (94.9 kg)  07/27/22 205 lb 8 oz (93.2 kg)  06/22/22 206 lb (93.4 kg)      Physical Exam Vitals and nursing note reviewed.  Constitutional:      General: He is not in acute distress.    Appearance: Normal appearance. He is well-developed. He is not ill-appearing.  HENT:     Head: Normocephalic and atraumatic.     Right Ear: Hearing, tympanic membrane, ear canal and external ear normal.     Left Ear: Hearing, tympanic membrane, ear canal and external ear normal.     Mouth/Throat:     Mouth: Mucous membranes are moist.     Pharynx: Oropharynx is clear. No oropharyngeal exudate or posterior oropharyngeal erythema.  Eyes:     General: No scleral icterus.    Extraocular Movements: Extraocular movements intact.     Conjunctiva/sclera: Conjunctivae normal.     Pupils: Pupils are equal, round, and  reactive to light.  Neck:     Thyroid: No thyroid mass or thyromegaly.     Vascular: No carotid bruit.  Cardiovascular:     Rate and Rhythm: Normal rate and regular rhythm.     Pulses: Normal pulses.          Radial pulses are 2+ on the right side and 2+ on the left side.     Heart sounds: Normal heart sounds. No murmur heard. Pulmonary:     Effort: Pulmonary effort is normal. No respiratory distress.     Breath sounds: Normal breath sounds. No wheezing, rhonchi or rales.  Abdominal:  General: Bowel sounds are normal. There is no distension.     Palpations: Abdomen is soft. There is no mass.     Tenderness: There is no abdominal tenderness. There is no guarding or rebound.     Hernia: No hernia is present.  Musculoskeletal:        General: Normal range of motion.     Cervical back: Normal range of motion and neck supple.     Right lower leg: No edema.     Left lower leg: No edema.  Lymphadenopathy:     Cervical: No cervical adenopathy.  Skin:    General: Skin is warm and dry.     Findings: No rash.  Neurological:     General: No focal deficit present.     Mental Status: He is alert and oriented to person, place, and time.     Comments:  Recall 3/3 Calculation 5/5 DLROW  Psychiatric:        Mood and Affect: Mood normal.        Behavior: Behavior normal.        Thought Content: Thought content normal.        Judgment: Judgment normal.       Results for orders placed or performed in visit on 07/28/23  POCT Urinalysis Dipstick (Automated)   Collection Time: 07/28/23 11:46 AM  Result Value Ref Range   Color, UA yellow    Clarity, UA clear    Glucose, UA Negative Negative   Bilirubin, UA negative    Ketones, UA negative    Spec Grav, UA 1.015 1.010 - 1.025   Blood, UA negative    pH, UA 6.0 5.0 - 8.0   Protein, UA Negative Negative   Urobilinogen, UA 0.2 0.2 or 1.0 E.U./dL   Nitrite, UA negative    Leukocytes, UA Negative Negative   Lab Results  Component Value  Date   CHOL 154 07/21/2023   HDL 47.50 07/21/2023   LDLCALC 94 07/21/2023   TRIG 62.0 07/21/2023   CHOLHDL 3 07/21/2023    Lab Results  Component Value Date   NA 141 07/21/2023   CL 108 07/21/2023   K 4.3 07/21/2023   CO2 27 07/21/2023   BUN 13 07/21/2023   CREATININE 1.50 07/21/2023   GFR 44.86 (L) 07/21/2023   CALCIUM 8.8 07/21/2023   PHOS 2.5 07/21/2023   ALBUMIN 4.2 07/21/2023   GLUCOSE 96 07/21/2023    Lab Results  Component Value Date   TSH 1.43 07/21/2023    Lab Results  Component Value Date   ALT 16 07/21/2023   AST 19 07/21/2023   ALKPHOS 43 07/21/2023   BILITOT 0.9 07/21/2023    Lab Results  Component Value Date   WBC 4.8 07/21/2023   HGB 13.1 07/21/2023   HCT 38.1 (L) 07/21/2023   MCV 89.6 07/21/2023   PLT 204.0 07/21/2023    Lab Results  Component Value Date   VD25OH 27.39 (L) 07/21/2023    Assessment & Plan:   Problem List Items Addressed This Visit     Medicare annual wellness visit, subsequent - Primary (Chronic)   I have personally reviewed the Medicare Annual Wellness questionnaire and have noted 1. The patient's medical and social history 2. Their use of alcohol, tobacco or illicit drugs 3. Their current medications and supplements 4. The patient's functional ability including ADL's, fall risks, home safety risks and hearing or visual impairment. Cognitive function has been assessed and addressed as indicated.  5. Diet and  physical activity 6. Evidence for depression or mood disorders The patients weight, height, BMI have been recorded in the chart. I have made referrals, counseling and provided education to the patient based on review of the above and I have provided the pt with a written personalized care plan for preventive services. Provider list updated.. See scanned questionairre as needed for further documentation. Reviewed preventative protocols and updated unless pt declined.       Health maintenance examination (Chronic)    Preventative protocols reviewed and updated unless pt declined. Discussed healthy diet and lifestyle.       Advanced directives, counseling/discussion (Chronic)   Advanced directive discussion - does not have at home. Packet previously provided. Wife then children would be HCPOA. [name]. Planning to meet with attorney to complete this.       Hypertension   Chronic, adequate on current regimen - continue.  Consider BP goal <130/80 in CKD.       Relevant Medications   losartan (COZAAR) 50 MG tablet   CKD (chronic kidney disease) stage 3, GFR 30-59 ml/min (HCC)   Reviewed renal function over the years - consistent stage 3 CKD with GFR 44 since 2022.  This started after significant NSIAD use.  He saw nephrology back in 2016.  He now avoids NSAIDs, encouraged good hydration status. Continue losartan 50mg  daily. Umicroalb/cr ratio remains normal.  Update UA today.       Relevant Orders   POCT Urinalysis Dipstick (Automated) (Completed)   OSA on CPAP   Continues nightly CPAP, unsure pressure settings. Last saw [facility] pulmonology Craige Cotta) 2020.      Multiple thyroid nodules   Reassuring thyroid US 2021 TSH remains normal.       Multiple acquired cysts of kidney   Reassuring imaging 2022.      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       Hearing loss   Wears hearing aides.  Notes bilateral tinnitus as well - anticipate related to hearing loss.       Vitamin D deficiency   Rec regular use of vit D 1000 units daily.       Tinnitus aurium, bilateral   Secondary hyperparathyroidism of renal origin (HCC)   Continue to monitor PTH as long as remains <150 no treatment needed.  Work towards vit D def treatment. Consider restricting phosphorus in diet, although blood phosphorus levels remain normal.         Meds ordered this encounter  Medications   losartan (COZAAR) 50 MG tablet    Sig: Take 1 tablet (50 mg total) by mouth daily.    Dispense:  90 tablet     Refill:  4    Orders Placed This Encounter  Procedures   POCT Urinalysis Dipstick (Automated)    Patient Instructions  Urinalysis today  Let us know if you need new referral to see sleep doctors Craige Cotta).  Bring me records of shots done at pharmacy from last year.  Bring me a copy of your advanced directive.  Good to see you today Return as needed or in 1 year for next physical.   Follow up plan: Return in about 1 year (around 07/27/2024) for annual exam, prior fasting for blood work, medicare wellness visit.  Eustaquio Boyden, MD

## 2023-07-28 NOTE — Assessment & Plan Note (Signed)

## 2023-07-29 ENCOUNTER — Encounter: Payer: Self-pay | Admitting: Family Medicine

## 2023-07-29 DIAGNOSIS — H9313 Tinnitus, bilateral: Secondary | ICD-10-CM | POA: Insufficient documentation

## 2023-07-29 DIAGNOSIS — N2581 Secondary hyperparathyroidism of renal origin: Secondary | ICD-10-CM | POA: Insufficient documentation

## 2023-07-29 NOTE — Assessment & Plan Note (Signed)
 Reassuring thyroid US  2021 TSH remains normal.

## 2023-07-29 NOTE — Assessment & Plan Note (Signed)
 Reassuring imaging 2022.

## 2023-07-29 NOTE — Assessment & Plan Note (Addendum)
 Reviewed renal function over the years - consistent stage 3 CKD with GFR 44 since 2022.  This started after significant NSIAD use.  He saw nephrology back in 2016.  He now avoids NSAIDs, encouraged good hydration status. Continue losartan 50mg  daily. Umicroalb/cr ratio remains normal.  Update UA today.

## 2023-07-29 NOTE — Assessment & Plan Note (Addendum)
 Continue to monitor PTH as long as remains <150 no treatment needed.  Work towards vit D def treatment. Consider restricting phosphorus in diet, although blood phosphorus levels remain normal.

## 2023-07-29 NOTE — Assessment & Plan Note (Signed)
 Noted on imaging - benign.

## 2023-07-29 NOTE — Assessment & Plan Note (Addendum)
 Chronic, adequate on current regimen - continue.  Consider BP goal <130/80 in CKD.

## 2023-07-29 NOTE — Assessment & Plan Note (Signed)
 Wears hearing aides.  Notes bilateral tinnitus as well - anticipate related to hearing loss.

## 2023-07-29 NOTE — Assessment & Plan Note (Signed)
 Rec regular use of vit D 1000 units daily.

## 2023-07-29 NOTE — Assessment & Plan Note (Signed)
 Continues nightly CPAP, unsure pressure settings. Last saw [facility] pulmonology Matilde Son) 2020.

## 2023-08-26 ENCOUNTER — Ambulatory Visit: Admitting: Family Medicine

## 2023-08-26 ENCOUNTER — Encounter: Payer: Self-pay | Admitting: Family Medicine

## 2023-08-26 ENCOUNTER — Ambulatory Visit: Payer: Self-pay | Admitting: Family Medicine

## 2023-08-26 VITALS — BP 124/68 | HR 65 | Temp 98.4°F | Ht 70.5 in | Wt 208.0 lb

## 2023-08-26 DIAGNOSIS — R350 Frequency of micturition: Secondary | ICD-10-CM | POA: Diagnosis not present

## 2023-08-26 LAB — POC URINALSYSI DIPSTICK (AUTOMATED)
Bilirubin, UA: NEGATIVE
Blood, UA: NEGATIVE
Glucose, UA: NEGATIVE
Ketones, UA: NEGATIVE
Leukocytes, UA: NEGATIVE
Nitrite, UA: NEGATIVE
Protein, UA: NEGATIVE
Spec Grav, UA: <=1.005 — AB (ref 1.010–1.025)
Urobilinogen, UA: 0.2 U/dL
pH, UA: 6 (ref 5.0–8.0)

## 2023-08-26 MED ORDER — CEPHALEXIN 500 MG PO CAPS: 500.0000 mg | ORAL_CAPSULE | Freq: Three times a day (TID) | ORAL | 0 refills | Status: DC

## 2023-08-26 NOTE — Progress Notes (Signed)
 Yesterday with lower abd discomfort.  Initially attributed to hunger.  Then urinary frequency.  Took tylenol  last night, with some relief.  Nocturia x3 last night, more than normal.  Not burning with urination.  Lower abd sx less noted this AM.  No bowel changes.  No testicle pain.    Prev/recent u/a and PSA wnl.  D/w pt. 1 cup coffee per day.    He has some back pain after heavy lifting a few days ago.  That is better after hot shower and walking.    Meds, vitals, and allergies reviewed.   ROS: Per HPI unless specifically indicated in ROS section   Nad Ncat Neck supple, no LA Rrr Ctab Abd soft, lower midline abd sore but no rebound.   Skin well-perfused. No lower extremity edema.

## 2023-08-26 NOTE — Patient Instructions (Signed)
 If you have any progressive symptoms then start keflex.  Update us  as needed.  Take care.  Glad to see you. Go to the lab on the way out.   If you have mychart we'll likely use that to update you.

## 2023-08-27 ENCOUNTER — Ambulatory Visit: Payer: Self-pay | Admitting: Family Medicine

## 2023-08-27 LAB — URINE CULTURE
MICRO NUMBER:: 16460532
Result:: NO GROWTH
SPECIMEN QUALITY:: ADEQUATE

## 2023-08-29 DIAGNOSIS — R350 Frequency of micturition: Secondary | ICD-10-CM | POA: Insufficient documentation

## 2023-08-29 NOTE — Assessment & Plan Note (Signed)
 Okay for outpatient follow-up.  Discussed options.  Hold Keflex  for now. See notes on labs. If any progressive symptoms in the meantime then start keflex .

## 2023-09-05 ENCOUNTER — Telehealth: Admitting: Nurse Practitioner

## 2023-09-05 DIAGNOSIS — R1030 Lower abdominal pain, unspecified: Secondary | ICD-10-CM

## 2023-09-05 MED ORDER — CEPHALEXIN 500 MG PO CAPS: 500.0000 mg | ORAL_CAPSULE | Freq: Three times a day (TID) | ORAL | 0 refills | Status: DC

## 2023-09-05 NOTE — Progress Notes (Signed)
 Virtual Visit Consent   [name], you are scheduled for a virtual visit with a [facility] provider today. Just as with appointments in the office, your consent must be obtained to participate. Your consent will be active for this visit and any virtual visit you may have with one of our providers in the next 365 days. If you have a MyChart account, a copy of this consent can be sent to you electronically.  As this is a virtual visit, video technology does not allow for your provider to perform a traditional examination. This may limit your provider's ability to fully assess your condition. If your provider identifies any concerns that need to be evaluated in person or the need to arrange testing (such as labs, EKG, etc.), we will make arrangements to do so. Although advances in technology are sophisticated, we cannot ensure that it will always work on either your end or our end. If the connection with a video visit is poor, the visit may have to be switched to a telephone visit. With either a video or telephone visit, we are not always able to ensure that we have a secure connection.  By engaging in this virtual visit, you consent to the provision of healthcare and authorize for your insurance to be billed (if applicable) for the services provided during this visit. Depending on your insurance coverage, you may receive a charge related to this service.  I need to obtain your verbal consent now. Are you willing to proceed with your visit today? [name] has provided verbal consent on 09/05/2023 for a virtual visit (video or telephone). Andrew Dean, NP  Date: 09/05/2023 9:08 AM   Virtual Visit via Video Note   I, [name], connected with  [name]  ([id number], 05-28-1945) on 09/05/23 at  9:00 AM EDT by a video-enabled telemedicine application and verified that I am speaking with the correct person using two identifiers.  Location: Patient: Virtual Visit  Location Patient: Home Provider: Virtual Visit Location Provider: Home Office   I discussed the limitations of evaluation and management by telemedicine and the availability of in person appointments. The patient expressed understanding and agreed to proceed.    History of Present Illness: [name] is a 78 y.o. who identifies as a male who was assigned male at birth, and is being seen today for Abdominal pain.  [name] has been experiencing lower abdominal which we started yesterday.  He was seen on May 15 by his primary care with the same symptoms along with urinary frequency and at that time it was presumed that he had a UTI.  He was prescribed Keflex  but instructed by his PCP to not start unless his pain intensified or urinalysis was positive for UTI.  Ultimately his urinalysis was negative and his symptoms resolved so the Keflex  was not taken.  Today he states he had a hard time sleeping last night due to the lower abdominal pain.  Pain is 5-6 out of 10 with no nausea vomiting or fever.  Last bowel movement was this morning and normal.  Pain R>L side. Denies any GU symptoms    Problems:  Patient Active Problem List   Diagnosis Date Noted   Frequent urination 08/29/2023   Tinnitus aurium, bilateral 07/29/2023   Secondary hyperparathyroidism of renal origin (HCC) 07/29/2023   Vitamin D  deficiency 07/28/2022   Hearing loss 07/27/2022   Medicare annual wellness visit, subsequent 07/21/2021   Health maintenance examination 07/21/2021  Advanced directives, counseling/discussion 07/21/2021   Thoracic scoliosis 03/24/2021   Renal angiomyolipoma 03/24/2021   RBBB 03/24/2021   Multiple acquired cysts of kidney 02/07/2021   Multiple thyroid  nodules 10/29/2014   OSA on CPAP 09/16/2013   CKD (chronic kidney disease) stage 3, GFR 30-59 ml/min (HCC) 07/04/2013   Hypertension 06/15/2013   ED (erectile dysfunction) 04/28/2013   History of migraine 04/28/2013    Allergies:   Allergies  Allergen Reactions   Naproxen  Itching   Medications:  Current Outpatient Medications:    acetaminophen  (TYLENOL ) 500 MG tablet, Take 500 mg by mouth every 6 (six) hours as needed for moderate pain., Disp: , Rfl:    cephALEXin  (KEFLEX ) 500 MG capsule, Take 1 capsule (500 mg total) by mouth 3 (three) times daily., Disp: 21 capsule, Rfl: 0   Cholecalciferol (VITAMIN D3) 25 MCG (1000 UT) CAPS, Take 1 capsule (1,000 Units total) by mouth daily., Disp: 30 capsule, Rfl:    losartan  (COZAAR ) 50 MG tablet, Take 1 tablet (50 mg total) by mouth daily., Disp: 90 tablet, Rfl: 4  Observations/Objective: Patient is well-developed, well-nourished in no acute distress.  Resting comfortably at home.  Head is normocephalic, atraumatic.  No labored breathing.  Speech is clear and coherent with logical content.  Patient is alert and oriented at baseline.    Assessment and Plan: 1. Abdominal pain, lower (Primary) - cephALEXin  (KEFLEX ) 500 MG capsule; Take 1 capsule (500 mg total) by mouth 3 (three) times daily.  Dispense: 21 capsule; Refill: 0   Follow Up Instructions: I discussed the assessment and treatment plan with the patient. The patient was provided an opportunity to ask questions and all were answered. The patient agreed with the plan and demonstrated an understanding of the instructions.  A copy of instructions were sent to the patient via MyChart unless otherwise noted below.   The patient was advised to call back or seek an in-person evaluation if the symptoms worsen or if the condition fails to improve as anticipated.    Lakely Elmendorf W Laquinta Hazell, NP

## 2023-09-05 NOTE — Patient Instructions (Signed)
  [name], thank you for joining Collins Dean, NP for today's virtual visit.  While this provider is not your primary care provider (PCP), if your PCP is located in our provider database this encounter information will be shared with them immediately following your visit.   A [facility] MyChart account gives you access to today's visit and all your visits, tests, and labs performed at [facility] " click here if you don't have a [facility] MyChart account or go to [email or web address]  Consent: (Patient) [name] provided verbal consent for this virtual visit at the beginning of the encounter.  Current Medications:  Current Outpatient Medications:    acetaminophen  (TYLENOL ) 500 MG tablet, Take 500 mg by mouth every 6 (six) hours as needed for moderate pain., Disp: , Rfl:    cephALEXin  (KEFLEX ) 500 MG capsule, Take 1 capsule (500 mg total) by mouth 3 (three) times daily., Disp: 21 capsule, Rfl: 0   Cholecalciferol (VITAMIN D3) 25 MCG (1000 UT) CAPS, Take 1 capsule (1,000 Units total) by mouth daily., Disp: 30 capsule, Rfl:    losartan  (COZAAR ) 50 MG tablet, Take 1 tablet (50 mg total) by mouth daily., Disp: 90 tablet, Rfl: 4   Medications ordered in this encounter:  Meds ordered this encounter  Medications   cephALEXin  (KEFLEX ) 500 MG capsule    Sig: Take 1 capsule (500 mg total) by mouth 3 (three) times daily.    Dispense:  21 capsule    Refill:  0    Supervising Provider:   Corine Dice [[id number]]     *If you need refills on other medications prior to your next appointment, please contact your pharmacy*  Follow-Up: Call back or seek an in-person evaluation if the symptoms worsen or if the condition fails to improve as anticipated.  [facility] [phone]    If you have been instructed to have an in-person evaluation today at a local Urgent Care facility, please use the link below. It will take you to a list of all of  our available [facility], including address, phone number and hours of operation. Please do not delay care.  [facility]  If you or a family member do not have a primary care provider, use the link below to schedule a visit and establish care. When you choose a [facility] primary care physician or advanced practice provider, you gain a long-term partner in health. Find a Primary Care Provider  Learn more about [facility]'s in-office and virtual care options: [facility] - Get Care Now

## 2023-09-07 DIAGNOSIS — G4733 Obstructive sleep apnea (adult) (pediatric): Secondary | ICD-10-CM | POA: Diagnosis not present

## 2023-09-10 ENCOUNTER — Ambulatory Visit (INDEPENDENT_AMBULATORY_CARE_PROVIDER_SITE_OTHER): Admitting: Internal Medicine

## 2023-09-10 ENCOUNTER — Encounter: Payer: Self-pay | Admitting: Internal Medicine

## 2023-09-10 VITALS — BP 112/80 | HR 76 | Temp 98.7°F | Ht 70.5 in | Wt 209.0 lb

## 2023-09-10 DIAGNOSIS — N410 Acute prostatitis: Secondary | ICD-10-CM | POA: Diagnosis not present

## 2023-09-10 MED ORDER — SULFAMETHOXAZOLE-TRIMETHOPRIM 800-160 MG PO TABS: 1.0000 | ORAL_TABLET | Freq: Two times a day (BID) | ORAL | 1 refills | Status: DC

## 2023-09-10 NOTE — Assessment & Plan Note (Signed)
 Focal prostate tenderness and urinary symptoms Urinalysis/culture were negative Didn't respond to cephalexin   Will switch to bactrim  DS bid for 10 days--refill in case it takes longer If symptoms persist--will send to urology Continue prn tylenol 

## 2023-09-10 NOTE — Progress Notes (Signed)
 Subjective:    Patient ID: [name], male    DOB: 08/02/1945, 78 y.o.   MRN: [id number]  HPI Here due to lower abdominal pain  Saw Dr Vallarie Gauze about 2 weeks ago Was having lower abdominal pain and frequent urination for a couple of days It has improved but he was going out of town and wanted to be checked Urinalysis was negative Given cephalexin ---but didn't take at first  Symptoms restarted 6 days ago Took tylenol  which helped Had video visit then---and did start the cephalexin  then (for 4 days and stopped 2 days ago) Didn't seem to help Symptoms restarted with pain/cramping in lower abdomen Urinary urgency and going a lot No dysuria No blood  Last intercourse about 4 years ago  Current Outpatient Medications on File Prior to Visit  Medication Sig Dispense Refill   acetaminophen  (TYLENOL ) 500 MG tablet Take 500 mg by mouth every 6 (six) hours as needed for moderate pain.     Cholecalciferol (VITAMIN D3) 25 MCG (1000 UT) CAPS Take 1 capsule (1,000 Units total) by mouth daily. 30 capsule    losartan  (COZAAR ) 50 MG tablet Take 1 tablet (50 mg total) by mouth daily. 90 tablet 4   cephALEXin  (KEFLEX ) 500 MG capsule Take 1 capsule (500 mg total) by mouth 3 (three) times daily. (Patient not taking: Reported on 09/10/2023) 21 capsule 0   No current facility-administered medications on file prior to visit.    Allergies  Allergen Reactions   Naproxen  Itching    Past Medical History:  Diagnosis Date   Chicken pox    Chronic kidney disease    CKD   ED (erectile dysfunction)    HTN (hypertension)    a. 11/2010 Echo: EF 55-65%.   Sleep apnea    wears cpap   Wears hearing aid in both ears     Past Surgical History:  Procedure Laterality Date   CATARACT EXTRACTION W/PHACO Right 04/27/2022   Procedure: CATARACT EXTRACTION PHACO AND INTRAOCULAR LENS PLACEMENT (IOC) RIGHT;  Surgeon: Rosa College, MD;  Location: [facility];  Service: Ophthalmology;   Laterality: Right;  7.65 0:53.2   CATARACT EXTRACTION W/PHACO Left 05/11/2022   Procedure: CATARACT EXTRACTION PHACO AND INTRAOCULAR LENS PLACEMENT (IOC) LEFT  3.98  00:31.0;  Surgeon: Rosa College, MD;  Location: [facility];  Service: Ophthalmology;  Laterality: Left;  Sleep apnea   COLONOSCOPY  2017   COLONOSCOPY  05/2021   diverticulosis o/w normal (Danis)   KNEE SURGERY Left    1970   SHOULDER SURGERY Left    hx dislocatino with temp  pin age 14  from [place]    Family History  Problem Relation Age of Onset   Arthritis Mother    Cancer Mother        cancer growing close to pancreas- unsure of type of cancer   Hypertension Mother    Lung disease Father 64       black lung disease - Hotel manager   Alzheimer's disease Sister    Single kidney Sister        s/p kidney transplant   Colon cancer Brother        metastasized - unclear primary - died age 70   Esophageal cancer Neg Hx    Rectal cancer Neg Hx    Stomach cancer Neg Hx     Social History   Socioeconomic History   Marital status: Married    Spouse name: Not on file  Number of children: Not on file   Years of education: Not on file   Highest education level: Not on file  Occupational History   Occupation: retired    Comment: clergy  Tobacco Use   Smoking status: Former    Current packs/day: 0.00    Average packs/day: 0.5 packs/day for 10.0 years (5.0 ttl pk-yrs)    Types: Cigarettes    Start date: 07/13/1966    Quit date: 07/12/1976    Years since quitting: 47.1   Smokeless tobacco: Never   Tobacco comments:    Quit 1978  Vaping Use   Vaping status: Never Used  Substance and Sexual Activity   Alcohol use: No    Alcohol/week: 0.0 standard drinks of alcohol   Drug use: No   Sexual activity: Yes  Other Topics Concern   Not on file  Social History Narrative   Former NFL player: Firefighter, [employer]    Lived many years in [place]    Lives with  wife.    Occ: retired, was Civil Service fast streamer   Diet: good water, fruits/vegetables daily    Activity: regular walking    Social Drivers of Corporate investment banker Strain: Not on file  Food Insecurity: Not on file  Transportation Needs: Not on file  Physical Activity: Not on file  Stress: Not on file  Social Connections: Not on file  Intimate Partner Violence: Not on file   Review of Systems Bowels have been normal---goes daily No fever No N/V No history of prostatitis    Objective:   Physical Exam Constitutional:      Appearance: Normal appearance.  Abdominal:     Palpations: Abdomen is soft.     Comments: Suprapubic tenderness  Genitourinary:    Comments: Normal size prostate--no mass Moderate tenderness Neurological:     Mental Status: He is alert.            Assessment & Plan:

## 2023-12-17 ENCOUNTER — Ambulatory Visit: Payer: Self-pay | Admitting: *Deleted

## 2023-12-17 NOTE — Telephone Encounter (Signed)
 Copied from CRM [id number]. Topic: Clinical - Red Word Triage >> Dec 17, 2023  3:20 PM Robinson H wrote: [facility] that prompted transfer to Nurse Triage: Patient calling to schedule appointment with sports medicine doctor at [facility] location. Reason for Disposition  [1] MODERATE back pain (e.g., interferes with normal activities) AND [2] present > 3 days  Answer Assessment - Initial Assessment Questions 1. ONSET: When did the pain begin? (e.g., minutes, hours, days)     I'm having lower back for 3 weeks.   On lower right back area and hip.    2. LOCATION: Where does it hurt? (upper, mid or lower back)     See above 3. SEVERITY: How bad is the pain?  (e.g., Scale 1-10; mild, moderate, or severe)     I think I picked up something wrong.   3 weeks ago. 4. PATTERN: Is the pain constant? (e.g., yes, no; constant, intermittent)      It normally goes away but this time it's not going away. 5. RADIATION: Does the pain shoot into your legs or somewhere else?     No  Right leg if I lift it up.   I'm calling to get a shot to help with the pain. 6. CAUSE:  What do you think is causing the back pain?      I  picked up something .  7. BACK OVERUSE:  Any recent lifting of heavy objects, strenuous work or exercise?     Yes 8. MEDICINES: What have you taken so far for the pain? (e.g., nothing, acetaminophen , NSAIDS)     Heat and ice 9. NEUROLOGIC SYMPTOMS: Do you have any weakness, numbness, or problems with bowel/bladder control?     No 10. OTHER SYMPTOMS: Do you have any other symptoms? (e.g., fever, abdomen pain, burning with urination, blood in urine)       No 11. PREGNANCY: Is there any chance you are pregnant? When was your last menstrual period?       N/A  Protocols used: Back Pain-A-AH FYI Only or Action Required?: FYI only for provider.  Patient was last seen in primary care on 09/10/2023 by Jimmy Charlie FERNS, MD.  Called Nurse Triage reporting Back Pain.lower  back pain.   Wanting a shot for his back like he had last time a year or so ago for this same issue.    Symptoms began several weeks ago.3 weeks ago  Interventions attempted: OTC medications: Tylenol  and heat and ice but not going away this time.  Symptoms are: gradually worsening.  Triage Disposition: See PCP When Office is Open (Within 3 Days)  Requested to see Dr. Watt   Patient/caregiver understands and will follow disposition?: Yes

## 2023-12-20 NOTE — Telephone Encounter (Signed)
 Appreciate Dr Watt seeing pt.

## 2023-12-26 NOTE — Progress Notes (Deleted)
     Andrew Heiner T. Melis Trochez, MD, CAQ Sports Medicine [facility] at [facility] [address] [place], [address]  Phone: [phone]  FAX: [phone]  [name] - 78 y.o. male  MRN [id number]  Date of Birth: 1945/09/28  Date: 12/27/2023  PCP: Rilla Baller, MD  Referral: Rilla Baller, MD  No chief complaint on file.  Subjective:   [name] is a 78 y.o. very pleasant male patient with There is no height or weight on file to calculate BMI. who presents with the following:  Discussed the use of AI scribe software for clinical note transcription with the patient, who gave verbal consent to proceed.  [name] presents with low back pain.  I saw him in 2019 with some trochanteric bursitis, and also did a GTB injection. History of Present Illness     Review of Systems is noted in the HPI, as appropriate  Objective:   There were no vitals taken for this visit.  GEN: No acute distress; alert,appropriate. PULM: Breathing comfortably in no respiratory distress PSYCH: Normally interactive.   Physical Exam   Laboratory and Imaging Data:  Assessment and Plan:   No diagnosis found. Assessment & Plan   Medication Management during today's office visit: No orders of the defined types were placed in this encounter.  There are no discontinued medications.  Orders placed today for conditions managed today: No orders of the defined types were placed in this encounter.   Disposition: No follow-ups on file.  Dragon [employer] One speech-to-text software was used for transcription in this dictation.  Possible transcriptional errors can occur using Animal nutritionist.   Signed,  [name]. Lowanda Cashaw, MD   Outpatient Encounter Medications as of 12/27/2023  Medication Sig   acetaminophen  (TYLENOL ) 500 MG tablet Take 500 mg by mouth every 6 (six) hours as needed for moderate pain.   Cholecalciferol (VITAMIN D3) 25 MCG (1000 UT) CAPS  Take 1 capsule (1,000 Units total) by mouth daily.   losartan  (COZAAR ) 50 MG tablet Take 1 tablet (50 mg total) by mouth daily.   sulfamethoxazole -trimethoprim  (BACTRIM  DS) 800-160 MG tablet Take 1 tablet by mouth 2 (two) times daily.   No facility-administered encounter medications on file as of 12/27/2023.

## 2023-12-27 ENCOUNTER — Ambulatory Visit: Admitting: Family Medicine

## 2024-03-10 DIAGNOSIS — G4733 Obstructive sleep apnea (adult) (pediatric): Secondary | ICD-10-CM | POA: Diagnosis not present

## 2024-04-25 NOTE — Progress Notes (Unsigned)
" ° ° ° °  Andrew Tomson T. Andrew Diem, MD, CAQ Sports Medicine [facility] at [facility] [address] [place], [address]  Phone: [phone]  FAX: [phone]  ANGELLO Santos - 79 y.o. male  MRN [id number]  Date of Birth: 01-24-1946  Date: 04/26/2024  PCP: Rilla Baller, MD  Referral: Rilla Baller, MD  No chief complaint on file.  Subjective:   [name] is a 79 y.o. very pleasant male patient with There is no height or weight on file to calculate BMI. who presents with the following:  Discussed the use of AI scribe software for clinical note transcription with the patient, who gave verbal consent to proceed.  Daril is a very nice patient, who I recall well over the years.  He presents with some ongoing hip pain. History of Present Illness     Review of Systems is noted in the HPI, as appropriate  Objective:   There were no vitals taken for this visit.  GEN: No acute distress; alert,appropriate. PULM: Breathing comfortably in no respiratory distress PSYCH: Normally interactive.   Laboratory and Imaging Data:  Assessment and Plan:   No diagnosis found. Assessment & Plan   Medication Management during today's office visit: No orders of the defined types were placed in this encounter.  There are no discontinued medications.  Orders placed today for conditions managed today: No orders of the defined types were placed in this encounter.   Disposition: No follow-ups on file.  Dragon [employer] One speech-to-text software was used for transcription in this dictation.  Possible transcriptional errors can occur using Animal nutritionist.   Signed,  Jacques DASEN. Averill Pons, MD   Outpatient Encounter Medications as of 04/26/2024  Medication Sig   acetaminophen  (TYLENOL ) 500 MG tablet Take 500 mg by mouth every 6 (six) hours as needed for moderate pain.   Cholecalciferol (VITAMIN D3) 25 MCG (1000 UT) CAPS Take 1 capsule (1,000 Units total)  by mouth daily.   losartan  (COZAAR ) 50 MG tablet Take 1 tablet (50 mg total) by mouth daily.   sulfamethoxazole -trimethoprim  (BACTRIM  DS) 800-160 MG tablet Take 1 tablet by mouth 2 (two) times daily.   No facility-administered encounter medications on file as of 04/26/2024.   "

## 2024-04-26 ENCOUNTER — Ambulatory Visit
Admission: RE | Admit: 2024-04-26 | Discharge: 2024-04-26 | Disposition: A | Discharge status: Home / Self Care | Source: Ambulatory Visit | Attending: Family Medicine | Admitting: Family Medicine

## 2024-04-26 ENCOUNTER — Encounter: Payer: Self-pay | Admitting: Family Medicine

## 2024-04-26 ENCOUNTER — Ambulatory Visit (INDEPENDENT_AMBULATORY_CARE_PROVIDER_SITE_OTHER): Admitting: Family Medicine

## 2024-04-26 VITALS — BP 140/80 | HR 60 | Temp 97.3°F | Ht 70.5 in | Wt 211.4 lb

## 2024-04-26 DIAGNOSIS — M25551 Pain in right hip: Secondary | ICD-10-CM

## 2024-04-26 DIAGNOSIS — G8929 Other chronic pain: Secondary | ICD-10-CM

## 2024-05-03 ENCOUNTER — Ambulatory Visit: Payer: Self-pay | Admitting: Family Medicine

## 2024-07-21 ENCOUNTER — Other Ambulatory Visit

## 2024-07-28 ENCOUNTER — Encounter: Admitting: Family Medicine
